# Patient Record
Sex: Female | Born: 1990 | Hispanic: Yes | Marital: Married | Smoking: Never smoker
Health system: Southern US, Community
[De-identification: ages and names within clinical notes are randomized; demographics above are authoritative.]

## PROBLEM LIST (undated history)

## (undated) ENCOUNTER — Inpatient Hospital Stay (HOSPITAL_COMMUNITY): Payer: Self-pay

## (undated) DIAGNOSIS — R519 Headache, unspecified: Secondary | ICD-10-CM

## (undated) DIAGNOSIS — K219 Gastro-esophageal reflux disease without esophagitis: Secondary | ICD-10-CM

## (undated) DIAGNOSIS — O24419 Gestational diabetes mellitus in pregnancy, unspecified control: Secondary | ICD-10-CM

## (undated) DIAGNOSIS — D219 Benign neoplasm of connective and other soft tissue, unspecified: Secondary | ICD-10-CM

## (undated) DIAGNOSIS — R51 Headache: Secondary | ICD-10-CM

## (undated) HISTORY — PX: NO PAST SURGERIES: SHX2092

---
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
## 2008-01-24 ENCOUNTER — Emergency Department (HOSPITAL_COMMUNITY): Admission: EM | Admit: 2008-01-24 | Discharge: 2008-01-24 | Payer: Self-pay | Admitting: Family Medicine

## 2008-11-18 ENCOUNTER — Emergency Department (HOSPITAL_COMMUNITY): Admission: EM | Admit: 2008-11-18 | Discharge: 2008-11-18 | Payer: Self-pay | Admitting: Emergency Medicine

## 2008-11-18 ENCOUNTER — Emergency Department (HOSPITAL_COMMUNITY): Admission: EM | Admit: 2008-11-18 | Discharge: 2008-11-19 | Payer: Self-pay | Admitting: Emergency Medicine

## 2009-02-03 ENCOUNTER — Emergency Department (HOSPITAL_COMMUNITY): Admission: EM | Admit: 2009-02-03 | Discharge: 2009-02-03 | Payer: Self-pay | Admitting: Emergency Medicine

## 2009-03-30 ENCOUNTER — Emergency Department (HOSPITAL_COMMUNITY): Admission: EM | Admit: 2009-03-30 | Discharge: 2009-03-30 | Payer: Self-pay | Admitting: Emergency Medicine

## 2009-10-18 ENCOUNTER — Inpatient Hospital Stay (HOSPITAL_COMMUNITY): Admission: AD | Admit: 2009-10-18 | Discharge: 2009-10-19 | Payer: Self-pay | Admitting: Obstetrics and Gynecology

## 2009-10-18 ENCOUNTER — Ambulatory Visit: Payer: Self-pay | Admitting: Obstetrics and Gynecology

## 2010-01-14 ENCOUNTER — Emergency Department (HOSPITAL_COMMUNITY): Admission: EM | Admit: 2010-01-14 | Discharge: 2010-01-14 | Payer: Self-pay | Admitting: Family Medicine

## 2010-05-25 ENCOUNTER — Inpatient Hospital Stay (INDEPENDENT_AMBULATORY_CARE_PROVIDER_SITE_OTHER)
Admission: RE | Admit: 2010-05-25 | Discharge: 2010-05-25 | Disposition: A | Discharge status: Home / Self Care | Payer: Self-pay | Source: Ambulatory Visit | Attending: Family Medicine | Admitting: Family Medicine

## 2010-05-25 DIAGNOSIS — J029 Acute pharyngitis, unspecified: Secondary | ICD-10-CM

## 2010-06-25 LAB — POCT PREGNANCY, URINE: Preg Test, Ur: NEGATIVE

## 2010-06-28 LAB — POCT PREGNANCY, URINE: Preg Test, Ur: NEGATIVE

## 2010-06-28 LAB — URINALYSIS, ROUTINE W REFLEX MICROSCOPIC
Glucose, UA: NEGATIVE mg/dL
Ketones, ur: NEGATIVE mg/dL
Leukocytes, UA: NEGATIVE
Protein, ur: NEGATIVE mg/dL

## 2010-06-28 LAB — GC/CHLAMYDIA PROBE AMP, GENITAL
Chlamydia, DNA Probe: NEGATIVE
GC Probe Amp, Genital: NEGATIVE

## 2010-06-28 LAB — CBC
MCH: 32.7 pg (ref 26.0–34.0)
MCHC: 34.4 g/dL (ref 30.0–36.0)
MCV: 95.2 fL (ref 78.0–100.0)
Platelets: 192 10*3/uL (ref 150–400)

## 2010-06-28 LAB — URINE MICROSCOPIC-ADD ON

## 2010-06-28 LAB — WET PREP, GENITAL
Trich, Wet Prep: NONE SEEN
Yeast Wet Prep HPF POC: NONE SEEN

## 2010-07-16 LAB — POCT I-STAT, CHEM 8
Calcium, Ion: 1.14 mmol/L (ref 1.12–1.32)
Creatinine, Ser: 0.4 mg/dL (ref 0.4–1.2)
Glucose, Bld: 104 mg/dL — ABNORMAL HIGH (ref 70–99)
HCT: 45 % (ref 36.0–46.0)
Hemoglobin: 15.3 g/dL — ABNORMAL HIGH (ref 12.0–15.0)

## 2010-07-18 LAB — URINALYSIS, ROUTINE W REFLEX MICROSCOPIC
Glucose, UA: NEGATIVE mg/dL
Hgb urine dipstick: NEGATIVE
Protein, ur: NEGATIVE mg/dL
Urobilinogen, UA: 2 mg/dL — ABNORMAL HIGH (ref 0.0–1.0)

## 2010-07-18 LAB — COMPREHENSIVE METABOLIC PANEL
ALT: 20 U/L (ref 0–35)
AST: 12 U/L (ref 0–37)
Albumin: 3.9 g/dL (ref 3.5–5.2)
Alkaline Phosphatase: 80 U/L (ref 39–117)
BUN: 16 mg/dL (ref 6–23)
Chloride: 108 mEq/L (ref 96–112)
Potassium: 3.6 mEq/L (ref 3.5–5.1)
Sodium: 139 mEq/L (ref 135–145)
Total Bilirubin: 1.2 mg/dL (ref 0.3–1.2)
Total Protein: 6.7 g/dL (ref 6.0–8.3)

## 2010-07-18 LAB — DIFFERENTIAL
Basophils Absolute: 0 10*3/uL (ref 0.0–0.1)
Basophils Relative: 0 % (ref 0–1)
Eosinophils Absolute: 0 10*3/uL (ref 0.0–0.7)
Eosinophils Relative: 1 % (ref 0–5)
Monocytes Absolute: 0.4 10*3/uL (ref 0.1–1.0)
Monocytes Relative: 4 % (ref 3–12)
Neutro Abs: 8.4 10*3/uL — ABNORMAL HIGH (ref 1.7–7.7)

## 2010-07-18 LAB — CBC
HCT: 40.9 % (ref 36.0–46.0)
Platelets: 143 10*3/uL — ABNORMAL LOW (ref 150–400)
RDW: 12.8 % (ref 11.5–15.5)
WBC: 9.5 10*3/uL (ref 4.0–10.5)

## 2010-07-18 LAB — GC/CHLAMYDIA PROBE AMP, GENITAL: Chlamydia, DNA Probe: NEGATIVE

## 2010-07-18 LAB — POCT URINALYSIS DIP (DEVICE)
Hgb urine dipstick: NEGATIVE
Ketones, ur: NEGATIVE mg/dL
Nitrite: NEGATIVE
Protein, ur: NEGATIVE mg/dL
pH: 5.5 (ref 5.0–8.0)

## 2010-07-18 LAB — WET PREP, GENITAL
Clue Cells Wet Prep HPF POC: NONE SEEN
WBC, Wet Prep HPF POC: NONE SEEN
Yeast Wet Prep HPF POC: NONE SEEN

## 2010-08-31 ENCOUNTER — Inpatient Hospital Stay (HOSPITAL_COMMUNITY)
Admission: AD | Admit: 2010-08-31 | Discharge: 2010-08-31 | Disposition: A | Discharge status: Home / Self Care | Payer: Self-pay | Source: Ambulatory Visit | Attending: Obstetrics and Gynecology | Admitting: Obstetrics and Gynecology

## 2010-08-31 DIAGNOSIS — R109 Unspecified abdominal pain: Secondary | ICD-10-CM | POA: Insufficient documentation

## 2010-08-31 LAB — URINALYSIS, ROUTINE W REFLEX MICROSCOPIC
Bilirubin Urine: NEGATIVE
Glucose, UA: NEGATIVE mg/dL
Nitrite: NEGATIVE
Specific Gravity, Urine: 1.02 (ref 1.005–1.030)
pH: 8.5 — ABNORMAL HIGH (ref 5.0–8.0)

## 2010-08-31 LAB — POCT PREGNANCY, URINE: Preg Test, Ur: NEGATIVE

## 2010-09-01 ENCOUNTER — Inpatient Hospital Stay (HOSPITAL_COMMUNITY): Payer: Self-pay

## 2010-09-01 ENCOUNTER — Inpatient Hospital Stay (HOSPITAL_COMMUNITY)
Admission: AD | Admit: 2010-09-01 | Discharge: 2010-09-01 | Disposition: A | Discharge status: Home / Self Care | Payer: Self-pay | Source: Ambulatory Visit | Attending: Obstetrics & Gynecology | Admitting: Obstetrics & Gynecology

## 2010-09-01 DIAGNOSIS — B3731 Acute candidiasis of vulva and vagina: Secondary | ICD-10-CM | POA: Insufficient documentation

## 2010-09-01 DIAGNOSIS — R109 Unspecified abdominal pain: Secondary | ICD-10-CM | POA: Insufficient documentation

## 2010-09-01 DIAGNOSIS — B373 Candidiasis of vulva and vagina: Secondary | ICD-10-CM | POA: Insufficient documentation

## 2010-09-01 LAB — WET PREP, GENITAL
Clue Cells Wet Prep HPF POC: NONE SEEN
Trich, Wet Prep: NONE SEEN

## 2010-09-01 LAB — DIFFERENTIAL
Basophils Absolute: 0 10*3/uL (ref 0.0–0.1)
Basophils Relative: 0 % (ref 0–1)
Eosinophils Relative: 4 % (ref 0–5)
Monocytes Absolute: 0.5 10*3/uL (ref 0.1–1.0)
Neutro Abs: 4.1 10*3/uL (ref 1.7–7.7)

## 2010-09-01 LAB — CBC
MCHC: 33.6 g/dL (ref 30.0–36.0)
RDW: 13.4 % (ref 11.5–15.5)
WBC: 7.1 10*3/uL (ref 4.0–10.5)

## 2010-09-02 LAB — GC/CHLAMYDIA PROBE AMP, GENITAL
Chlamydia, DNA Probe: NEGATIVE
GC Probe Amp, Genital: NEGATIVE

## 2010-10-24 ENCOUNTER — Inpatient Hospital Stay (INDEPENDENT_AMBULATORY_CARE_PROVIDER_SITE_OTHER)
Admission: RE | Admit: 2010-10-24 | Discharge: 2010-10-24 | Disposition: A | Discharge status: Home / Self Care | Payer: Self-pay | Source: Ambulatory Visit | Attending: Emergency Medicine | Admitting: Emergency Medicine

## 2010-10-24 DIAGNOSIS — N390 Urinary tract infection, site not specified: Secondary | ICD-10-CM

## 2010-10-24 LAB — WET PREP, GENITAL

## 2010-10-24 LAB — POCT URINALYSIS DIP (DEVICE)
Nitrite: NEGATIVE
Protein, ur: 30 mg/dL — AB
Urobilinogen, UA: 1 mg/dL (ref 0.0–1.0)

## 2010-10-26 LAB — GC/CHLAMYDIA PROBE AMP, GENITAL
Chlamydia, DNA Probe: NEGATIVE
GC Probe Amp, Genital: NEGATIVE

## 2010-10-30 ENCOUNTER — Emergency Department (HOSPITAL_COMMUNITY)
Admission: EM | Admit: 2010-10-30 | Discharge: 2010-10-30 | Disposition: A | Discharge status: Home / Self Care | Payer: Self-pay | Attending: Emergency Medicine | Admitting: Emergency Medicine

## 2010-10-30 DIAGNOSIS — R109 Unspecified abdominal pain: Secondary | ICD-10-CM | POA: Insufficient documentation

## 2010-10-30 DIAGNOSIS — N390 Urinary tract infection, site not specified: Secondary | ICD-10-CM | POA: Insufficient documentation

## 2010-10-30 DIAGNOSIS — N898 Other specified noninflammatory disorders of vagina: Secondary | ICD-10-CM | POA: Insufficient documentation

## 2010-10-30 DIAGNOSIS — R300 Dysuria: Secondary | ICD-10-CM | POA: Insufficient documentation

## 2010-10-30 LAB — URINALYSIS, ROUTINE W REFLEX MICROSCOPIC
Glucose, UA: NEGATIVE mg/dL
Ketones, ur: NEGATIVE mg/dL
Nitrite: POSITIVE — AB
Protein, ur: 30 mg/dL — AB
Urobilinogen, UA: 1 mg/dL (ref 0.0–1.0)

## 2010-10-30 LAB — URINE MICROSCOPIC-ADD ON

## 2010-10-30 LAB — POCT PREGNANCY, URINE: Preg Test, Ur: NEGATIVE

## 2011-01-01 LAB — COMPREHENSIVE METABOLIC PANEL
ALT: 37 — ABNORMAL HIGH
AST: 20
CO2: 28
Chloride: 107
GFR calc Af Amer: >60
GFR calc non Af Amer: >60
Glucose, Bld: 101 — ABNORMAL HIGH
Sodium: 139
Total Bilirubin: 0.6

## 2011-01-01 LAB — DIFFERENTIAL
Basophils Absolute: 0
Basophils Relative: 1
Eosinophils Absolute: 0.3
Eosinophils Relative: 4
Neutrophils Relative %: 62

## 2011-01-01 LAB — POCT URINALYSIS DIP (DEVICE)
Bilirubin Urine: NEGATIVE
Glucose, UA: NEGATIVE
Hgb urine dipstick: NEGATIVE
Operator id: 235561
Specific Gravity, Urine: 1.015
Urobilinogen, UA: 0.2

## 2011-01-01 LAB — CBC
Hemoglobin: 14.7
MCHC: 34.3
MCV: 88.5
RBC: 4.84
WBC: 9.1

## 2011-01-07 LAB — POCT RAPID STREP A: Streptococcus, Group A Screen (Direct): NEGATIVE

## 2011-01-25 LAB — POCT URINALYSIS DIP (DEVICE)
Hgb urine dipstick: NEGATIVE
Protein, ur: NEGATIVE
Specific Gravity, Urine: 1.015
Urobilinogen, UA: 0.2
pH: 6

## 2011-01-25 LAB — POCT PREGNANCY, URINE: Preg Test, Ur: NEGATIVE

## 2011-01-25 LAB — WET PREP, GENITAL: Clue Cells Wet Prep HPF POC: NONE SEEN

## 2011-01-25 LAB — GC/CHLAMYDIA PROBE AMP, GENITAL: Chlamydia, DNA Probe: NEGATIVE

## 2011-06-14 ENCOUNTER — Emergency Department (INDEPENDENT_AMBULATORY_CARE_PROVIDER_SITE_OTHER)
Admission: EM | Admit: 2011-06-14 | Discharge: 2011-06-14 | Disposition: A | Discharge status: Home Health | Payer: Self-pay | Source: Home / Self Care

## 2011-06-14 ENCOUNTER — Encounter (HOSPITAL_COMMUNITY): Payer: Self-pay | Admitting: *Deleted

## 2011-06-14 DIAGNOSIS — N644 Mastodynia: Secondary | ICD-10-CM

## 2011-06-14 LAB — POCT PREGNANCY, URINE: Preg Test, Ur: NEGATIVE

## 2011-06-14 MED ORDER — IBUPROFEN 800 MG PO TABS: 800.0000 mg | ORAL_TABLET | Freq: Three times a day (TID) | ORAL | Status: AC

## 2011-06-14 NOTE — ED Notes (Signed)
Called to [facility] to evaluate chest pain.  Pt describes severe tenderness and pain in her breasts - c/o painful movements and tenderness to palpation.  States feels like "when you have milk after you have a baby".

## 2011-06-14 NOTE — ED Notes (Signed)
Denies any injuries, discharge, unusual activity, lesions, or redness.  Denies any chest wall pain, only c/o bilat breast pain.  Has taken Advil.

## 2011-06-14 NOTE — ED Provider Notes (Signed)
History     CSN: [id number]  Arrival date & time 06/14/11  1800   First MD Initiated Contact with Patient 06/14/11 1811      Chief Complaint  Patient presents with  . Breast Pain    (Consider location/radiation/quality/duration/timing/severity/associated sxs/prior treatment) HPI Comments: Patient is a 21 yo hispanic female with complaints of bilateral breast tenderness for the last 8 days. She states that it started with her right breast and 2 days later he began in her left breast also. She states it feels like when her breasts were full with breast milk after discontinuing breastfeeding. No nipple discharge, redness or mass. Her breasts feel better when supported, and she took one dose of over the counter Ibuprofen with mild relief. Her last menstrual period was 15 months ago. She had her second Implanon inserted 15 months ago. She denies any vaginal bleeding or spotting. She discontinued caffeinated sodas a couple months ago. She has been taking herbal life teas for approximately one year but states that this is also caffeine free. She is currently uninsured and does not have a PCP. She is asking where she can go for her yearly physical exam.    History reviewed. No pertinent past medical history.  History reviewed. No pertinent past surgical history.  No family history on file.  History  Substance Use Topics  . Smoking status: Never Smoker   . Smokeless tobacco: Not on file  . Alcohol Use: No    OB History    Grav Para Term Preterm Abortions TAB SAB Ect Mult Living                  Review of Systems  Constitutional: Negative for fever and chills.  Skin: Negative for color change and rash.    Allergies  Review of patient's allergies indicates no known allergies.  Home Medications   Current Outpatient Rx  Name Route Sig Dispense Refill  . IBUPROFEN 800 MG PO TABS Oral Take 1 tablet (800 mg total) by mouth 3 (three) times daily. 15 tablet 0    BP 116/73  Pulse 64   Temp(Src) 99 F (37.2 C) (Oral)  Resp 16  SpO2 99%  Physical Exam  Nursing note and vitals reviewed. Constitutional: She appears well-developed and well-nourished. No distress.  HENT:  Head: Normocephalic and atraumatic.  Pulmonary/Chest: Right breast exhibits tenderness (diffuse, mild). Right breast exhibits no inverted nipple, no mass, no nipple discharge and no skin change. Left breast exhibits tenderness (diffuse, mild). Left breast exhibits no inverted nipple, no mass, no nipple discharge and no skin change. Breasts are symmetrical.  Lymphadenopathy:    She has no axillary adenopathy.  Neurological: She is alert.  Skin: Skin is warm and dry.  Psychiatric: She has a normal mood and affect.    ED Course  Procedures (including critical care time)   Labs Reviewed  POCT PREGNANCY, URINE   No results found.   1. Mastalgia       MDM  Urine preg neg.         Melody Comas, [place] 06/14/11 1944

## 2011-06-14 NOTE — Discharge Instructions (Signed)
Take prescription Ibuprofen as needed for your breast pain. Continue wearing a supportive bra as needed for discomfort. You may also wear this while sleeping if needed. Avoid caffeine from any sources, including beverages. Your discomfort should begin to improve in a few days. Return or see a primary care dr if you are not beginning to see improvement in one week.

## 2011-06-15 NOTE — ED Provider Notes (Signed)
Medical screening examination/treatment/procedure(s) were performed by resident physician or non-physician practitioner and as supervising physician I was immediately available for consultation/collaboration.   Barkley Bruns MD.    Barkley Bruns, MD 06/15/11 [phone]

## 2011-08-17 ENCOUNTER — Encounter (HOSPITAL_COMMUNITY): Payer: Self-pay

## 2011-08-17 ENCOUNTER — Emergency Department (INDEPENDENT_AMBULATORY_CARE_PROVIDER_SITE_OTHER)
Admission: EM | Admit: 2011-08-17 | Discharge: 2011-08-17 | Disposition: A | Discharge status: Home / Self Care | Payer: Self-pay | Source: Home / Self Care | Attending: Family Medicine | Admitting: Family Medicine

## 2011-08-17 ENCOUNTER — Emergency Department (INDEPENDENT_AMBULATORY_CARE_PROVIDER_SITE_OTHER): Payer: Self-pay

## 2011-08-17 DIAGNOSIS — J309 Allergic rhinitis, unspecified: Secondary | ICD-10-CM

## 2011-08-17 DIAGNOSIS — J302 Other seasonal allergic rhinitis: Secondary | ICD-10-CM

## 2011-08-17 MED ORDER — GUAIFENESIN-CODEINE 100-10 MG/5ML PO SYRP: 5.0000 mL | ORAL_SOLUTION | Freq: Three times a day (TID) | ORAL | Status: AC | PRN

## 2011-08-17 MED ORDER — CETIRIZINE HCL 10 MG PO TABS: 10.0000 mg | ORAL_TABLET | Freq: Every day | ORAL | Status: DC

## 2011-08-17 MED ORDER — FLUTICASONE PROPIONATE 50 MCG/ACT NA SUSP: 1.0000 | Freq: Two times a day (BID) | NASAL | Status: DC

## 2011-08-17 NOTE — ED Provider Notes (Signed)
History     CSN: [id number]  Arrival date & time 08/17/11  4098   First MD Initiated Contact with Patient 08/17/11 [phone]      Chief Complaint  Patient presents with  . Cough    (Consider location/radiation/quality/duration/timing/severity/associated sxs/prior treatment) Patient is a 21 y.o. female presenting with cough. The history is provided by the patient.  Cough This is a new problem. The current episode started more than 2 days ago. The problem has been gradually worsening. The cough is productive of sputum. The maximum temperature recorded prior to her arrival was 100 to 100.9 F. The fever has been present for less than 1 day. Associated symptoms include rhinorrhea and sore throat. Pertinent negatives include no wheezing. She is not a smoker.    History reviewed. No pertinent past medical history.  History reviewed. No pertinent past surgical history.  No family history on file.  History  Substance Use Topics  . Smoking status: Never Smoker   . Smokeless tobacco: Not on file  . Alcohol Use: No    OB History    Grav Para Term Preterm Abortions TAB SAB Ect Mult Living                  Review of Systems  Constitutional: Negative.   HENT: Positive for congestion, sore throat, rhinorrhea and postnasal drip.   Respiratory: Positive for cough. Negative for wheezing.   Cardiovascular: Negative.   Gastrointestinal: Negative.     Allergies  Review of patient's allergies indicates no known allergies.  Home Medications   Current Outpatient Rx  Name Route Sig Dispense Refill  . CETIRIZINE HCL 10 MG PO TABS Oral Take 1 tablet (10 mg total) by mouth daily. One tab daily for allergies 30 tablet 1  . FLUTICASONE PROPIONATE 50 MCG/ACT NA SUSP Nasal Place 1 spray into the nose 2 (two) times daily. 1 g 2  . GUAIFENESIN-CODEINE 100-10 MG/5ML PO SYRP Oral Take 5 mLs by mouth 3 (three) times daily as needed for cough. 120 mL 0    BP 109/65  Pulse 82  Temp(Src) 98.4 F (36.9  C) (Oral)  Resp 20  SpO2 99%  Physical Exam  Nursing note and vitals reviewed. Constitutional: She is oriented to person, place, and time. She appears well-developed and well-nourished.  HENT:  Head: Normocephalic.  Right Ear: External ear normal.  Left Ear: External ear normal.  Nose: Nose normal.  Mouth/Throat: Oropharynx is clear and moist.  Eyes: Conjunctivae and EOM are normal. Pupils are equal, round, and reactive to light.  Neck: Normal range of motion. Neck supple.  Cardiovascular: Normal rate and normal heart sounds.   Pulmonary/Chest: Breath sounds normal. She has no wheezes. She has no rales.  Lymphadenopathy:    She has no cervical adenopathy.  Neurological: She is alert and oriented to person, place, and time.  Skin: Skin is warm and dry.    ED Course  Procedures (including critical care time)  Labs Reviewed - No data to display Dg Chest 2 View  08/17/2011  *RADIOLOGY REPORT*  Clinical Data: Cough, fever  CHEST - 2 VIEW  Comparison: 02/03/2009  Findings: Cardiomediastinal silhouette is stable.  No acute infiltrate or pleural effusion.  No pulmonary edema.  Bony thorax is stable.  IMPRESSION: No active disease.  No significant change.  Original Report Authenticated By: Natasha Mead, M.D.     1. Seasonal allergies       MDM  X-rays reviewed and report per radiologist.  Linna Hoff, MD 08/17/11 [phone]

## 2011-08-17 NOTE — ED Notes (Signed)
C/o head congestion, sore throat, persistent cough and chest hurts with coughing for 2 days.  Subjective fever and states she coughs until she vomits.

## 2013-06-30 ENCOUNTER — Inpatient Hospital Stay (HOSPITAL_COMMUNITY): Payer: Medicaid Other

## 2013-06-30 ENCOUNTER — Inpatient Hospital Stay (HOSPITAL_COMMUNITY)
Admission: AD | Admit: 2013-06-30 | Discharge: 2013-07-01 | Disposition: A | Discharge status: Home / Self Care | Payer: Self-pay | Source: Ambulatory Visit | Attending: Obstetrics & Gynecology | Admitting: Obstetrics & Gynecology

## 2013-06-30 ENCOUNTER — Encounter (HOSPITAL_COMMUNITY): Payer: Self-pay | Admitting: *Deleted

## 2013-06-30 DIAGNOSIS — N8300 Follicular cyst of ovary, unspecified side: Secondary | ICD-10-CM | POA: Insufficient documentation

## 2013-06-30 DIAGNOSIS — K589 Irritable bowel syndrome without diarrhea: Secondary | ICD-10-CM

## 2013-06-30 DIAGNOSIS — M79609 Pain in unspecified limb: Secondary | ICD-10-CM | POA: Insufficient documentation

## 2013-06-30 DIAGNOSIS — R209 Unspecified disturbances of skin sensation: Secondary | ICD-10-CM | POA: Insufficient documentation

## 2013-06-30 DIAGNOSIS — R109 Unspecified abdominal pain: Secondary | ICD-10-CM | POA: Insufficient documentation

## 2013-06-30 LAB — URINALYSIS, ROUTINE W REFLEX MICROSCOPIC
BILIRUBIN URINE: NEGATIVE
GLUCOSE, UA: NEGATIVE mg/dL
Hgb urine dipstick: NEGATIVE
KETONES UR: NEGATIVE mg/dL
LEUKOCYTES UA: NEGATIVE
Nitrite: NEGATIVE
PH: 6 (ref 5.0–8.0)
Protein, ur: NEGATIVE mg/dL
SPECIFIC GRAVITY, URINE: 1.025 (ref 1.005–1.030)
Urobilinogen, UA: 1 mg/dL (ref 0.0–1.0)

## 2013-06-30 LAB — CBC
HCT: 39.7 % (ref 36.0–46.0)
Hemoglobin: 14.1 g/dL (ref 12.0–15.0)
MCH: 32.4 pg (ref 26.0–34.0)
MCHC: 35.5 g/dL (ref 30.0–36.0)
MCV: 91.3 fL (ref 78.0–100.0)
Platelets: 201 10*3/uL (ref 150–400)
RBC: 4.35 MIL/uL (ref 3.87–5.11)
RDW: 12.6 % (ref 11.5–15.5)
WBC: 8.7 10*3/uL (ref 4.0–10.5)

## 2013-06-30 LAB — WET PREP, GENITAL
Clue Cells Wet Prep HPF POC: NONE SEEN
TRICH WET PREP: NONE SEEN
Yeast Wet Prep HPF POC: NONE SEEN

## 2013-06-30 LAB — POCT PREGNANCY, URINE: PREG TEST UR: NEGATIVE

## 2013-06-30 MED ORDER — KETOROLAC TROMETHAMINE 60 MG/2ML IM SOLN
60.0000 mg | Freq: Once | INTRAMUSCULAR | Status: AC
  Administered 2013-07-01: 60 mg via INTRAMUSCULAR
  Filled 2013-06-30: qty 2

## 2013-06-30 NOTE — MAU Provider Note (Signed)
History     CSN: [id number]  Arrival date and time: 06/30/13 2216   First Provider Initiated Contact with Patient 06/30/13 2321      Chief Complaint  Patient presents with  . Abdominal Pain  . Numbness   HPI [name] is a 23 y.o. [phone] who presents to MAU today with complaint of lower abdominal pain x 2 days. The patient states that she is having a white discharge with foul odor. She denies vaginal bleeding or UTI symptoms. She states fever of 101.7 earlier this week that resolved with Tylenol. The patient also endorses intermittent loose stools and constipation. She also complains of left arm pain and numbness near the site of her Implanon. This was scheduled to be removed in 02/2013 but patient does not have insurance and has been having difficulty finding a provider to remove it. She states that pain started ~ 1 week ago.   OB History   Grav Para Term Preterm Abortions TAB SAB Ect Mult Living   2 2 2       2       Past Medical History  Diagnosis Date  . Medical history non-contributory     Past Surgical History  Procedure Laterality Date  . Tonsillectomy    . Eye surgery    . Laparoscopic gastric banding      Family History  Problem Relation Age of Onset  . Diabetes Mother   . Heart disease Mother     History  Substance Use Topics  . Smoking status: Never Smoker   . Smokeless tobacco: Not on file  . Alcohol Use: No    Allergies: No Known Allergies  Prescriptions prior to admission  Medication Sig Dispense Refill  . cetirizine (ZYRTEC) 10 MG tablet Take 1 tablet (10 mg total) by mouth daily. One tab daily for allergies  30 tablet  1  . fluticasone (FLONASE) 50 MCG/ACT nasal spray Place 1 spray into the nose 2 (two) times daily.  1 g  2    Review of Systems  Constitutional: Positive for fever. Negative for malaise/fatigue.  Gastrointestinal: Positive for abdominal pain and diarrhea. Negative for nausea, vomiting and constipation.   Genitourinary: Negative for dysuria, urgency and frequency.       + vaginal discharge Neg - vaginal bleeding   Physical Exam   Blood pressure 134/73, pulse 63, temperature 98.4 F (36.9 C), resp. rate 20, height 5' (1.524 m), weight 177 lb (80.287 kg), SpO2 100.00%.  Physical Exam  Constitutional: She is oriented to person, place, and time. She appears well-developed and well-nourished. No distress.  HENT:  Head: Normocephalic and atraumatic.  Cardiovascular: Normal rate.   Respiratory: Effort normal.  GI: Soft. She exhibits no distension. There is tenderness (mild to moderate lower abdominal tenderness to palpation).  Genitourinary: Uterus is not enlarged and not tender. Cervix exhibits no motion tenderness, no discharge and no friability. Right adnexum displays tenderness. Right adnexum displays no mass. Left adnexum displays no mass and no tenderness. No bleeding around the vagina. Vaginal discharge (small amount of thin, white discharge noted) found.  Neurological: She is alert and oriented to person, place, and time.  Skin: Skin is warm and dry. No erythema.  Psychiatric: She has a normal mood and affect.    Results for orders placed during the hospital encounter of 06/30/13 (from the past 24 hour(s))  URINALYSIS, ROUTINE W REFLEX MICROSCOPIC     Status: None   Collection Time    06/30/13 10:46 PM  Result Value Ref Range   Color, Urine YELLOW  YELLOW   APPearance CLEAR  CLEAR   Specific Gravity, Urine 1.025  1.005 - 1.030   pH 6.0  5.0 - 8.0   Glucose, UA NEGATIVE  NEGATIVE mg/dL   Hgb urine dipstick NEGATIVE  NEGATIVE   Bilirubin Urine NEGATIVE  NEGATIVE   Ketones, ur NEGATIVE  NEGATIVE mg/dL   Protein, ur NEGATIVE  NEGATIVE mg/dL   Urobilinogen, UA 1.0  0.0 - 1.0 mg/dL   Nitrite NEGATIVE  NEGATIVE   Leukocytes, UA NEGATIVE  NEGATIVE  POCT PREGNANCY, URINE     Status: None   Collection Time    06/30/13 11:15 PM      Result Value Ref Range   Preg Test, Ur  NEGATIVE  NEGATIVE  WET PREP, GENITAL     Status: Abnormal   Collection Time    06/30/13 11:25 PM      Result Value Ref Range   Yeast Wet Prep HPF POC NONE SEEN  NONE SEEN   Trich, Wet Prep NONE SEEN  NONE SEEN   Clue Cells Wet Prep HPF POC NONE SEEN  NONE SEEN   WBC, Wet Prep HPF POC FEW (*) NONE SEEN  CBC     Status: None   Collection Time    06/30/13 11:35 PM      Result Value Ref Range   WBC 8.7  4.0 - 10.5 K/uL   RBC 4.35  3.87 - 5.11 MIL/uL   Hemoglobin 14.1  12.0 - 15.0 g/dL   HCT 39.7  36.0 - 46.0 %   MCV 91.3  78.0 - 100.0 fL   MCH 32.4  26.0 - 34.0 pg   MCHC 35.5  30.0 - 36.0 g/dL   RDW 12.6  11.5 - 15.5 %   Platelets 201  150 - 400 K/uL   US Transvaginal Non-ob  07/01/2013   CLINICAL DATA:  Two day history of right lower quadrant abdominal pain and right-sided pelvic pain. G2 P2. Patient with implantable contraceptive, therefore unknown LMP.  EXAM: TRANSABDOMINAL AND TRANSVAGINAL ULTRASOUND OF PELVIS  TECHNIQUE: Both transabdominal and transvaginal ultrasound examinations of the pelvis were performed. Transabdominal technique was performed for global imaging of the pelvis including uterus, ovaries, adnexal regions, and pelvic cul-de-sac. It was necessary to proceed with endovaginal exam following the transabdominal exam to visualize the endometrium and ovaries as the bladder was incompletely distended.  COMPARISON:  US TRANSVAGINAL NON-OB dated 09/01/2010; US PELVIS COMPLETE dated 09/01/2010; US TRANSVAGINAL NON-OB dated 10/19/2009; US PELVIS COMPLETE dated 10/19/2009  FINDINGS: Uterus  Measurements: Approximately 8.7 x 3.5 x 3.9 cm. Homogeneous echotexture without focal fibroid or other myometrial abnormality. Normal-appearing uterine cervix.  Endometrium  Thickness: Approximately 3 mm. Normal appearance without evidence of endometrial fluid or mass.  Right ovary  Measurements: Approximately 3.0 x 1.9 x 1.8 cm. Small follicular cysts. No dominant cyst or solid mass. Normal color  Doppler flow within the ovary.  Left ovary  Measurements: Approximately 3.2 x 2.2 x 2.7 cm. Small follicular cysts. No dominant cyst or solid mass. Normal color Doppler flow within the ovary.  Other findings  No free fluid.  IMPRESSION: Normal examination.   Electronically Signed   By: Evangeline Dakin M.D.   On: 07/01/2013 00:42   US Pelvis Complete  07/01/2013   CLINICAL DATA:  Two day history of right lower quadrant abdominal pain and right-sided pelvic pain. G2 P2. Patient with implantable contraceptive, therefore unknown LMP.  EXAM: TRANSABDOMINAL AND  TRANSVAGINAL ULTRASOUND OF PELVIS  TECHNIQUE: Both transabdominal and transvaginal ultrasound examinations of the pelvis were performed. Transabdominal technique was performed for global imaging of the pelvis including uterus, ovaries, adnexal regions, and pelvic cul-de-sac. It was necessary to proceed with endovaginal exam following the transabdominal exam to visualize the endometrium and ovaries as the bladder was incompletely distended.  COMPARISON:  US TRANSVAGINAL NON-OB dated 09/01/2010; US PELVIS COMPLETE dated 09/01/2010; US TRANSVAGINAL NON-OB dated 10/19/2009; US PELVIS COMPLETE dated 10/19/2009  FINDINGS: Uterus  Measurements: Approximately 8.7 x 3.5 x 3.9 cm. Homogeneous echotexture without focal fibroid or other myometrial abnormality. Normal-appearing uterine cervix.  Endometrium  Thickness: Approximately 3 mm. Normal appearance without evidence of endometrial fluid or mass.  Right ovary  Measurements: Approximately 3.0 x 1.9 x 1.8 cm. Small follicular cysts. No dominant cyst or solid mass. Normal color Doppler flow within the ovary.  Left ovary  Measurements: Approximately 3.2 x 2.2 x 2.7 cm. Small follicular cysts. No dominant cyst or solid mass. Normal color Doppler flow within the ovary.  Other findings  No free fluid.  IMPRESSION: Normal examination.   Electronically Signed   By: Evangeline Dakin M.D.   On: 07/01/2013 00:42     MAU Course   Procedures None  MDM UPT - negative UA, Wet prep and GC/Chlamydia today CBC and [place] today 1 mg Toradol given in MAU Mild TTP, afebrile today, denies N/V, WBC - WNL - very low suspicion for appendicitis. Precautions discussed.  Patient reports history of abnormal pap smear and ? Colposcopy ~ 2 year ago. Did not follow-up after Colposcopy. No gross abnormalities on exam. Will refer to [facility] for pap smear.   Assessment and Plan  A: History of abnormal pap smear GI issues most likely secondary to IBS  P: Discharge home Referred to [facility] for pap smear Patient given information for [facility] for removal of Implanon Patient advised to establish care with PCP for GI symptoms Patient may return to MAU as needed or if her condition were to change or worsen   Farris Has, PA-C  07/01/2013, 12:55 AM

## 2013-06-30 NOTE — MAU Note (Signed)
Rozelle Logan PA saw pt in triage

## 2013-06-30 NOTE — Progress Notes (Signed)
Some days diarrhea and some days normal and sometimes go 2 wks without BM

## 2013-06-30 NOTE — MAU Note (Signed)
Abdominal pain for 2 days. More R lower area. Cramping that comes and goes and getting worse. Strong odor to white discharge. Numbness in L arm for a wk which comes and goes. Have Implanon L upper arm. Sometimes numbness in R hand

## 2013-07-01 ENCOUNTER — Encounter (HOSPITAL_COMMUNITY): Payer: Self-pay | Admitting: *Deleted

## 2013-07-01 NOTE — Discharge Instructions (Signed)
Irritable Bowel Syndrome °Irritable Bowel Syndrome (IBS) is caused by a disturbance of normal bowel function. Other terms used are spastic colon, mucous colitis, and irritable colon. It does not require surgery, nor does it lead to cancer. There is no cure for IBS. But with proper diet, stress reduction, and medication, you will find that your problems (symptoms) will gradually disappear or improve. IBS is a common digestive disorder. It usually appears in late adolescence or early adulthood. Women develop it twice as often as men. °CAUSES  °After food has been digested and absorbed in the small intestine, waste material is moved into the colon (large intestine). In the colon, water and salts are absorbed from the undigested products coming from the small intestine. The remaining residue, or fecal material, is held for elimination. Under normal circumstances, gentle, rhythmic contractions on the bowel walls push the fecal material along the colon towards the rectum. In IBS, however, these contractions are irregular and poorly coordinated. The fecal material is either retained too long, resulting in constipation, or expelled too soon, producing diarrhea. °SYMPTOMS  °The most common symptom of IBS is pain. It is typically in the lower left side of the belly (abdomen). But it may occur anywhere in the abdomen. It can be felt as heartburn, backache, or even as a dull pain in the arms or shoulders. The pain comes from excessive bowel-muscle spasms and from the buildup of gas and fecal material in the colon. This pain: °· Can range from sharp belly (abdominal) cramps to a dull, continuous ache. °· Usually worsens soon after eating. °· Is typically relieved by having a bowel movement or passing gas. °Abdominal pain is usually accompanied by constipation. But it may also produce diarrhea. The diarrhea typically occurs right after a meal or upon arising in the morning. The stools are typically soft and watery. They are often  flecked with secretions (mucus). °Other symptoms of IBS include: °· Bloating. °· Loss of appetite. °· Heartburn. °· Feeling sick to your stomach (nausea). °· Belching °· Vomiting °· Gas. °IBS may also cause a number of symptoms that are unrelated to the digestive system: °· Fatigue. °· Headaches. °· Anxiety °· Shortness of breath °· Difficulty in concentrating. °· Dizziness. °These symptoms tend to come and go. °DIAGNOSIS  °The symptoms of IBS closely mimic the symptoms of other, more serious digestive disorders. So your caregiver may wish to perform a variety of additional tests to exclude these disorders. He/she wants to be certain of learning what is wrong (diagnosis). The nature and purpose of each test will be explained to you. °TREATMENT °A number of medications are available to help correct bowel function and/or relieve bowel spasms and abdominal pain. Among the drugs available are: °· Mild, non-irritating laxatives for severe constipation and to help restore normal bowel habits. °· Specific anti-diarrheal medications to treat severe or prolonged diarrhea. °· Anti-spasmodic agents to relieve intestinal cramps. °· Your caregiver may also decide to treat you with a mild tranquilizer or sedative during unusually stressful periods in your life. °The important thing to remember is that if any drug is prescribed for you, make sure that you take it exactly as directed. Make sure that your caregiver knows how well it worked for you. °HOME CARE INSTRUCTIONS  °· Avoid foods that are high in fat or oils. Some examples are:heavy cream, butter, frankfurters, sausage, and other fatty meats. °· Avoid foods that have a laxative effect, such as fruit, fruit juice, and dairy products. °· Cut out   carbonated drinks, chewing gum, and "gassy" foods, such as beans and cabbage. This may help relieve bloating and belching. °· Bran taken with plenty of liquids may help relieve constipation. °· Keep track of what foods seem to trigger  your symptoms. °· Avoid emotionally charged situations or circumstances that produce anxiety. °· Start or continue exercising. °· Get plenty of rest and sleep. °MAKE SURE YOU:  °· Understand these instructions. °· Will watch your condition. °· Will get help right away if you are not doing well or get worse. °Document Released: 03/29/2005 Document Revised: 06/21/2011 Document Reviewed: 11/17/2007 °[employer]® Patient Information ©2014 [employer]. ° °

## 2013-07-01 NOTE — Progress Notes (Signed)
Julie Ethier PA in earlier to discuss test results and d/c plan. Written and verbal d/c instructions given and understanding voiced. 

## 2013-07-02 LAB — GC/CHLAMYDIA PROBE AMP
CT PROBE, AMP APTIMA: NEGATIVE
GC PROBE AMP APTIMA: NEGATIVE

## 2013-10-05 ENCOUNTER — Encounter (HOSPITAL_COMMUNITY): Payer: Self-pay | Admitting: *Deleted

## 2013-10-05 ENCOUNTER — Inpatient Hospital Stay (HOSPITAL_COMMUNITY): Payer: Medicaid Other

## 2013-10-05 ENCOUNTER — Inpatient Hospital Stay (HOSPITAL_COMMUNITY)
Admission: AD | Admit: 2013-10-05 | Discharge: 2013-10-05 | Disposition: A | Discharge status: Home / Self Care | Payer: Medicaid Other | Source: Ambulatory Visit | Attending: Obstetrics and Gynecology | Admitting: Obstetrics and Gynecology

## 2013-10-05 DIAGNOSIS — O9989 Other specified diseases and conditions complicating pregnancy, childbirth and the puerperium: Principal | ICD-10-CM

## 2013-10-05 DIAGNOSIS — O210 Mild hyperemesis gravidarum: Secondary | ICD-10-CM | POA: Insufficient documentation

## 2013-10-05 DIAGNOSIS — R109 Unspecified abdominal pain: Secondary | ICD-10-CM | POA: Insufficient documentation

## 2013-10-05 DIAGNOSIS — N644 Mastodynia: Secondary | ICD-10-CM | POA: Diagnosis not present

## 2013-10-05 DIAGNOSIS — O26899 Other specified pregnancy related conditions, unspecified trimester: Secondary | ICD-10-CM

## 2013-10-05 DIAGNOSIS — O99891 Other specified diseases and conditions complicating pregnancy: Secondary | ICD-10-CM | POA: Diagnosis not present

## 2013-10-05 LAB — CBC
HCT: 36.7 % (ref 36.0–46.0)
Hemoglobin: 13.1 g/dL (ref 12.0–15.0)
MCH: 32.3 pg (ref 26.0–34.0)
MCHC: 35.7 g/dL (ref 30.0–36.0)
MCV: 90.6 fL (ref 78.0–100.0)
PLATELETS: 185 10*3/uL (ref 150–400)
RBC: 4.05 MIL/uL (ref 3.87–5.11)
RDW: 12.8 % (ref 11.5–15.5)
WBC: 9.4 10*3/uL (ref 4.0–10.5)

## 2013-10-05 LAB — ABO/RH: ABO/RH(D): A POS

## 2013-10-05 LAB — WET PREP, GENITAL
Clue Cells Wet Prep HPF POC: NONE SEEN
Trich, Wet Prep: NONE SEEN
Yeast Wet Prep HPF POC: NONE SEEN

## 2013-10-05 LAB — URINALYSIS, ROUTINE W REFLEX MICROSCOPIC
Bilirubin Urine: NEGATIVE
Glucose, UA: NEGATIVE mg/dL
Hgb urine dipstick: NEGATIVE
KETONES UR: NEGATIVE mg/dL
LEUKOCYTES UA: NEGATIVE
NITRITE: NEGATIVE
Protein, ur: NEGATIVE mg/dL
SPECIFIC GRAVITY, URINE: 1.015 (ref 1.005–1.030)
UROBILINOGEN UA: 1 mg/dL (ref 0.0–1.0)
pH: 6 (ref 5.0–8.0)

## 2013-10-05 LAB — HCG, QUANTITATIVE, PREGNANCY: HCG, BETA CHAIN, QUANT, S: 5070 m[IU]/mL — AB (ref <5)

## 2013-10-05 LAB — POCT PREGNANCY, URINE: PREG TEST UR: POSITIVE — AB

## 2013-10-05 NOTE — Discharge Instructions (Signed)

## 2013-10-05 NOTE — MAU Provider Note (Signed)
History     CSN: [id number]  Arrival date and time: 10/05/13 1657   First Provider Initiated Contact with Patient 10/05/13 1739      Chief Complaint  Patient presents with  . Breast Pain    x 1 week  . Abdominal Pain    x 1 week   HPI [name] is 23 y.o. [id number] Unknown weeks presenting with breast pain and lower abdominal pain X 1week.  The pain is intermittent--at its worse she rates 10/10 and at this time 8/10.  She has not tried anything for pain.   She had Implanon removed ?early May at [facility]  She was not using contraception.  This pregnancy was planned.  She did not have cycle between removal and now but had small amount of bleeding 5/7-used only 1 tampon.   Has dizziness in the mornings, nausea, vomited X 2 this week.  Has vaginal odor.    Past Medical History  Diagnosis Date  . Medical history non-contributory     History reviewed. No pertinent past surgical history.  Family History  Problem Relation Age of Onset  . Diabetes Mother   . Heart disease Mother     History  Substance Use Topics  . Smoking status: Never Smoker   . Smokeless tobacco: Not on file  . Alcohol Use: No    Allergies: No Known Allergies  Prescriptions prior to admission  Medication Sig Dispense Refill  . cetirizine (ZYRTEC) 10 MG tablet Take 1 tablet (10 mg total) by mouth daily. One tab daily for allergies  30 tablet  1  . fluticasone (FLONASE) 50 MCG/ACT nasal spray Place 1 spray into the nose 2 (two) times daily.  1 g  2    Review of Systems  Constitutional: Negative for fever and chills.  Gastrointestinal: Positive for nausea, vomiting and abdominal pain.  Genitourinary: Negative for dysuria, urgency, frequency and hematuria.       Negative for vaginal bleeding.  + for vaginal odor  Neurological: Negative for headaches.   Physical Exam   Height 5' (1.524 m), weight 183 lb (83.008 kg).  Physical Exam  Constitutional: She is oriented to person,  place, and time. She appears well-developed and well-nourished. No distress.  HENT:  Head: Normocephalic.  Neck: Normal range of motion.  Cardiovascular: Normal rate.   Respiratory: Effort normal.  GI: Soft. She exhibits no distension and no mass. There is no tenderness. There is no rebound and no guarding.  Genitourinary: There is no tenderness or lesion on the right labia. There is no tenderness or lesion on the left labia. Uterus is not enlarged and not tender. Cervix exhibits no motion tenderness, no discharge and no friability. Right adnexum displays no mass, no tenderness and no fullness. Left adnexum displays no mass, no tenderness and no fullness. No bleeding around the vagina. Vaginal discharge (scant white without odor) found.  Neurological: She is alert and oriented to person, place, and time.  Skin: Skin is warm and dry.  Psychiatric: She has a normal mood and affect. Her behavior is normal.   Results for orders placed during the hospital encounter of 10/05/13 (from the past 24 hour(s))  URINALYSIS, ROUTINE W REFLEX MICROSCOPIC     Status: None   Collection Time    10/05/13  5:00 PM      Result Value Ref Range   Color, Urine YELLOW  YELLOW   APPearance CLEAR  CLEAR   Specific Gravity, Urine 1.015  1.005 - 1.030  pH 6.0  5.0 - 8.0   Glucose, UA NEGATIVE  NEGATIVE mg/dL   Hgb urine dipstick NEGATIVE  NEGATIVE   Bilirubin Urine NEGATIVE  NEGATIVE   Ketones, ur NEGATIVE  NEGATIVE mg/dL   Protein, ur NEGATIVE  NEGATIVE mg/dL   Urobilinogen, UA 1.0  0.0 - 1.0 mg/dL   Nitrite NEGATIVE  NEGATIVE   Leukocytes, UA NEGATIVE  NEGATIVE  POCT PREGNANCY, URINE     Status: Abnormal   Collection Time    10/05/13  5:15 PM      Result Value Ref Range   Preg Test, Ur POSITIVE (*) NEGATIVE  CBC     Status: None   Collection Time    10/05/13  5:45 PM      Result Value Ref Range   WBC 9.4  4.0 - 10.5 K/uL   RBC 4.05  3.87 - 5.11 MIL/uL   Hemoglobin 13.1  12.0 - 15.0 g/dL   HCT 36.7   36.0 - 46.0 %   MCV 90.6  78.0 - 100.0 fL   MCH 32.3  26.0 - 34.0 pg   MCHC 35.7  30.0 - 36.0 g/dL   RDW 12.8  11.5 - 15.5 %   Platelets 185  150 - 400 K/uL  ABO/RH     Status: None   Collection Time    10/05/13  5:45 PM      Result Value Ref Range   ABO/RH(D) A POS    HCG, QUANTITATIVE, PREGNANCY     Status: Abnormal   Collection Time    10/05/13  5:45 PM      Result Value Ref Range   hCG, Beta Chain, Quant, S 5070 (*) <5 mIU/mL  WET PREP, GENITAL     Status: Abnormal   Collection Time    10/05/13  5:55 PM      Result Value Ref Range   Yeast Wet Prep HPF POC NONE SEEN  NONE SEEN   Trich, Wet Prep NONE SEEN  NONE SEEN   Clue Cells Wet Prep HPF POC NONE SEEN  NONE SEEN   WBC, Wet Prep HPF POC MODERATE (*) NONE SEEN       CLINICAL DATA: Left-sided pelvic pain. Positive pregnancy test.  EXAM:  OBSTETRIC <14 WK [place] AND TRANSVAGINAL OB US  TECHNIQUE:  Both transabdominal and transvaginal ultrasound examinations were  performed for complete evaluation of the gestation as well as the  maternal uterus, adnexal regions, and pelvic cul-de-sac.  Transvaginal technique was performed to assess early pregnancy.  COMPARISON: None.  FINDINGS:  Intrauterine gestational sac: Visualized/normal in shape.  Yolk sac: Absent.  Embryo: Absent.  Cardiac Activity: Absent.  MSD: 7.4 mm 5 w 2 d [place] EDC: 06/05/2014.  Maternal uterus/adnexae: No subchorionic hemorrhage. Corpus luteum  cyst is seen in the right ovary. Left ovary is visualized. No free  fluid.  IMPRESSION:  Single intrauterine pregnancy with gestational age of [redacted] weeks 2 days  and estimated date of confinement of 06/05/2014. No acute findings.  Electronically Signed  By: Lorin Picket M.D.  On: 10/05/2013 20:19     MAU Course  Procedures  GC/CHL culture to lab  MDM +IUGS without YS--will repeat BHCG in 48hrs.  Patient was instructed to return on 6/28 late afternoon for lab.  She was also instructed to return for  increase/severe abdominal pain or vaginal bleeding.  She may take Tylenol for lower back ache.   Assessment and Plan  A:  Abdominal pain in early pregnancy  Positive Pregnancy test     US-IUGS [redacted]w[redacted]d without YS, FP or CA      P:  Return on 6/28 for repeat BHCG      Ectopic precautions given       KEY,EVE M 10/05/2013, 5:40 PM

## 2013-10-05 NOTE — MAU Note (Signed)
Pt states she took her implant out on May 7th and has yet to have a period. C/O breast tenderness and lower mid abdominal pain.

## 2013-10-05 NOTE — Progress Notes (Signed)
Eve Key NP in earlier to discuss test results and d/c plan. Written and verbal d/c instructions given and understanding voiced.

## 2013-10-06 LAB — GC/CHLAMYDIA PROBE AMP
CT Probe RNA: NEGATIVE
GC PROBE AMP APTIMA: NEGATIVE

## 2013-10-07 ENCOUNTER — Inpatient Hospital Stay (HOSPITAL_COMMUNITY)
Admission: AD | Admit: 2013-10-07 | Discharge: 2013-10-07 | Disposition: A | Discharge status: Home / Self Care | Payer: Medicaid Other | Source: Ambulatory Visit | Attending: Obstetrics & Gynecology | Admitting: Obstetrics & Gynecology

## 2013-10-07 DIAGNOSIS — O99891 Other specified diseases and conditions complicating pregnancy: Secondary | ICD-10-CM | POA: Insufficient documentation

## 2013-10-07 DIAGNOSIS — O9989 Other specified diseases and conditions complicating pregnancy, childbirth and the puerperium: Principal | ICD-10-CM

## 2013-10-07 DIAGNOSIS — R109 Unspecified abdominal pain: Secondary | ICD-10-CM | POA: Diagnosis not present

## 2013-10-07 DIAGNOSIS — O26899 Other specified pregnancy related conditions, unspecified trimester: Secondary | ICD-10-CM

## 2013-10-07 LAB — HCG, QUANTITATIVE, PREGNANCY: HCG, BETA CHAIN, QUANT, S: 8309 m[IU]/mL — AB (ref <5)

## 2013-10-07 NOTE — MAU Provider Note (Signed)
Attestation of Attending Supervision of Advanced Practitioner (CNM/NP): Evaluation and management procedures were performed by the Advanced Practitioner under my supervision and collaboration.  I have reviewed the Advanced Practitioner's note and chart, and I agree with the management and plan.  HARRAWAY-SMITH, CAROLYN 8:49 PM

## 2013-10-07 NOTE — Discharge Instructions (Signed)

## 2013-10-07 NOTE — MAU Provider Note (Signed)
  History     CSN: [id number]  Arrival date and time: 10/07/13 1738   None     Chief Complaint  Patient presents with  . Labs Only   HPI  Pt is a G3P2002 at [redacted]w[redacted]d wks IUP here for follow-up BHCG.  Seen on 6/26 for abdominal pain.  BHCG was 5070.  Ultrasound showed an IUGS, no yolk sac or embryo.  Denies abdominal pain or vaginal bleeding.    Past Medical History  Diagnosis Date  . Medical history non-contributory     No past surgical history on file.  Family History  Problem Relation Age of Onset  . Diabetes Mother   . Heart disease Mother     History  Substance Use Topics  . Smoking status: Never Smoker   . Smokeless tobacco: Not on file  . Alcohol Use: No    Allergies: No Known Allergies  No prescriptions prior to admission    ROS Pertinent info in HPI Physical Exam   Blood pressure 125/78, pulse 72, resp. rate 18.  Physical Exam  Constitutional: She is oriented to person, place, and time. She appears well-developed and well-nourished. No distress.  HENT:  Head: Normocephalic.  Neck: Normal range of motion. Neck supple.  Neurological: She is alert and oriented to person, place, and time. She has normal reflexes.  Skin: Skin is warm and dry.    MAU Course  Procedures Results for orders placed during the hospital encounter of 10/07/13 (from the past 24 hour(s))  HCG, QUANTITATIVE, PREGNANCY     Status: Abnormal   Collection Time    10/07/13  5:51 PM      Result Value Ref Range   hCG, Beta Chain, Quant, S 8309 (*) <5 mIU/mL    Assessment and Plan  Abdominal Pain in Pregnancy  Plan:  Discharge to home Repeat ultrasound 7 days from last ultrasound (10/05/13)   Ectopic precautions Discharge to home  [facility] 10/07/2013, 7:49 PM

## 2013-10-07 NOTE — MAU Provider Note (Signed)
Attestation of Attending Supervision of Advanced Practitioner (CNM/NP): Evaluation and management procedures were performed by the Advanced Practitioner under my supervision and collaboration.  I have reviewed the Advanced Practitioner's note and chart, and I agree with the management and plan.  HARRAWAY-SMITH, CAROLYN 12:24 PM

## 2013-10-07 NOTE — MAU Note (Signed)
Pt presents to MAU for repeat BHCG. Denies any pain or vaginal bleeding

## 2013-10-11 ENCOUNTER — Ambulatory Visit (HOSPITAL_COMMUNITY)
Admission: RE | Admit: 2013-10-11 | Discharge: 2013-10-11 | Disposition: A | Discharge status: Home / Self Care | Payer: Medicaid Other | Source: Ambulatory Visit | Attending: Family | Admitting: Family

## 2013-10-11 ENCOUNTER — Other Ambulatory Visit: Payer: Self-pay | Admitting: Nurse Practitioner

## 2013-10-11 ENCOUNTER — Inpatient Hospital Stay (HOSPITAL_COMMUNITY)
Admission: AD | Admit: 2013-10-11 | Discharge: 2013-10-11 | Disposition: A | Discharge status: Home / Self Care | Payer: Medicaid Other | Source: Ambulatory Visit | Attending: Obstetrics & Gynecology | Admitting: Obstetrics & Gynecology

## 2013-10-11 ENCOUNTER — Other Ambulatory Visit (HOSPITAL_COMMUNITY): Payer: Self-pay | Admitting: Family

## 2013-10-11 DIAGNOSIS — O9989 Other specified diseases and conditions complicating pregnancy, childbirth and the puerperium: Principal | ICD-10-CM

## 2013-10-11 DIAGNOSIS — Z833 Family history of diabetes mellitus: Secondary | ICD-10-CM | POA: Insufficient documentation

## 2013-10-11 DIAGNOSIS — O26899 Other specified pregnancy related conditions, unspecified trimester: Secondary | ICD-10-CM

## 2013-10-11 DIAGNOSIS — O99891 Other specified diseases and conditions complicating pregnancy: Secondary | ICD-10-CM | POA: Insufficient documentation

## 2013-10-11 DIAGNOSIS — O208 Other hemorrhage in early pregnancy: Secondary | ICD-10-CM | POA: Diagnosis not present

## 2013-10-11 DIAGNOSIS — R109 Unspecified abdominal pain: Principal | ICD-10-CM

## 2013-10-11 DIAGNOSIS — Z3689 Encounter for other specified antenatal screening: Secondary | ICD-10-CM | POA: Diagnosis not present

## 2013-10-11 LAB — HCG, QUANTITATIVE, PREGNANCY: HCG, BETA CHAIN, QUANT, S: 13637 m[IU]/mL — AB (ref <5)

## 2013-10-11 NOTE — Discharge Instructions (Signed)

## 2013-10-11 NOTE — MAU Provider Note (Signed)
History     CSN: [id number]  Arrival date and time: 10/11/13 1142   None     Chief Complaint  Patient presents with  . Follow-up   HPI Comments: [name] 23 y.o. [id number] [redacted]w[redacted]d comes to MAU from ultrasound. She was ordinally seen on 6/26 for abdominal pain in early pregnancy. Her LMP is unsure. She had an ultrasound done that day and no GS, YS, Fetal pole were seen. She was asked to return 2 days later for repeat quant and the number was an inappropriate rise. She was then advised to repeat U/S today. The results showed a Gestational sac  but no fetal pole, YS or cardiac activity. She denies any pain or bleeding. Has some nausea only     Past Medical History  Diagnosis Date  . Medical history non-contributory     No past surgical history on file.  Family History  Problem Relation Age of Onset  . Diabetes Mother   . Heart disease Mother     History  Substance Use Topics  . Smoking status: Never Smoker   . Smokeless tobacco: Not on file  . Alcohol Use: No    Allergies: No Known Allergies  No prescriptions prior to admission    Review of Systems  Constitutional: Negative.   HENT: Negative.   Respiratory: Negative.   Cardiovascular: Negative.   Gastrointestinal: Positive for nausea, vomiting and abdominal pain.  Genitourinary: Negative.   Musculoskeletal: Negative.   Skin: Negative.   Neurological: Negative.   Psychiatric/Behavioral: Negative.    Physical Exam   Blood pressure 117/67, pulse 65, temperature 99.5 F (37.5 C), temperature source Oral, resp. rate 16, SpO2 99.00%.  Physical Exam  Constitutional: She is oriented to person, place, and time. She appears well-developed and well-nourished. No distress.  HENT:  Head: Normocephalic and atraumatic.  Eyes: Pupils are equal, round, and reactive to light.  Genitourinary:  Not examined/ in Triage  Musculoskeletal: Normal range of motion.  Neurological: She is alert and oriented to person,  place, and time.  Skin: Skin is warm and dry.  Psychiatric: She has a normal mood and affect. Her behavior is normal. Judgment and thought content normal.   US Ob Transvaginal  10/11/2013   CLINICAL DATA:  Pregnant, pain, assess viability  EXAM: TRANSVAGINAL OB ULTRASOUND  TECHNIQUE: Transvaginal ultrasound was performed for complete evaluation of the gestation as well as the maternal uterus, adnexal regions, and pelvic cul-de-sac.  COMPARISON:  10/05/2013  FINDINGS: Intrauterine gestational sac: Visualized/normal in shape.  Yolk sac:  Absent  Embryo:  Absent  Cardiac Activity: Absent  MSD: 13  mm   6 w   1  d  [place] EDC: 06/05/2014  Maternal uterus/adnexae: Small subchorionic hemorrhage. Right ovary not visualized. Left ovary appears grossly normal but is not seen well. No free fluid.  IMPRESSION: Probable early intrauterine gestational sac, but no yolk sac, fetal pole, or cardiac activity yet visualized. Recommend follow-up quantitative B-HCG levels and follow-up US in 14 days to confirm and assess viability. This recommendation follows SRU consensus guidelines: Diagnostic Criteria for Nonviable Pregnancy Early in the First Trimester. [employer] Med 2013; 993:7169-67.   Electronically Signed   By: Skipper Cliche M.D.   On: 10/11/2013 11:21   Lab Results  Component Value Date   HCGBETAQNT 13637* 10/11/2013   HCGBETAQNT 8309* 10/07/2013   HCGBETAQNT 5070* 10/05/2013      MAU Course  Procedures  MDM Spoke with Dr Roselie Awkward who advise repeat Quant today  and repeat U/S in one week  Assessment and Plan   A: Abdominal pain in early pregnancy Inappropriate rise in Quants Inappropriate growth  P: Will repeat U/S in 7 days   Follow up in MAU after U/S  [name] 10/11/2013, 12:45 PM

## 2013-10-11 NOTE — MAU Note (Signed)
Patient to MAU after ultrasound. Patient denies pain or bleeding but has a little nausea with some vomiting.

## 2013-10-18 ENCOUNTER — Inpatient Hospital Stay (HOSPITAL_COMMUNITY)
Admission: AD | Admit: 2013-10-18 | Discharge: 2013-10-18 | Disposition: A | Discharge status: Home / Self Care | Payer: Medicaid Other | Source: Ambulatory Visit | Attending: Obstetrics & Gynecology | Admitting: Obstetrics & Gynecology

## 2013-10-18 ENCOUNTER — Other Ambulatory Visit (HOSPITAL_COMMUNITY): Payer: Self-pay | Admitting: Gynecology

## 2013-10-18 ENCOUNTER — Ambulatory Visit (HOSPITAL_COMMUNITY)
Admission: RE | Admit: 2013-10-18 | Discharge: 2013-10-18 | Disposition: A | Discharge status: Home / Self Care | Payer: Medicaid Other | Source: Ambulatory Visit | Attending: Nurse Practitioner | Admitting: Nurse Practitioner

## 2013-10-18 DIAGNOSIS — O99891 Other specified diseases and conditions complicating pregnancy: Secondary | ICD-10-CM | POA: Insufficient documentation

## 2013-10-18 DIAGNOSIS — O210 Mild hyperemesis gravidarum: Secondary | ICD-10-CM | POA: Insufficient documentation

## 2013-10-18 DIAGNOSIS — O3680X0 Pregnancy with inconclusive fetal viability, not applicable or unspecified: Secondary | ICD-10-CM | POA: Insufficient documentation

## 2013-10-18 DIAGNOSIS — R109 Unspecified abdominal pain: Secondary | ICD-10-CM | POA: Insufficient documentation

## 2013-10-18 DIAGNOSIS — O26899 Other specified pregnancy related conditions, unspecified trimester: Secondary | ICD-10-CM

## 2013-10-18 DIAGNOSIS — O9989 Other specified diseases and conditions complicating pregnancy, childbirth and the puerperium: Principal | ICD-10-CM

## 2013-10-18 LAB — COMPREHENSIVE METABOLIC PANEL
ALT: 43 U/L — ABNORMAL HIGH (ref 0–35)
ANION GAP: 11 (ref 5–15)
AST: 18 U/L (ref 0–37)
Albumin: 3.5 g/dL (ref 3.5–5.2)
Alkaline Phosphatase: 59 U/L (ref 39–117)
BUN: 11 mg/dL (ref 6–23)
CO2: 26 mEq/L (ref 19–32)
CREATININE: 0.47 mg/dL — AB (ref 0.50–1.10)
Calcium: 9.2 mg/dL (ref 8.4–10.5)
Chloride: 101 mEq/L (ref 96–112)
GFR calc non Af Amer: >90 mL/min (ref >90)
GLUCOSE: 83 mg/dL (ref 70–99)
POTASSIUM: 4.3 meq/L (ref 3.7–5.3)
Sodium: 138 mEq/L (ref 137–147)
TOTAL PROTEIN: 6.7 g/dL (ref 6.0–8.3)
Total Bilirubin: 0.3 mg/dL (ref 0.3–1.2)

## 2013-10-18 LAB — CBC
HEMATOCRIT: 37.2 % (ref 36.0–46.0)
Hemoglobin: 13 g/dL (ref 12.0–15.0)
MCH: 31.9 pg (ref 26.0–34.0)
MCHC: 34.9 g/dL (ref 30.0–36.0)
MCV: 91.4 fL (ref 78.0–100.0)
PLATELETS: 190 10*3/uL (ref 150–400)
RBC: 4.07 MIL/uL (ref 3.87–5.11)
RDW: 12.5 % (ref 11.5–15.5)
WBC: 8.9 10*3/uL (ref 4.0–10.5)

## 2013-10-18 LAB — HCG, QUANTITATIVE, PREGNANCY: hCG, Beta Chain, Quant, S: 29696 m[IU]/mL — ABNORMAL HIGH (ref <5)

## 2013-10-18 NOTE — MAU Provider Note (Signed)
  History     CSN: [id number]  Arrival date and time: 10/18/13 1445   First Provider Initiated Contact with Patient 10/18/13 1540      Chief Complaint  Patient presents with  . Follow-up   HPI  Pt is here for f/u after ultrasound. IUGS seen but no YS Pt was seen initially on on 10/05/2013 with abd cramping and HCG 5070. US  Showed IUGS [redacted]w[redacted]d no YS; 10/07/2013 HCG 8309 and IUGS 6w1, no YS; 7/2/105 hcg 13, 627 and [place] IUGS [redacted]w[redacted]d no YS Pt has had some nausea and breast tenderness. This is a desired pregnancy Results for [name], [name] (MRN [id number]) as of 10/18/2013 21:34  Ref. Range 10/11/2013 11:11 10/11/2013 12:30 10/18/2013 14:18 10/18/2013 15:11 10/18/2013 15:16  hCG, Beta Chain, Quant, S Latest Range: <5 mIU/mL  13637 (H)  10932 (H)    Past Medical History  Diagnosis Date  . Medical history non-contributory     No past surgical history on file.  Family History  Problem Relation Age of Onset  . Diabetes Mother   . Heart disease Mother     History  Substance Use Topics  . Smoking status: Never Smoker   . Smokeless tobacco: Not on file  . Alcohol Use: No    Allergies: No Known Allergies  No prescriptions prior to admission    ROS Physical Exam   Blood pressure 136/69, pulse 66, temperature 99.3 F (37.4 C), temperature source Oral, resp. rate 16.  Physical Exam  Vitals reviewed. Constitutional: She is oriented to person, place, and time. She appears well-developed and well-nourished. No distress.  HENT:  Head: Normocephalic.  Eyes: Pupils are equal, round, and reactive to light.  Neck: Normal range of motion.  Cardiovascular: Normal rate.   Respiratory: Effort normal.  GI: Soft.  Musculoskeletal: Normal range of motion.  Neurological: She is alert and oriented to person, place, and time.  Skin: Skin is warm and dry.  Psychiatric: She has a normal mood and affect.    MAU Course  Procedures  Discussed with Dr. Harolyn Rutherford- not appropriate rise in HCG   Discussed with pt that we are concerned that this is not a normal pregnancy This is a desired pregnancy and option given to repeat ultrasound in 10 days- order sent to [place] to scheduled   Assessment and Plan  abd pain in pregnancy with slow rising in HCG; IUGS but no YS F/u for repeat US in 10 days- MOnd July 20- [place] to call pt for an appointment  [facility] 10/18/2013, 5:16 PM

## 2013-10-18 NOTE — MAU Note (Signed)
States feeling well. Denies pain or bleeding.  Still having nausea and vomiting and a lot of breast tenderness.

## 2013-10-19 NOTE — MAU Provider Note (Signed)
Attestation of Attending Supervision of Advanced Practitioner (PA/CNM/NP): Evaluation and management procedures were performed by the Advanced Practitioner under my supervision and collaboration.  I have reviewed the Advanced Practitioner's note and chart, and I agree with the management and plan.  Gianlucca Szymborski, MD, FACOG Attending Obstetrician & Gynecologist Faculty Practice, [facility]   

## 2013-10-23 ENCOUNTER — Inpatient Hospital Stay (HOSPITAL_COMMUNITY)
Admission: AD | Admit: 2013-10-23 | Discharge: 2013-10-23 | Disposition: A | Discharge status: Home / Self Care | Payer: Medicaid Other | Source: Ambulatory Visit | Attending: Obstetrics and Gynecology | Admitting: Obstetrics and Gynecology

## 2013-10-23 ENCOUNTER — Inpatient Hospital Stay (HOSPITAL_COMMUNITY): Payer: Medicaid Other

## 2013-10-23 ENCOUNTER — Encounter (HOSPITAL_COMMUNITY): Payer: Self-pay | Admitting: *Deleted

## 2013-10-23 DIAGNOSIS — R109 Unspecified abdominal pain: Secondary | ICD-10-CM | POA: Diagnosis not present

## 2013-10-23 DIAGNOSIS — O021 Missed abortion: Secondary | ICD-10-CM | POA: Insufficient documentation

## 2013-10-23 DIAGNOSIS — O209 Hemorrhage in early pregnancy, unspecified: Secondary | ICD-10-CM | POA: Diagnosis present

## 2013-10-23 DIAGNOSIS — O200 Threatened abortion: Secondary | ICD-10-CM | POA: Diagnosis not present

## 2013-10-23 DIAGNOSIS — O0289 Other abnormal products of conception: Secondary | ICD-10-CM | POA: Diagnosis not present

## 2013-10-23 LAB — URINE MICROSCOPIC-ADD ON

## 2013-10-23 LAB — URINALYSIS, ROUTINE W REFLEX MICROSCOPIC
BILIRUBIN URINE: NEGATIVE
Glucose, UA: NEGATIVE mg/dL
Ketones, ur: NEGATIVE mg/dL
LEUKOCYTES UA: NEGATIVE
NITRITE: NEGATIVE
Protein, ur: NEGATIVE mg/dL
SPECIFIC GRAVITY, URINE: 1.015 (ref 1.005–1.030)
UROBILINOGEN UA: 0.2 mg/dL (ref 0.0–1.0)
pH: 6.5 (ref 5.0–8.0)

## 2013-10-23 LAB — HCG, QUANTITATIVE, PREGNANCY: hCG, Beta Chain, Quant, S: 34738 m[IU]/mL — ABNORMAL HIGH (ref <5)

## 2013-10-23 MED ORDER — ACETAMINOPHEN 325 MG PO TABS
650.0000 mg | ORAL_TABLET | Freq: Once | ORAL | Status: AC
  Administered 2013-10-23: 650 mg via ORAL
  Filled 2013-10-23: qty 2

## 2013-10-23 NOTE — MAU Provider Note (Signed)
First Provider Initiated Contact with Patient 10/23/13 [phone]      Chief Complaint:  Vaginal Bleeding   [name] is  23 y.o. G3P2002 at [redacted]w[redacted]d presents complaining of vaginal bleeding and cramping. Pt has been monitored for inappropriate rise in bHCG over the last 2 weeks. Pt has had confirmed IUGS without yolk sac on 7/9. This morning pt went to restroom and had a gush of blood and painful cramping. Pt states initially red, then decreased to brown and minimal bleeding. Pt has been feeling well prior to bleeding wihtout fevers or chills, No abdominal pain or other complaints prior to onset.  Obstetrical/Gynecological History: OB History   Grav Para Term Preterm Abortions TAB SAB Ect Mult Living   3 2 2       2      Past Medical History: Past Medical History  Diagnosis Date  . Medical history non-contributory     Past Surgical History: History reviewed. No pertinent past surgical history.  Family History: Family History  Problem Relation Age of Onset  . Diabetes Mother   . Heart disease Mother     Social History: History  Substance Use Topics  . Smoking status: Never Smoker   . Smokeless tobacco: Not on file  . Alcohol Use: No    Allergies: No Known Allergies  Meds:  Prescriptions prior to admission  Medication Sig Dispense Refill  . Prenatal Vit-Fe Fumarate-FA (PRENATAL MULTIVITAMIN) TABS tablet Take 1 tablet by mouth daily at 12 noon.        Review of Systems -   Review of Systems  No CP, SOB, n/v, d/c, urianry symptoms. +Cramping, +Vaginal Bleeding, no f/c   Physical Exam  Blood pressure 124/69, pulse 73, temperature 98.9 F (37.2 C), temperature source Oral, resp. rate 20, height 5' (1.524 m), weight 81.704 kg (180 lb 2 oz). GENERAL: Well-developed, well-nourished female in no acute distress.  ABDOMEN: moderately tender suprapubic, remainder Soft, nontender, nondistended, gravid.  EXTREMITIES: Nontender, no edema, 2+ distal pulses. SSE: brown  discharge from os SVE: Closed Cervix, long thick    Results for MADAY, GUARINO (MRN [id number]) as of 10/23/2013 09:30  Ref. Range 10/07/2013 17:51 10/11/2013 12:30 10/18/2013 15:11 10/23/2013 08:41  hCG, Beta Chain, Quant, S Latest Range: <5 mIU/mL 8309 (H) 13637 (H) 10272 (H) 34738 (H)    Labs: No results found for this or any previous visit (from the past 24 hour(s)). Imaging Studies:  CLINICAL DATA: Pregnant patient. Assess fetal viability.  EXAM:  TRANSVAGINAL OB ULTRASOUND  TECHNIQUE:  Transvaginal ultrasound was performed for complete evaluation of the  gestation as well as the maternal uterus, adnexal regions, and  pelvic cul-de-sac.  COMPARISON: Ob ultrasound 10/18/2013 and 10/11/2013.  FINDINGS:  Intrauterine gestational sac: Visualized.  Yolk sac: Not visualized.  Embryo: Not visualized.  Cardiac Activity: Not applicable.  MSD: 2.4 mm 7 w 4 d [place] EDC: 06/08/2014.  Maternal uterus/adnexae: Unremarkable.  IMPRESSION:  Gestational sac without yolk sac or embryo. Findings are suspicious  but not yet definitive for failed pregnancy. Recommend follow-up US  in 10-14 days for definitive diagnosis. This recommendation follows  SRU consensus guidelines: Diagnostic Criteria for Nonviable  Pregnancy Early in the First Trimester. [employer] 2013;  536:6440-34.  Electronically Signed  By: Inge Rise M.D.  On: 10/23/2013 10:27   US Ob Comp Less 14 Wks  10/05/2013   CLINICAL DATA:  Left-sided pelvic pain.  Positive pregnancy test.  EXAM: OBSTETRIC <14 WK [place] AND TRANSVAGINAL OB US  TECHNIQUE: Both transabdominal and transvaginal ultrasound examinations were performed for complete evaluation of the gestation as well as the maternal uterus, adnexal regions, and pelvic cul-de-sac. Transvaginal technique was performed to assess early pregnancy.  COMPARISON:  None.  FINDINGS: Intrauterine gestational sac: Visualized/normal in shape.  Yolk sac:  Absent.  Embryo:  Absent.  Cardiac  Activity: Absent.  MSD:  7.4  mm   5 w   2  d              [place] EDC: 06/05/2014.  Maternal uterus/adnexae: No subchorionic hemorrhage. Corpus luteum cyst is seen in the right ovary. Left ovary is visualized. No free fluid.  IMPRESSION: Single intrauterine pregnancy with gestational age of [redacted] weeks 2 days and estimated date of confinement of 06/05/2014. No acute findings.   Electronically Signed   By: Lorin Picket M.D.   On: 10/05/2013 20:19   US Ob Transvaginal  10/18/2013   CLINICAL DATA:  Abdominal pain.  Inconclusive fetal viability.  EXAM: TRANSVAGINAL OB ULTRASOUND  TECHNIQUE: Transvaginal ultrasound was performed for complete evaluation of the gestation as well as the maternal uterus, adnexal regions, and pelvic cul-de-sac.  COMPARISON:  10/11/2013  FINDINGS: Intrauterine gestational sac: Single  Yolk sac:  No  Embryo:  No  Cardiac Activity: No  MSD: 17.6  mm   6 w   5  d             [place] EDC: 06/08/2014.  Maternal uterus/adnexae:  Subchorionic hemorrhage: None  Right ovary: Normal  Left ovary: Norma  Other :None  Free fluid:  None  IMPRESSION: 1. Single intrauterine gestational sac without yolk sac or embryo. Findings are suspicious but not yet definitive for failed pregnancy. Recommend follow-up US in 10-14 days for definitive diagnosis. This recommendation follows SRU consensus guidelines: Diagnostic Criteria for Nonviable Pregnancy Early in the First Trimester. [employer] 2013; 696:7893-81.   Electronically Signed   By: Kerby Moors M.D.   On: 10/18/2013 14:33   US Ob Transvaginal  10/11/2013   CLINICAL DATA:  Pregnant, pain, assess viability  EXAM: TRANSVAGINAL OB ULTRASOUND  TECHNIQUE: Transvaginal ultrasound was performed for complete evaluation of the gestation as well as the maternal uterus, adnexal regions, and pelvic cul-de-sac.  COMPARISON:  10/05/2013  FINDINGS: Intrauterine gestational sac: Visualized/normal in shape.  Yolk sac:  Absent  Embryo:  Absent  Cardiac Activity: Absent  MSD: 13   mm   6 w   1  d  [place] EDC: 06/05/2014  Maternal uterus/adnexae: Small subchorionic hemorrhage. Right ovary not visualized. Left ovary appears grossly normal but is not seen well. No free fluid.  IMPRESSION: Probable early intrauterine gestational sac, but no yolk sac, fetal pole, or cardiac activity yet visualized. Recommend follow-up quantitative B-HCG levels and follow-up US in 14 days to confirm and assess viability. This recommendation follows SRU consensus guidelines: Diagnostic Criteria for Nonviable Pregnancy Early in the First Trimester. [employer] 2013; 017:5102-58.   Electronically Signed   By: Skipper Cliche M.D.   On: 10/11/2013 11:21   US Ob Transvaginal  10/05/2013   CLINICAL DATA:  Left-sided pelvic pain.  Positive pregnancy test.  EXAM: OBSTETRIC <14 WK [place] AND TRANSVAGINAL OB US  TECHNIQUE: Both transabdominal and transvaginal ultrasound examinations were performed for complete evaluation of the gestation as well as the maternal uterus, adnexal regions, and pelvic cul-de-sac. Transvaginal technique was performed to assess early pregnancy.  COMPARISON:  None.  FINDINGS: Intrauterine gestational sac: Visualized/normal in  shape.  Yolk sac:  Absent.  Embryo:  Absent.  Cardiac Activity: Absent.  MSD:  7.4  mm   5 w   2  d              [place] EDC: 06/05/2014.  Maternal uterus/adnexae: No subchorionic hemorrhage. Corpus luteum cyst is seen in the right ovary. Left ovary is visualized. No free fluid.  IMPRESSION: Single intrauterine pregnancy with gestational age of [redacted] weeks 2 days and estimated date of confinement of 06/05/2014. No acute findings.   Electronically Signed   By: Lorin Picket M.D.   On: 10/05/2013 20:19   Assessment: [name] is  23 y.o. G3P2002 at [redacted]w[redacted]d presents with threatened AB vs missed AB vs blighted ovum. Unable to definitively declare either way based on slow rise of HCG and unchanged [place]. However high concern for abnormal pregnancy given inappropriate HCG  changes.  Plan: Repeat US in 10 days to 7/24 Repeat HCG at that time Return for vaginal bleeding, severe pain, or fevers or chills.  Fredrik Rigger 7/14/20158:58 AM  Discussed with Dr. Elly Modena

## 2013-10-23 NOTE — MAU Provider Note (Signed)
Attestation of Attending Supervision of Advanced Practitioner (CNM/NP): Evaluation and management procedures were performed by the Advanced Practitioner under my supervision and collaboration.  I have reviewed the Advanced Practitioner's note and chart, and I agree with the management and plan.  Vander Kueker 10/23/2013 12:44 PM

## 2013-10-23 NOTE — Discharge Instructions (Signed)

## 2013-10-23 NOTE — MAU Note (Signed)
Pt states she started experiencing a lot of abd pain this morning at work.  When she went to the bathroom she was experiencing bright red vaginal bleeding the first time to the bathroom.  When she went back, she states her bleeding turned to a brownish bleeding.  Pt has been watched closely during this pregnancy.

## 2013-10-29 ENCOUNTER — Ambulatory Visit (HOSPITAL_COMMUNITY): Payer: Self-pay

## 2013-11-02 ENCOUNTER — Ambulatory Visit (HOSPITAL_COMMUNITY): Payer: Medicaid Other

## 2013-11-02 ENCOUNTER — Ambulatory Visit (HOSPITAL_COMMUNITY): Payer: Self-pay

## 2013-12-17 ENCOUNTER — Encounter (HOSPITAL_COMMUNITY): Payer: Self-pay

## 2013-12-17 ENCOUNTER — Inpatient Hospital Stay (HOSPITAL_COMMUNITY)
Admission: AD | Admit: 2013-12-17 | Discharge: 2013-12-17 | Disposition: A | Discharge status: Home / Self Care | Payer: Medicaid Other | Source: Ambulatory Visit | Attending: Family Medicine | Admitting: Family Medicine

## 2013-12-17 DIAGNOSIS — R1031 Right lower quadrant pain: Secondary | ICD-10-CM | POA: Insufficient documentation

## 2013-12-17 DIAGNOSIS — R1032 Left lower quadrant pain: Secondary | ICD-10-CM

## 2013-12-17 LAB — URINALYSIS, ROUTINE W REFLEX MICROSCOPIC
Bilirubin Urine: NEGATIVE
GLUCOSE, UA: NEGATIVE mg/dL
Hgb urine dipstick: NEGATIVE
Ketones, ur: NEGATIVE mg/dL
Leukocytes, UA: NEGATIVE
Nitrite: NEGATIVE
Protein, ur: NEGATIVE mg/dL
Specific Gravity, Urine: 1.015 (ref 1.005–1.030)
Urobilinogen, UA: 1 mg/dL (ref 0.0–1.0)
pH: 6.5 (ref 5.0–8.0)

## 2013-12-17 LAB — WET PREP, GENITAL
Clue Cells Wet Prep HPF POC: NONE SEEN
TRICH WET PREP: NONE SEEN
YEAST WET PREP: NONE SEEN

## 2013-12-17 LAB — HCG, QUANTITATIVE, PREGNANCY: hCG, Beta Chain, Quant, S: 2 m[IU]/mL (ref <5)

## 2013-12-17 NOTE — MAU Provider Note (Signed)
None     Chief Complaint:  Follow up SAB.  Pt states she had a SAB in [place] 7/31. (had been seen multiple times in early July in the MAU with us/hcg's that pointed towards a probable nonviable pregnancy--had to go to [place] d/t family issues). Bled for a few days.  Today she has had some left lower quadrant abdominal pain. And increased discharge. Here to "make sure everything is all right": Wants to be pregnant again  Past Medical History  Diagnosis Date  . Medical history non-contributory     History reviewed. No pertinent past surgical history.  Family History  Problem Relation Age of Onset  . Diabetes Mother   . Heart disease Mother     History  Substance Use Topics  . Smoking status: Never Smoker   . Smokeless tobacco: Not on file  . Alcohol Use: No    Allergies: No Known Allergies  Prescriptions prior to admission  Medication Sig Dispense Refill  . acetaminophen (TYLENOL) 325 MG tablet Take 325 mg by mouth every 6 (six) hours as needed for mild pain.      . Prenatal Vit-Fe Fumarate-FA (PRENATAL MULTIVITAMIN) TABS tablet Take 1 tablet by mouth daily.          Review of Systems   Constitutional: Negative for fever and chills Eyes: Negative for visual disturbances Respiratory: Negative for shortness of breath, dyspnea Cardiovascular: Negative for chest pain or palpitations  Gastrointestinal: Negative for vomiting, diarrhea and constipation Genitourinary: Negative for dysuria and urgency Musculoskeletal: Negative for back pain, joint pain, myalgias  Neurological: Negative for dizziness and headaches     Physical Exam   Blood pressure 127/69, pulse 71, temperature 99 F (37.2 C), temperature source Oral, resp. rate 16, height 5' 0.25" (1.53 m), weight 81.647 kg (180 lb), SpO2 100.00%, unknown if currently breastfeeding.  General: General appearance - alert, well appearing, and in no distress and oriented to person, place, and time Chest - LCTAB Heart - normal  rate and regular rhythm Abdomen - soft, Sl tender in LLQr, nondistended, no masses or organomegaly Pelvic - normal external genitalia, vulva, vagina, cervix, uterus and adnexa.  Discharge appears normal, clear without odor Extremities - no pedal edema noted   Labs: Results for orders placed during the hospital encounter of 12/17/13 (from the past 24 hour(s))  URINALYSIS, ROUTINE W REFLEX MICROSCOPIC   Collection Time    12/17/13  5:05 PM      Result Value Ref Range   Color, Urine YELLOW  YELLOW   APPearance CLEAR  CLEAR   Specific Gravity, Urine 1.015  1.005 - 1.030   pH 6.5  5.0 - 8.0   Glucose, UA NEGATIVE  NEGATIVE mg/dL   Hgb urine dipstick NEGATIVE  NEGATIVE   Bilirubin Urine NEGATIVE  NEGATIVE   Ketones, ur NEGATIVE  NEGATIVE mg/dL   Protein, ur NEGATIVE  NEGATIVE mg/dL   Urobilinogen, UA 1.0  0.0 - 1.0 mg/dL   Nitrite NEGATIVE  NEGATIVE   Leukocytes, UA NEGATIVE  NEGATIVE  HCG, QUANTITATIVE, PREGNANCY   Collection Time    12/17/13  5:20 PM      Result Value Ref Range   hCG, Beta Chain, Quant, S 2  <5 mIU/mL  WET PREP, GENITAL   Collection Time    12/17/13  6:00 PM      Result Value Ref Range   Yeast Wet Prep HPF POC NONE SEEN  NONE SEEN   Trich, Wet Prep NONE SEEN  NONE SEEN  Clue Cells Wet Prep HPF POC NONE SEEN  NONE SEEN   WBC, Wet Prep HPF POC FEW (*) NONE SEEN     Assessment: 5 weeks s/t SAB, normal exam Possible mittleschmerz  Plan: Outpt pelvic US, f.u with GYN clinic Continue PNV  CRESENZO-DISHMAN,Myracle Febres

## 2013-12-17 NOTE — Discharge Instructions (Signed)
Mittelschmerz   Mittelschmerz is lower abdominal pain that happens between menstrual periods. Mittelschmerz is a German word that means "middle pain." It may occur right before, during, or after ovulation. It is usually felt on either the right or left side, depending on which ovary is passing the egg.   CAUSES   Pain may be felt when:  · There is irritation (inflammation) inside the abdomen. This is caused by the small amount of blood or fluid that may come from releasing the egg.  · The covering of the ovary stretches.  · Ovarian cysts develop.  · You have endometriosis. This is when the uterine lining tissue grows outside of the uterus.  · You have endometriomas. These are cysts that are formed by endometrial tissue.  SYMPTOMS   Pain may be:  · One-sided pain unless both ovaries are ovulating at the same time. If both ovaries are ovulating, there may be pain on both sides. This pain is often repeated every month. At times, there may be a month or two with no pain.  · Dull, cramping, or sharp.  · Short-lived or last up to 24 to 48 hours.  · Felt with bowel movements, diarrhea, or intercourse.  · Accompanied by a slight amount of vaginal bleeding.  DIAGNOSIS   · Your caregiver will take a history and do a physical exam.  · Blood tests and abdominal ultrasounds may be performed if the problem continues, becomes worse, or does not respond to the usual treatment.  · A thin, lighted tube may be put into your abdomen (laparoscopy) to check for problems if the pain gets worse or does not go away.  TREATMENT   Usually, no treatment is needed. If treatment is needed, it may include:  · Taking over-the-counter pain relievers.  · Taking birth control pills (oral contraceptives). This may be used to stop ovulation.  · Medical or surgical treatment if you have endometriomas.  Together, you and your caregiver can decide which course of treatment is best for you.  HOME CARE INSTRUCTIONS   · Only take over-the-counter or  prescription medicines for pain, discomfort, or fever as directed by your caregiver. Do not use aspirin. Aspirin may increase bleeding.  · Write down when the pain comes in relation to your menstrual period. Write down how bad it is, if you have a fever with the pain, and how long it lasts.  SEEK MEDICAL CARE IF:   · Your pain increases and is not controlled with medicine.  · Your pain is on both sides of your abdomen.  · You develop vaginal bleeding (more than just spotting) with the pain.  · You have a fever.  · You develop nausea or vomiting.  · You feel lightheaded or faint.  MAKE SURE YOU:   · Understand these instructions.  · Will watch your condition.  · Will get help right away if you are not doing well or get worse.  Document Released: 03/19/2002 Document Revised: 06/21/2011 Document Reviewed: 06/26/2010  ExitCare® Patient Information ©2015 [employer]. This information is not intended to replace advice given to you by your health care provider. Make sure you discuss any questions you have with your health care provider.

## 2013-12-17 NOTE — MAU Note (Signed)
Patient states she had a miscarriage in [place] on 7-31. States she has had right lower abdominal pain for a couple of weeks. Denies bleeding or discharge. Has nausea, no vomiting. Has had a negative home test.

## 2013-12-18 NOTE — MAU Provider Note (Signed)
Attestation of Attending Supervision of Advanced Practitioner (PA/CNM/NP): Evaluation and management procedures were performed by the Advanced Practitioner under my supervision and collaboration.  I have reviewed the Advanced Practitioner's note and chart, and I agree with the management and plan.  Donnamae Jude, [place] [facility] [place] Attending 12/18/2013 6:54 AM

## 2013-12-27 ENCOUNTER — Ambulatory Visit (HOSPITAL_COMMUNITY)
Admission: RE | Admit: 2013-12-27 | Discharge: 2013-12-27 | Disposition: A | Discharge status: Home / Self Care | Payer: Medicaid Other | Source: Ambulatory Visit | Attending: Advanced Practice Midwife | Admitting: Advanced Practice Midwife

## 2013-12-27 ENCOUNTER — Inpatient Hospital Stay (HOSPITAL_COMMUNITY)
Admission: AD | Admit: 2013-12-27 | Discharge: 2013-12-27 | Disposition: A | Discharge status: Home / Self Care | Payer: Medicaid Other | Source: Ambulatory Visit | Attending: Obstetrics & Gynecology | Admitting: Obstetrics & Gynecology

## 2013-12-27 DIAGNOSIS — R1032 Left lower quadrant pain: Secondary | ICD-10-CM | POA: Insufficient documentation

## 2013-12-27 DIAGNOSIS — N838 Other noninflammatory disorders of ovary, fallopian tube and broad ligament: Secondary | ICD-10-CM | POA: Diagnosis not present

## 2013-12-27 DIAGNOSIS — O039 Complete or unspecified spontaneous abortion without complication: Secondary | ICD-10-CM

## 2013-12-27 NOTE — Discharge Instructions (Signed)
Aborto espontáneo  °(Miscarriage) ° El aborto espontáneo es la pérdida de un bebé que no ha nacido.(feto) antes de la semana 20 del embarazo. La causa generalmente es desconocida.  °CUIDADOS EN EL HOGAR  °· Debe permanecer en cama (reposo en cama) o podrá hacer actividades livianas. Regrese a sus actividades según las indicaciones del médico. °· Pida ayuda con las tareas domésticas. °· Anote cuántos apósitos usa por día. Describa el grado en que están empapados. °· No use tampones. No se higienice la vagina (duchas vaginales) ni tenga relaciones sexuales (coito) hasta que el médico la autorice. °· Sólo debe tomar la medicación según las indicaciones del médico. °· No tome aspirina. °· Cumpla con los controles médicos según las indicaciones. °· Si usted o su pareja tienen problemas con el duelo, hable con su médico. También puede intentar con psicoterapia. Permítase el tiempo suficiente de duelo antes de quedar embarazada nuevamente. °SOLICITE AYUDA DE INMEDIATO SI:  °· Siente cólicos intensos o dolor en el estómago, en la espalda o en el vientre (abdomen). °· Tiene fiebre. °· Elimina grumos de sangre (coágulos) por la vagina, que tienen el tamaño de una nuez o más. Guarde los coágulos para que el médico los vea. °· Elimina gran cantidad de tejidos por la vagina. Guarde lo que ha eliminado para que su médico lo examine. °· Aumenta el sangrado. °· Observa una secreción espesa, con mal olor (pérdida) que proviene de la vagina. °· Se siente mareada, débil o se desvanece (se desmaya). °· Siente escalofríos. °ASEGÚRESE DE QUE:  °· Comprende estas instrucciones. °· Controlará su enfermedad. °· Solicitará ayuda de inmediato si no mejora o si empeora. °Document Released: 09/28/2011 °ExitCare® Patient Information ©2015 [employer]. This information is not intended to replace advice given to you by your health care provider. Make sure you discuss any questions you have with your health care provider. ° °

## 2013-12-27 NOTE — MAU Note (Signed)
Pt had SAB several weeks ago. Missed f/u appointment (out of town) Scheduled for f/u u/s today. Needs to be told results.

## 2013-12-27 NOTE — MAU Note (Signed)
Results given to pt by J.Wenzle,PA.

## 2013-12-27 NOTE — MAU Provider Note (Signed)
[name] is a 23 y.o. [id number] who presents to MAU today after outpatient [place] requesting [place] results. Patient was seen in MAU on 12/20/13 and scheduled for an outpatient [place] to confirm completed AB. Patient had [place] today. Patient denies pain today. She states she had a normal period starting on 12/21/13.   GENERAL: Well-developed, well-nourished female in no acute distress.  HEENT: Normocephalic, atraumatic.   LUNGS: Effort normal HEART: Regular rate  SKIN: Warm, dry and without erythema PSYCH: Normal mood and affect  US Transvaginal Non-ob  12/27/2013   CLINICAL DATA:  Spontaneous abortion in July, left lower quadrant pain currently  EXAM: TRANSVAGINAL ULTRASOUND OF PELVIS  TECHNIQUE: Transvaginal ultrasound examination of the pelvis was performed including evaluation of the uterus, ovaries, adnexal regions, and pelvic cul-de-sac.  COMPARISON:  Ultrasound pelvis of 10/23/2013  FINDINGS: Uterus  Measurements: 8.5 x 4.2 x 3.6 cm. No myometrial abnormality is seen.  Endometrium  Thickness: The endometrium is thin measuring 4.2 mm. No abnormality is seen.  Right ovary  Measurements: The right ovary is not visualized.  Left ovary  Measurements: The left ovary measures 3.1 x 2.1 x 2.1 cm with small follicles present.  Other findings:  No free fluid is seen.  IMPRESSION: 1. Small left ovarian follicles.  The right ovary is not seen. 2. The endometrium appears normal and no myometrial abnormality is seen.   Electronically Signed   By: Ivar Drape M.D.   On: 12/27/2013 16:37    A: Complete AB  P: Discharge home Patient advised to follow-up with [facility] or [facility] for GYN as needed Patient advised to use condoms for contraception if she does not desire pregnancy at this time Patient may return to MAU as needed or if her condition were to change or worsen  Luvenia Redden, PA-C 12/27/2013 4:52 PM

## 2013-12-27 NOTE — MAU Provider Note (Signed)
Attestation of Attending Supervision of Advanced Practitioner (CNM/NP): Evaluation and management procedures were performed by the Advanced Practitioner under my supervision and collaboration.  I have reviewed the Advanced Practitioner's note and chart, and I agree with the management and plan.  HARRAWAY-SMITH, Hagen Bohorquez 5:13 PM

## 2014-02-11 ENCOUNTER — Encounter (HOSPITAL_COMMUNITY): Payer: Self-pay

## 2014-03-08 ENCOUNTER — Inpatient Hospital Stay (HOSPITAL_COMMUNITY)
Admission: AD | Admit: 2014-03-08 | Discharge: 2014-03-08 | Disposition: A | Discharge status: Home / Self Care | Payer: Medicaid Other | Source: Ambulatory Visit | Attending: Obstetrics & Gynecology | Admitting: Obstetrics & Gynecology

## 2014-03-08 ENCOUNTER — Inpatient Hospital Stay (HOSPITAL_COMMUNITY): Payer: Medicaid Other

## 2014-03-08 ENCOUNTER — Encounter (HOSPITAL_COMMUNITY): Payer: Self-pay | Admitting: *Deleted

## 2014-03-08 DIAGNOSIS — O200 Threatened abortion: Secondary | ICD-10-CM | POA: Diagnosis not present

## 2014-03-08 DIAGNOSIS — O209 Hemorrhage in early pregnancy, unspecified: Secondary | ICD-10-CM

## 2014-03-08 DIAGNOSIS — O26899 Other specified pregnancy related conditions, unspecified trimester: Secondary | ICD-10-CM

## 2014-03-08 DIAGNOSIS — O26891 Other specified pregnancy related conditions, first trimester: Secondary | ICD-10-CM | POA: Insufficient documentation

## 2014-03-08 DIAGNOSIS — Z3A01 Less than 8 weeks gestation of pregnancy: Secondary | ICD-10-CM | POA: Diagnosis not present

## 2014-03-08 DIAGNOSIS — R109 Unspecified abdominal pain: Secondary | ICD-10-CM | POA: Diagnosis not present

## 2014-03-08 LAB — URINALYSIS, ROUTINE W REFLEX MICROSCOPIC
Bilirubin Urine: NEGATIVE
Glucose, UA: NEGATIVE mg/dL
KETONES UR: NEGATIVE mg/dL
LEUKOCYTES UA: NEGATIVE
NITRITE: NEGATIVE
PROTEIN: NEGATIVE mg/dL
Specific Gravity, Urine: 1.02 (ref 1.005–1.030)
UROBILINOGEN UA: 0.2 mg/dL (ref 0.0–1.0)
pH: 7 (ref 5.0–8.0)

## 2014-03-08 LAB — CBC
HCT: 38.4 % (ref 36.0–46.0)
Hemoglobin: 13.4 g/dL (ref 12.0–15.0)
MCH: 31.4 pg (ref 26.0–34.0)
MCHC: 34.9 g/dL (ref 30.0–36.0)
MCV: 89.9 fL (ref 78.0–100.0)
Platelets: 204 10*3/uL (ref 150–400)
RBC: 4.27 MIL/uL (ref 3.87–5.11)
RDW: 12.6 % (ref 11.5–15.5)
WBC: 7.5 10*3/uL (ref 4.0–10.5)

## 2014-03-08 LAB — WET PREP, GENITAL
TRICH WET PREP: NONE SEEN
WBC WET PREP: NONE SEEN
Yeast Wet Prep HPF POC: NONE SEEN

## 2014-03-08 LAB — URINE MICROSCOPIC-ADD ON

## 2014-03-08 LAB — HCG, QUANTITATIVE, PREGNANCY: hCG, Beta Chain, Quant, S: 9234 m[IU]/mL — ABNORMAL HIGH (ref <5)

## 2014-03-08 LAB — POCT PREGNANCY, URINE: Preg Test, Ur: POSITIVE — AB

## 2014-03-08 MED ORDER — ACETAMINOPHEN 325 MG PO TABS
650.0000 mg | ORAL_TABLET | ORAL | Status: AC
  Administered 2014-03-08: 650 mg via ORAL
  Filled 2014-03-08: qty 2

## 2014-03-08 NOTE — Discharge Instructions (Signed)
Abdominal Pain During Pregnancy °Abdominal pain is common in pregnancy. Most of the time, it does not cause harm. There are many causes of abdominal pain. Some causes are more serious than others. Some of the causes of abdominal pain in pregnancy are easily diagnosed. Occasionally, the diagnosis takes time to understand. Other times, the cause is not determined. Abdominal pain can be a sign that something is very wrong with the pregnancy, or the pain may have nothing to do with the pregnancy at all. For this reason, always tell your health care provider if you have any abdominal discomfort. °HOME CARE INSTRUCTIONS  °Monitor your abdominal pain for any changes. The following actions may help to alleviate any discomfort you are experiencing: °· Do not have sexual intercourse or put anything in your vagina until your symptoms go away completely. °· Get plenty of rest until your pain improves. °· Drink clear fluids if you feel nauseous. Avoid solid food as long as you are uncomfortable or nauseous. °· Only take over-the-counter or prescription medicine as directed by your health care provider. °· Keep all follow-up appointments with your health care provider. °SEEK IMMEDIATE MEDICAL CARE IF: °· You are bleeding, leaking fluid, or passing tissue from the vagina. °· You have increasing pain or cramping. °· You have persistent vomiting. °· You have painful or bloody urination. °· You have a fever. °· You notice a decrease in your baby's movements. °· You have extreme weakness or feel faint. °· You have shortness of breath, with or without abdominal pain. °· You develop a severe headache with abdominal pain. °· You have abnormal vaginal discharge with abdominal pain. °· You have persistent diarrhea. °· You have abdominal pain that continues even after rest, or gets worse. °MAKE SURE YOU:  °· Understand these instructions. °· Will watch your condition. °· Will get help right away if you are not doing well or get  worse. °Document Released: 03/29/2005 Document Revised: 01/17/2013 Document Reviewed: 10/26/2012 °ExitCare® Patient Information ©2015 [employer]. This information is not intended to replace advice given to you by your health care provider. Make sure you discuss any questions you have with your health care provider. °Pelvic Rest °Pelvic rest is sometimes recommended for women when:  °· The placenta is partially or completely covering the opening of the cervix (placenta previa). °· There is bleeding between the uterine wall and the amniotic sac in the first trimester (subchorionic hemorrhage). °· The cervix begins to open without labor starting (incompetent cervix, cervical insufficiency). °· The labor is too early (preterm labor). °HOME CARE INSTRUCTIONS °· Do not have sexual intercourse, stimulation, or an orgasm. °· Do not use tampons, douche, or put anything in the vagina. °· Do not lift anything over 10 pounds (4.5 kg). °· Avoid strenuous activity or straining your pelvic muscles. °SEEK MEDICAL CARE IF:  °· You have any vaginal bleeding during pregnancy. Treat this as a potential emergency. °· You have cramping pain felt low in the stomach (stronger than menstrual cramps). °· You notice vaginal discharge (watery, mucus, or bloody). °· You have a low, dull backache. °· There are regular contractions or uterine tightening. °SEEK IMMEDIATE MEDICAL CARE IF: °You have vaginal bleeding and have placenta previa.  °Document Released: 07/24/2010 Document Revised: 06/21/2011 Document Reviewed: 07/24/2010 °ExitCare® Patient Information ©2015 [employer]. This information is not intended to replace advice given to you by your health care provider. Make sure you discuss any questions you have with your health care provider. ° °

## 2014-03-08 NOTE — MAU Note (Signed)
Had a miscarriage in July.  Had f/u was told everything was normal.  Has 2 normal cycles., period was on 11/11-14, had some spotting on 11/06.  Had +HPT on 18 and 21.  Today started having pain in lower abd and is seeing blood when she wipes.

## 2014-03-08 NOTE — MAU Provider Note (Signed)
History     CSN: [id number]  Arrival date and time: 03/08/14 1208   First Provider Initiated Contact with Patient 03/08/14 1307      No chief complaint on file.  HPI [name] 23 y.o. [id number] @[redacted]w[redacted]d  presents to MAU complaining of abdominal pain and vaginal bleeding that started this morning.  She first had a positive home pregnancy test on 02/27/14.  She awoke with pain 10/10 described as pressure that does not improve along.  It is located in RLQ and associated with scant pink discharge.  Moving around makes it worse.  Lying down helps as well as Tylenol 325mg .  No fever, chest pain, shortness of breath or dysuria.     OB History    Gravida Para Term Preterm AB TAB SAB Ectopic Multiple Living   5 2 2  1  1   2       Past Medical History  Diagnosis Date  . Medical history non-contributory     History reviewed. No pertinent past surgical history.  Family History  Problem Relation Age of Onset  . Diabetes Mother   . Heart disease Mother     History  Substance Use Topics  . Smoking status: Never Smoker   . Smokeless tobacco: Not on file  . Alcohol Use: No    Allergies: No Known Allergies  Prescriptions prior to admission  Medication Sig Dispense Refill Last Dose  . acetaminophen (TYLENOL) 325 MG tablet Take 325 mg by mouth every 6 (six) hours as needed for mild pain.   Past Week at Unknown time  . Prenatal Vit-Fe Fumarate-FA (PRENATAL MULTIVITAMIN) TABS tablet Take 1 tablet by mouth daily.    12/17/2013 at Unknown time    ROS Pertinent ROS in HPI.  Physical Exam   Blood pressure 140/84, pulse 86, height 4\' 11"  (1.499 m), weight 179 lb (81.194 kg), last menstrual period 01/23/2014, unknown if currently breastfeeding.  Physical Exam  Constitutional: She is oriented to person, place, and time. She appears well-developed and well-nourished.  HENT:  Head: Normocephalic and atraumatic.  Eyes: EOM are normal.  Neck: Normal range of motion.  Cardiovascular:  Normal rate and regular rhythm.   Respiratory: Effort normal and breath sounds normal. No respiratory distress.  GI: Soft. Bowel sounds are normal. She exhibits no distension. There is tenderness. There is no rebound and no guarding.  RLQ tenderness  Genitourinary:  Vagina with dark pink fluid present.  No CMT.  NO adnexal pain or mass identified.  Musculoskeletal: Normal range of motion.  Neurological: She is alert and oriented to person, place, and time.  Skin: Skin is warm and dry.  Psychiatric: She has a normal mood and affect.   Results for orders placed or performed during the hospital encounter of 03/08/14 (from the past 24 hour(s))  Urinalysis, Routine w reflex microscopic     Status: Abnormal   Collection Time: 03/08/14 12:35 PM  Result Value Ref Range   Color, Urine YELLOW YELLOW   APPearance CLEAR CLEAR   Specific Gravity, Urine 1.020 1.005 - 1.030   pH 7.0 5.0 - 8.0   Glucose, UA NEGATIVE NEGATIVE mg/dL   Hgb urine dipstick LARGE (A) NEGATIVE   Bilirubin Urine NEGATIVE NEGATIVE   Ketones, ur NEGATIVE NEGATIVE mg/dL   Protein, ur NEGATIVE NEGATIVE mg/dL   Urobilinogen, UA 0.2 0.0 - 1.0 mg/dL   Nitrite NEGATIVE NEGATIVE   Leukocytes, UA NEGATIVE NEGATIVE  Urine microscopic-add on     Status: Abnormal   Collection Time:  03/08/14 12:35 PM  Result Value Ref Range   Squamous Epithelial / LPF FEW (A) RARE   WBC, UA 0-2 <3 WBC/hpf  Pregnancy, urine POC     Status: Abnormal   Collection Time: 03/08/14 12:45 PM  Result Value Ref Range   Preg Test, Ur POSITIVE (A) NEGATIVE  CBC     Status: None   Collection Time: 03/08/14  1:06 PM  Result Value Ref Range   WBC 7.5 4.0 - 10.5 K/uL   RBC 4.27 3.87 - 5.11 MIL/uL   Hemoglobin 13.4 12.0 - 15.0 g/dL   HCT 38.4 36.0 - 46.0 %   MCV 89.9 78.0 - 100.0 fL   MCH 31.4 26.0 - 34.0 pg   MCHC 34.9 30.0 - 36.0 g/dL   RDW 12.6 11.5 - 15.5 %   Platelets 204 150 - 400 K/uL  hCG, quantitative, pregnancy     Status: Abnormal   Collection  Time: 03/08/14  1:06 PM  Result Value Ref Range   hCG, Beta Chain, Quant, S 9234 (H) <5 mIU/mL  Wet prep, genital     Status: Abnormal   Collection Time: 03/08/14  2:41 PM  Result Value Ref Range   Yeast Wet Prep HPF POC NONE SEEN NONE SEEN   Trich, Wet Prep NONE SEEN NONE SEEN   Clue Cells Wet Prep HPF POC FEW (A) NONE SEEN   WBC, Wet Prep HPF POC NONE SEEN NONE SEEN   US Ob Comp Less 14 Wks  03/08/2014   CLINICAL DATA:  Pregnancy, vaginal bleeding which began today, RIGHT pelvic pain, 6 weeks 2 days EGA by LMP of 01/23/2014  EXAM: OBSTETRIC <14 WK [place] AND TRANSVAGINAL OB US  TECHNIQUE: Both transabdominal and transvaginal ultrasound examinations were performed for complete evaluation of the gestation as well as the maternal uterus, adnexal regions, and pelvic cul-de-sac. Transvaginal technique was performed to assess early pregnancy.  COMPARISON:  12/27/2013 pelvic ultrasound, OB ultrasound 10/23/2013  FINDINGS: Intrauterine gestational sac: Visualized/normal in shape.  Yolk sac:  Questionably visualized  Embryo:  Not identified  Cardiac Activity: N/A  Heart Rate:  N/A bpm  MSD:  11.8  mm   6 w   2  d           [place] EDC: 11/01/2014  Maternal uterus/adnexae:  No subchorionic hemorrhage.  RIGHT ovary normal size and morphology, 3.3 x 3.8 x 2.4 cm.  LEFT ovary measures 3.9 x 1.9 x 3.3 cm and contains a probable small corpus luteum.  Trace free pelvic fluid.  No adnexal masses.  IMPRESSION: Probable early intrauterine gestational sac, but no definitive yolk sac, fetal pole, or cardiac activity yet visualized. Recommend follow-up quantitative B-HCG levels and follow-up US in 14 days to confirm and assess viability. This recommendation follows SRU consensus guidelines: Diagnostic Criteria for Nonviable Pregnancy Early in the First Trimester. [employer] Med 2013; 623:7628-31.   Electronically Signed   By: Lavonia Dana M.D.   On: 03/08/2014 14:37   US Ob Transvaginal  03/08/2014   CLINICAL DATA:  Pregnancy,  vaginal bleeding which began today, RIGHT pelvic pain, 6 weeks 2 days EGA by LMP of 01/23/2014  EXAM: OBSTETRIC <14 WK [place] AND TRANSVAGINAL OB US  TECHNIQUE: Both transabdominal and transvaginal ultrasound examinations were performed for complete evaluation of the gestation as well as the maternal uterus, adnexal regions, and pelvic cul-de-sac. Transvaginal technique was performed to assess early pregnancy.  COMPARISON:  12/27/2013 pelvic ultrasound, OB ultrasound 10/23/2013  FINDINGS: Intrauterine gestational  sac: Visualized/normal in shape.  Yolk sac:  Questionably visualized  Embryo:  Not identified  Cardiac Activity: N/A  Heart Rate:  N/A bpm  MSD:  11.8  mm   6 w   2  d           [place] EDC: 11/01/2014  Maternal uterus/adnexae:  No subchorionic hemorrhage.  RIGHT ovary normal size and morphology, 3.3 x 3.8 x 2.4 cm.  LEFT ovary measures 3.9 x 1.9 x 3.3 cm and contains a probable small corpus luteum.  Trace free pelvic fluid.  No adnexal masses.  IMPRESSION: Probable early intrauterine gestational sac, but no definitive yolk sac, fetal pole, or cardiac activity yet visualized. Recommend follow-up quantitative B-HCG levels and follow-up US in 14 days to confirm and assess viability. This recommendation follows SRU consensus guidelines: Diagnostic Criteria for Nonviable Pregnancy Early in the First Trimester. [employer] Med 2013; 423:5361-44.   Electronically Signed   By: Lavonia Dana M.D.   On: 03/08/2014 14:37    MAU Course  Procedures  MDM Early bleeding in pregnancy.  [place] ordered. Tylenol ordered for discomfort.  Pt indicates pain relief.  No further complain  Assessment and Plan  A: Vaginal bleeding in pregnancy, Abdominal pain in pregnancy, Threatened ab, elevated blood pressure  P: Discharge to home Return to MAU in 2 days for repeat HCG levels Will need U/S in 14 days if pregnancy continues PNV Ectopic precautions given Patient may return to MAU as needed or if her condition were to change or  worsen    Paticia Stack 03/08/2014, 1:08 PM

## 2014-03-10 ENCOUNTER — Inpatient Hospital Stay (HOSPITAL_COMMUNITY)
Admission: AD | Admit: 2014-03-10 | Discharge: 2014-03-10 | Disposition: A | Discharge status: Home / Self Care | Payer: Medicaid Other | Source: Ambulatory Visit | Attending: Obstetrics & Gynecology | Admitting: Obstetrics & Gynecology

## 2014-03-10 DIAGNOSIS — Z3A01 Less than 8 weeks gestation of pregnancy: Secondary | ICD-10-CM | POA: Diagnosis not present

## 2014-03-10 DIAGNOSIS — O209 Hemorrhage in early pregnancy, unspecified: Secondary | ICD-10-CM | POA: Insufficient documentation

## 2014-03-10 LAB — HCG, QUANTITATIVE, PREGNANCY: HCG, BETA CHAIN, QUANT, S: 13994 m[IU]/mL — AB (ref <5)

## 2014-03-10 NOTE — MAU Note (Signed)
Pt presents to MAU for repeat labs. Denies any vaginal bleeding or LOF

## 2014-03-10 NOTE — MAU Provider Note (Signed)
  History     CSN: [id number]  Arrival date and time: 03/10/14 1607   Seen by provider at 4    Chief Complaint  Patient presents with  . Labs Only   HPI [name] 23 y.o. [redacted]w[redacted]d  Was seen in MAU on 03-08-14 for bleeding in pregnancy.  Likely a IUGS but no yolk sac seen.  Comes today for repeat quant.  Denies any bleeding or abdominal pain today.  Has had a miscarriage previously and is wondering if this pregnancy will be a miscarriage.  OB History    Gravida Para Term Preterm AB TAB SAB Ectopic Multiple Living   5 2 2  1  1   2       Past Medical History  Diagnosis Date  . Medical history non-contributory     No past surgical history on file.  Family History  Problem Relation Age of Onset  . Diabetes Mother   . Heart disease Mother     History  Substance Use Topics  . Smoking status: Never Smoker   . Smokeless tobacco: Not on file  . Alcohol Use: No    Allergies: No Known Allergies  Prescriptions prior to admission  Medication Sig Dispense Refill Last Dose  . acetaminophen (TYLENOL) 325 MG tablet Take 325 mg by mouth every 6 (six) hours as needed for mild pain.   Past Week at Unknown time  . Prenatal Vit-Fe Fumarate-FA (PRENATAL MULTIVITAMIN) TABS tablet Take 1 tablet by mouth daily.    03/07/2014 at Unknown time    Review of Systems  Constitutional: Negative for fever.  Gastrointestinal: Negative for nausea, vomiting and abdominal pain.  Genitourinary:       No vaginal discharge. No vaginal bleeding. No dysuria.   Physical Exam   Blood pressure 128/71, pulse 73, temperature 98.4 F (36.9 C), resp. rate 16, last menstrual period 01/23/2014, unknown if currently breastfeeding.  Physical Exam  Nursing note and vitals reviewed. Constitutional: She is oriented to person, place, and time. She appears well-developed and well-nourished.  HENT:  Head: Normocephalic.  Eyes: EOM are normal.  Neck: Neck supple.  Respiratory: Effort normal.   GI: There is no guarding.  Musculoskeletal: Normal range of motion.  Neurological: She is alert and oriented to person, place, and time.  Skin: Skin is warm and dry.  Psychiatric: She has a normal mood and affect.    MAU Course  Procedures  MDM Results for Amanda, Holloway (MRN [id number]) as of 03/10/2014 10:29  Ref. Range 03/08/2014 12:45 03/08/2014 13:06 03/08/2014 14:29 03/08/2014 14:41 03/10/2014 09:23  hCG, Beta Chain, Quant, S Latest Range: <5 mIU/mL  9234 (H)   13994 (H)   1035  Consult with Dr. Elonda Husky and discussed plan of care   Assessment and Plan  Rising Quants in early pregnancy, but quant did not double  Plan Will have [place] call to schedule a repeat US on 03-15-14.  At that time, the [place] will likely determine if this is a healthy pregnancy or if this will be a pregnancy that ends in miscarriage. Return sooner if having severe lower abdominal pain or heavy bleeding. Reviewed information with client.  Answered her questions.  Discharged in no distress.   [name] 03/10/2014, 10:30 AM

## 2014-03-11 LAB — GC/CHLAMYDIA PROBE AMP

## 2014-03-15 ENCOUNTER — Encounter (HOSPITAL_COMMUNITY): Payer: Self-pay | Admitting: Advanced Practice Midwife

## 2014-03-15 ENCOUNTER — Ambulatory Visit (HOSPITAL_COMMUNITY)
Admission: RE | Admit: 2014-03-15 | Discharge: 2014-03-15 | Disposition: A | Discharge status: Home / Self Care | Payer: Medicaid Other | Source: Ambulatory Visit | Attending: Nurse Practitioner | Admitting: Nurse Practitioner

## 2014-03-15 ENCOUNTER — Other Ambulatory Visit: Payer: Self-pay | Admitting: Advanced Practice Midwife

## 2014-03-15 DIAGNOSIS — Z3A01 Less than 8 weeks gestation of pregnancy: Secondary | ICD-10-CM | POA: Diagnosis not present

## 2014-03-15 DIAGNOSIS — O209 Hemorrhage in early pregnancy, unspecified: Secondary | ICD-10-CM | POA: Insufficient documentation

## 2014-03-15 DIAGNOSIS — N949 Unspecified condition associated with female genital organs and menstrual cycle: Secondary | ICD-10-CM | POA: Insufficient documentation

## 2014-03-15 NOTE — Progress Notes (Signed)
S: 23 y.o. [id number] @[redacted]w[redacted]d  presents for scheduled outpatient ultrasound.  She denies pain or bleeding today.  O: Physical Examination: General appearance - alert, well appearing, and in no distress, oriented to person, place, and time and acyanotic, in no respiratory distress  US Ob Transvaginal  03/15/2014   CLINICAL DATA:  Bleeding in early pregnancy.  Pelvic pain.  EXAM: TRANSVAGINAL OB ULTRASOUND  TECHNIQUE: Transvaginal ultrasound was performed for complete evaluation of the gestation as well as the maternal uterus, adnexal regions, and pelvic cul-de-sac.  COMPARISON:  03/08/2014  FINDINGS: Intrauterine gestational sac: Visualized/normal in shape.  Yolk sac:  Visualized  Embryo:  Visualized  Cardiac Activity: Visualized  Heart Rate: 124 bpm  CRL:   7  mm   6 w 5 d                  [place] EDC: 11/03/2014  Maternal uterus/adnexae: A tiny subchorionic hemorrhage is noted. The left ovary is normal in appearance. Right ovary is not directly visualized however no adnexal mass or free fluid identified.  IMPRESSION: Single living IUP measuring 6 weeks 5 days with [place] EDC of 11/03/2014. Appropriate progression since prior exam.  Tiny subchorionic hemorrhage noted.   Electronically Signed   By: Earle Gell M.D.   On: 03/15/2014 15:09   A: IUP on ultrasound  P: D/c home Presented normal u/s findings with pt in MAU F/U with early prenatal care Return to MAU as needed for emergencies  Fatima Blank Certified Nurse-Midwife

## 2014-03-30 ENCOUNTER — Encounter (HOSPITAL_COMMUNITY): Payer: Self-pay | Admitting: *Deleted

## 2014-03-30 ENCOUNTER — Inpatient Hospital Stay (HOSPITAL_COMMUNITY)
Admission: AD | Admit: 2014-03-30 | Discharge: 2014-03-30 | Disposition: A | Discharge status: Home / Self Care | Payer: Medicaid Other | Source: Ambulatory Visit | Attending: Obstetrics & Gynecology | Admitting: Obstetrics & Gynecology

## 2014-03-30 DIAGNOSIS — N949 Unspecified condition associated with female genital organs and menstrual cycle: Secondary | ICD-10-CM | POA: Diagnosis not present

## 2014-03-30 DIAGNOSIS — O9989 Other specified diseases and conditions complicating pregnancy, childbirth and the puerperium: Secondary | ICD-10-CM | POA: Diagnosis not present

## 2014-03-30 DIAGNOSIS — Z3A08 8 weeks gestation of pregnancy: Secondary | ICD-10-CM | POA: Diagnosis not present

## 2014-03-30 DIAGNOSIS — R109 Unspecified abdominal pain: Secondary | ICD-10-CM | POA: Diagnosis present

## 2014-03-30 DIAGNOSIS — O26899 Other specified pregnancy related conditions, unspecified trimester: Secondary | ICD-10-CM

## 2014-03-30 LAB — CBC
HCT: 35.7 % — ABNORMAL LOW (ref 36.0–46.0)
Hemoglobin: 12.6 g/dL (ref 12.0–15.0)
MCH: 32 pg (ref 26.0–34.0)
MCHC: 35.3 g/dL (ref 30.0–36.0)
MCV: 90.6 fL (ref 78.0–100.0)
Platelets: 190 10*3/uL (ref 150–400)
RBC: 3.94 MIL/uL (ref 3.87–5.11)
RDW: 12.7 % (ref 11.5–15.5)
WBC: 9.2 10*3/uL (ref 4.0–10.5)

## 2014-03-30 LAB — URINALYSIS, ROUTINE W REFLEX MICROSCOPIC
BILIRUBIN URINE: NEGATIVE
Glucose, UA: NEGATIVE mg/dL
HGB URINE DIPSTICK: NEGATIVE
Ketones, ur: NEGATIVE mg/dL
NITRITE: NEGATIVE
PROTEIN: NEGATIVE mg/dL
SPECIFIC GRAVITY, URINE: 1.02 (ref 1.005–1.030)
Urobilinogen, UA: 2 mg/dL — ABNORMAL HIGH (ref 0.0–1.0)
pH: 6 (ref 5.0–8.0)

## 2014-03-30 LAB — URINE MICROSCOPIC-ADD ON

## 2014-03-30 NOTE — MAU Note (Signed)
Pt states here for pain at umbilicus. When turning on her side at night, feels pulling pain. Yesterday took tylenol and helped somewhat. Happened again today and came here for further eval. No bleeding or abnormal vag d/c.

## 2014-03-30 NOTE — Discharge Instructions (Signed)
Round Ligament Pain During Pregnancy Round ligament pain is a sharp pain or jabbing feeling often felt in the lower belly or groin area on one or both sides. It is one of the most common complaints during pregnancy and is considered a normal part of pregnancy. It is most often felt during the second trimester.  Here is what you need to know about round ligament pain, including some tips to help you feel better.  Causes of Round Ligament Pain  Several thick ligaments surround and support your womb (uterus) as it grows during pregnancy. One of them is called the round ligament.  The round ligament connects the front part of the womb to your groin, the area where your legs attach to your pelvis. The round ligament normally tightens and relaxes slowly.  As your baby and womb grow, the round ligament stretches. That makes it more likely to become strained.  Sudden movements can cause the ligament to tighten quickly, like a rubber band snapping. This causes a sudden and quick jabbing feeling.  Symptoms of Round Ligament Pain  Round ligament pain can be concerning and uncomfortable. But it is considered normal as your body changes during pregnancy.  The symptoms of round ligament pain include a sharp, sudden spasm in the belly. It usually affects the right side, but it may happen on both sides. The pain only lasts a few seconds.  Exercise may cause the pain, as will rapid movements such as:  sneezing coughing laughing rolling over in bed standing up too quickly  Treatment of Round Ligament Pain  Here are some tips that may help reduce your discomfort:  Pain relief. Take over-the-counter acetaminophen for pain, if necessary. Ask your doctor if this is OK.  Exercise. Get plenty of exercise to keep your stomach (core) muscles strong. Doing stretching exercises or prenatal yoga can be helpful. Ask your doctor which exercises are safe for you and your baby.  A helpful exercise involves  putting your hands and knees on the floor, lowering your head, and pushing your backside into the air.  Avoid sudden movements. Change positions slowly (such as standing up or sitting down) to avoid sudden movements that may cause stretching and pain.  Flex your hips. Bend and flex your hips before you cough, sneeze, or laugh to avoid pulling on the ligaments.  Apply warmth. A heating pad or warm bath may be helpful. Ask your doctor if this is OK. Extreme heat can be dangerous to the baby.  You should try to modify your daily activity level and avoid positions that may worsen the condition.  When to Call the Doctor/Midwife  Always tell your doctor or midwife about any type of pain you have during pregnancy. Round ligament pain is quick and doesn't last long.  Call your health care provider immediately if you have:  severe pain fever chills pain on urination difficulty walking  Belly pain during pregnancy can be due to many different causes. It is important for your doctor to rule out more serious conditions, including pregnancy complications such as placenta abruption or non-pregnancy illnesses such as:  inguinal hernia appendicitis stomach, liver, and kidney problems Preterm labor pains may sometimes be mistaken for round ligament pain.   Abdominal Pain During Pregnancy Abdominal pain is common in pregnancy. Most of the time, it does not cause harm. There are many causes of abdominal pain. Some causes are more serious than others. Some of the causes of abdominal pain in pregnancy are easily diagnosed. Occasionally,  the diagnosis takes time to understand. Other times, the cause is not determined. Abdominal pain can be a sign that something is very wrong with the pregnancy, or the pain may have nothing to do with the pregnancy at all. For this reason, always tell your health care provider if you have any abdominal discomfort. HOME CARE INSTRUCTIONS  Monitor your abdominal pain for any  changes. The following actions may help to alleviate any discomfort you are experiencing:  Do not have sexual intercourse or put anything in your vagina until your symptoms go away completely.  Get plenty of rest until your pain improves.  Drink clear fluids if you feel nauseous. Avoid solid food as long as you are uncomfortable or nauseous.  Only take over-the-counter or prescription medicine as directed by your health care provider.  Keep all follow-up appointments with your health care provider. SEEK IMMEDIATE MEDICAL CARE IF:  You are bleeding, leaking fluid, or passing tissue from the vagina.  You have increasing pain or cramping.  You have persistent vomiting.  You have painful or bloody urination.  You have a fever.  You notice a decrease in your baby's movements.  You have extreme weakness or feel faint.  You have shortness of breath, with or without abdominal pain.  You develop a severe headache with abdominal pain.  You have abnormal vaginal discharge with abdominal pain.  You have persistent diarrhea.  You have abdominal pain that continues even after rest, or gets worse. MAKE SURE YOU:   Understand these instructions.  Will watch your condition.  Will get help right away if you are not doing well or get worse. Document Released: 03/29/2005 Document Revised: 01/17/2013 Document Reviewed: 10/26/2012 [facility] Patient Information 2015 [place], [place]. This information is not intended to replace advice given to you by your health care provider. Make sure you discuss any questions you have with your health care provider.

## 2014-03-30 NOTE — MAU Provider Note (Signed)
Chief Complaint: Abdominal Pain   First Provider Initiated Contact with Patient 03/30/14 1635     SUBJECTIVE HPI: [name] is a 23 y.o. [id number] at [redacted]w[redacted]d by LMP who presents to maternity admissions reporting pain at her umbilicus when she changes positions or rolls over in bed.  The pain feels like a pulling sensation from her umbilicus to her right lower abdomen.  She had viable IUP on U/S 03/08/14 and has appointment to begin care at 10 weeks at [facility].  She denies abdominal cramping, vaginal bleeding, vaginal itching/burning, urinary symptoms, h/a, dizziness, n/v, or fever/chills.     Past Medical History  Diagnosis Date  . Medical history non-contributory    History reviewed. No pertinent past surgical history. History   Social History  . Marital Status: Single    Spouse Name: N/A    Number of Children: N/A  . Years of Education: N/A   Occupational History  . Not on file.   Social History Main Topics  . Smoking status: Never Smoker   . Smokeless tobacco: Not on file  . Alcohol Use: No  . Drug Use: No  . Sexual Activity: Yes   Other Topics Concern  . Not on file   Social History Narrative   No current facility-administered medications on file prior to encounter.   Current Outpatient Prescriptions on File Prior to Encounter  Medication Sig Dispense Refill  . Prenatal Vit-Fe Fumarate-FA (PRENATAL MULTIVITAMIN) TABS tablet Take 1 tablet by mouth daily.      No Known Allergies  ROS: Pertinent items in HPI  OBJECTIVE Blood pressure 132/65, pulse 73, temperature 98.1 F (36.7 C), temperature source Oral, resp. rate 16, height 5' (1.524 m), weight 80.4 kg (177 lb 4 oz), last menstrual period 01/23/2014, not currently breastfeeding. GENERAL: Well-developed, well-nourished female in no acute distress.  HEENT: Normocephalic HEART: normal rate RESP: normal effort ABDOMEN: Soft, mild tenderness in right lower quadrant, no rebound tenderness, no  guarding EXTREMITIES: Nontender, no edema NEURO: Alert and oriented   LAB RESULTS Results for orders placed or performed during the hospital encounter of 03/30/14 (from the past 24 hour(s))  Urinalysis, Routine w reflex microscopic     Status: Abnormal   Collection Time: 03/30/14  4:02 PM  Result Value Ref Range   Color, Urine YELLOW YELLOW   APPearance CLEAR CLEAR   Specific Gravity, Urine 1.020 1.005 - 1.030   pH 6.0 5.0 - 8.0   Glucose, UA NEGATIVE NEGATIVE mg/dL   Hgb urine dipstick NEGATIVE NEGATIVE   Bilirubin Urine NEGATIVE NEGATIVE   Ketones, ur NEGATIVE NEGATIVE mg/dL   Protein, ur NEGATIVE NEGATIVE mg/dL   Urobilinogen, UA 2.0 (H) 0.0 - 1.0 mg/dL   Nitrite NEGATIVE NEGATIVE   Leukocytes, UA TRACE (A) NEGATIVE  Urine microscopic-add on     Status: Abnormal   Collection Time: 03/30/14  4:02 PM  Result Value Ref Range   Squamous Epithelial / LPF FEW (A) RARE   WBC, UA 0-2 <3 WBC/hpf   Urine-Other MUCOUS PRESENT   CBC     Status: Abnormal   Collection Time: 03/30/14  5:30 PM  Result Value Ref Range   WBC 9.2 4.0 - 10.5 K/uL   RBC 3.94 3.87 - 5.11 MIL/uL   Hemoglobin 12.6 12.0 - 15.0 g/dL   HCT 35.7 (L) 36.0 - 46.0 %   MCV 90.6 78.0 - 100.0 fL   MCH 32.0 26.0 - 34.0 pg   MCHC 35.3 30.0 - 36.0 g/dL   RDW 12.7  11.5 - 15.5 %   Platelets 190 150 - 400 K/uL    ASSESSMENT 1. Abdominal pain affecting pregnancy   2. Round ligament pain     PLAN Discharge home Rest/ice/heat/warm bath/Tylenol for pain  Follow-up Information    Follow up with [facility].   Why:  As scheduled   Contact information:   [address] [place] [address] [phone]      Follow up with [facility].   Why:  As needed for emergencies   Contact information:   [address] [id number] [place] Certified  Nurse-Midwife 03/30/2014  6:26 PM

## 2014-04-12 NOTE — L&D Delivery Note (Signed)
Delivery Note At 4:31 PM a viable female, "[name]", was delivered via precipitous Vaginal, Spontaneous Delivery (Presentation: ROA).  APGAR: 9, 10; weight  .   Placenta status: Intact, Spontaneous.  Cord: 3 vessels with the following complications: None.  Cord pH: NA  Anesthesia: Epidural  Episiotomy: None Lacerations: Left peri-urethral Suture Repair: 3.0 vicryl Est. Blood Loss (mL):  100  Mom to postpartum.  Baby to Couplet care / Skin to Skin. Family declines circumcision. Patient undecided regarding circumcision.  Donnel Saxon 10/11/2014, 5:29 PM

## 2014-04-18 ENCOUNTER — Telehealth: Payer: Self-pay | Admitting: *Deleted

## 2014-04-18 NOTE — Telephone Encounter (Signed)
Patient contacted the office regarding an appointment. Attempted to contact the patient and left message for patient to contact the office.

## 2014-04-22 LAB — OB RESULTS CONSOLE HEPATITIS B SURFACE ANTIGEN: Hepatitis B Surface Ag: NEGATIVE

## 2014-04-22 LAB — OB RESULTS CONSOLE ANTIBODY SCREEN: Antibody Screen: NEGATIVE

## 2014-04-22 LAB — OB RESULTS CONSOLE RPR: RPR: NONREACTIVE

## 2014-04-22 LAB — OB RESULTS CONSOLE HIV ANTIBODY (ROUTINE TESTING): HIV: NONREACTIVE

## 2014-04-22 LAB — OB RESULTS CONSOLE RUBELLA ANTIBODY, IGM: Rubella: NON-IMMUNE/NOT IMMUNE

## 2014-04-29 NOTE — Telephone Encounter (Signed)
Patient states she has made an appointment with another office.

## 2014-05-22 ENCOUNTER — Inpatient Hospital Stay (HOSPITAL_COMMUNITY)
Admission: AD | Admit: 2014-05-22 | Discharge: 2014-05-22 | Disposition: A | Discharge status: Home / Self Care | Payer: Medicaid Other | Source: Ambulatory Visit | Attending: Obstetrics and Gynecology | Admitting: Obstetrics and Gynecology

## 2014-05-22 ENCOUNTER — Encounter (HOSPITAL_COMMUNITY): Payer: Self-pay | Admitting: *Deleted

## 2014-05-22 DIAGNOSIS — O26859 Spotting complicating pregnancy, unspecified trimester: Secondary | ICD-10-CM | POA: Diagnosis not present

## 2014-05-22 DIAGNOSIS — O2342 Unspecified infection of urinary tract in pregnancy, second trimester: Secondary | ICD-10-CM | POA: Insufficient documentation

## 2014-05-22 DIAGNOSIS — R1032 Left lower quadrant pain: Secondary | ICD-10-CM | POA: Insufficient documentation

## 2014-05-22 DIAGNOSIS — O9989 Other specified diseases and conditions complicating pregnancy, childbirth and the puerperium: Secondary | ICD-10-CM | POA: Diagnosis not present

## 2014-05-22 DIAGNOSIS — O26852 Spotting complicating pregnancy, second trimester: Secondary | ICD-10-CM | POA: Insufficient documentation

## 2014-05-22 DIAGNOSIS — R21 Rash and other nonspecific skin eruption: Secondary | ICD-10-CM | POA: Insufficient documentation

## 2014-05-22 DIAGNOSIS — Z3A16 16 weeks gestation of pregnancy: Secondary | ICD-10-CM | POA: Diagnosis not present

## 2014-05-22 DIAGNOSIS — B952 Enterococcus as the cause of diseases classified elsewhere: Secondary | ICD-10-CM | POA: Insufficient documentation

## 2014-05-22 DIAGNOSIS — R109 Unspecified abdominal pain: Secondary | ICD-10-CM

## 2014-05-22 DIAGNOSIS — R102 Pelvic and perineal pain: Secondary | ICD-10-CM

## 2014-05-22 DIAGNOSIS — Z8759 Personal history of other complications of pregnancy, childbirth and the puerperium: Secondary | ICD-10-CM

## 2014-05-22 DIAGNOSIS — O26899 Other specified pregnancy related conditions, unspecified trimester: Secondary | ICD-10-CM

## 2014-05-22 DIAGNOSIS — N390 Urinary tract infection, site not specified: Secondary | ICD-10-CM

## 2014-05-22 LAB — URINALYSIS, ROUTINE W REFLEX MICROSCOPIC
Bilirubin Urine: NEGATIVE
Glucose, UA: NEGATIVE mg/dL
Hgb urine dipstick: NEGATIVE
Ketones, ur: NEGATIVE mg/dL
NITRITE: NEGATIVE
PH: 6.5 (ref 5.0–8.0)
Protein, ur: NEGATIVE mg/dL
UROBILINOGEN UA: 0.2 mg/dL (ref 0.0–1.0)

## 2014-05-22 LAB — URINE MICROSCOPIC-ADD ON

## 2014-05-22 MED ORDER — IBUPROFEN 600 MG PO TABS: ORAL_TABLET | ORAL | Status: DC

## 2014-05-22 MED ORDER — CYCLOBENZAPRINE HCL 10 MG PO TABS: 10.0000 mg | ORAL_TABLET | Freq: Three times a day (TID) | ORAL | Status: DC | PRN

## 2014-05-22 MED ORDER — CYCLOBENZAPRINE HCL 10 MG PO TABS
10.0000 mg | ORAL_TABLET | Freq: Three times a day (TID) | ORAL | Status: DC
  Administered 2014-05-22: 10 mg via ORAL
  Filled 2014-05-22: qty 1

## 2014-05-22 MED ORDER — IBUPROFEN 600 MG PO TABS
600.0000 mg | ORAL_TABLET | Freq: Four times a day (QID) | ORAL | Status: DC
  Administered 2014-05-22: 600 mg via ORAL
  Filled 2014-05-22: qty 1

## 2014-05-22 NOTE — Discharge Instructions (Signed)
Abdominal Pain During Pregnancy °Belly (abdominal) pain is common during pregnancy. Most of the time, it is not a serious problem. Other times, it can be a sign that something is wrong with the pregnancy. Always tell your doctor if you have belly pain. °HOME CARE °Monitor your belly pain for any changes. The following actions may help you feel better: °· Do not have sex (intercourse) or put anything in your vagina until you feel better. °· Rest until your pain stops. °· Drink clear fluids if you feel sick to your stomach (nauseous). Do not eat solid food until you feel better. °· Only take medicine as told by your doctor. °· Keep all doctor visits as told. °GET HELP RIGHT AWAY IF:  °· You are bleeding, leaking fluid, or pieces of tissue come out of your vagina. °· You have more pain or cramping. °· You keep throwing up (vomiting). °· You have pain when you pee (urinate) or have blood in your pee. °· You have a fever. °· You do not feel your baby moving as much. °· You feel very weak or feel like passing out. °· You have trouble breathing, with or without belly pain. °· You have a very bad headache and belly pain. °· You have fluid leaking from your vagina and belly pain. °· You keep having watery poop (diarrhea). °· Your belly pain does not go away after resting, or the pain gets worse. °MAKE SURE YOU:  °· Understand these instructions. °· Will watch your condition. °· Will get help right away if you are not doing well or get worse. °Document Released: 03/17/2009 Document Revised: 11/29/2012 Document Reviewed: 10/26/2012 °[employer]® Patient Information ©2015 [employer]. This information is not intended to replace advice given to you by your health care provider. Make sure you discuss any questions you have with your health care provider. ° °

## 2014-05-22 NOTE — MAU Note (Signed)
C/o stomach pain that started this AM @ 0530; pain stopped for a little while but re-occurred after 0630; denies any vaginal bleeding;

## 2014-05-22 NOTE — MAU Provider Note (Signed)
History   24 yo G4P2012 at 16 3/7 weeks presented after calling CNM with sudden onset pain in LLQ upon awakening and standing up.  Diminished with activity, then occurred again on way to work.  Had to stop car and call partner to come pick her up.  Denies leaking, discharge, bleeding, dysuria, fever, trauma, or any other issue.  Also notes pain extending into low back.  Pain worse with walking, turning, stretching left leg out completely, and standing, relieved with bending left knee upward.  Denies N/V, diarrhea, or any other sx.  Had NOB w/u 04/25/14, with 1st trimester screen [place] on 05/02/14.   Was treated for + enterococcus urine culture on 04/25/14 with Macrobid--patient feels she has had rash rxn to that med.  Completed med, but rash still present, using hydrocortisone for itching.  Patient Active Problem List   Diagnosis Date Noted  . Abdominal pain in pregnancy 05/22/2014  . H/O rapid labor 05/22/2014  . Enterococcus UTI at [facility] 05/22/2014  . Spotting affecting pregnancy 05/22/2014    Chief Complaint  Patient presents with  . Abdominal Pain   HPI:  See above  OB History    Gravida Para Term Preterm AB TAB SAB Ectopic Multiple Living   4 2 2  1  1   2       Past Medical History  Diagnosis Date  . Medical history non-contributory     Past Surgical History  Procedure Laterality Date  . No past surgeries      Family History  Problem Relation Age of Onset  . Diabetes Mother   . Heart disease Mother     History  Substance Use Topics  . Smoking status: Never Smoker   . Smokeless tobacco: Not on file  . Alcohol Use: No    Allergies: No Known Allergies  Prescriptions prior to admission  Medication Sig Dispense Refill Last Dose  . Prenatal Vit-Fe Fumarate-FA (PRENATAL MULTIVITAMIN) TABS tablet Take 1 tablet by mouth daily.    05/22/2014 at Unknown time  . acetaminophen (TYLENOL) 500 MG tablet Take 250 mg by mouth every 6 (six) hours as needed for mild pain.   03/29/2014  at 1300    ROS:  LLQ pain Physical Exam   Blood pressure 112/59, pulse 78, temperature 97.7 F (36.5 C), temperature source Oral, resp. rate 20, last menstrual period 01/23/2014.    Physical Exam  In mild distress from LLQ pain. Chest clear Heart RRR without murmur Abd gravid, mild tenderness in LLQ, RLQ WNL.  No rebound or guarding on right, mild on left.  Pain increased when left leg stretched out, relieved when left knee bent.   No suprapubic pain. No CVAT Pelvic--no d/c or bleeding, cervix closed, long. Ext WNL  FHR 160 per doppler. Bedside US shows +FM.  Results for orders placed or performed during the hospital encounter of 05/22/14 (from the past 24 hour(s))  Urinalysis, Routine w reflex microscopic     Status: Abnormal   Collection Time: 05/22/14  7:25 AM  Result Value Ref Range   Color, Urine YELLOW YELLOW   APPearance HAZY (A) CLEAR   Specific Gravity, Urine <1.005 (L) 1.005 - 1.030   pH 6.5 5.0 - 8.0   Glucose, UA NEGATIVE NEGATIVE mg/dL   Hgb urine dipstick NEGATIVE NEGATIVE   Bilirubin Urine NEGATIVE NEGATIVE   Ketones, ur NEGATIVE NEGATIVE mg/dL   Protein, ur NEGATIVE NEGATIVE mg/dL   Urobilinogen, UA 0.2 0.0 - 1.0 mg/dL   Nitrite NEGATIVE NEGATIVE  Leukocytes, UA SMALL (A) NEGATIVE  Urine microscopic-add on     Status: Abnormal   Collection Time: 05/22/14  7:25 AM  Result Value Ref Range   Squamous Epithelial / LPF FEW (A) RARE   WBC, UA 0-2 <3 WBC/hpf   Bacteria, UA RARE RARE   Urine to culture  ED Course  Assessment: IUP at 16 3/7 weeks Likely round ligament pain on left  Plan: Ibuprophen and Flexeril now, with Rxs for both sent to pharmacy. Comfort measures for round ligament pain reviewed. Discussed work issues impacting pregnancy--steel toed shoes causing back pain, lack of regular breaks, pressure for overtime hours.  Will do note for work to reflect recommendations for wearing regular, supportive shoes, need for routine breaks, and no  overtime. OOW today and tomorrow to allow for rest and meds. Keep scheduled visit on 2/12, call prn. Rx Ibuprophen 600 mg po q 6 hours x 48 hours, then prn. Rx Flexeril 10 mg po TID prn.   [name] CNM, MSN 05/22/2014 9:00 AM

## 2014-05-23 LAB — URINE CULTURE
CULTURE: NO GROWTH
Colony Count: NO GROWTH

## 2014-07-01 ENCOUNTER — Encounter (HOSPITAL_COMMUNITY): Payer: Self-pay | Admitting: *Deleted

## 2014-07-01 ENCOUNTER — Inpatient Hospital Stay (HOSPITAL_COMMUNITY)
Admission: AD | Admit: 2014-07-01 | Discharge: 2014-07-01 | Disposition: A | Discharge status: Home / Self Care | Payer: Medicaid Other | Source: Ambulatory Visit | Attending: Obstetrics and Gynecology | Admitting: Obstetrics and Gynecology

## 2014-07-01 DIAGNOSIS — O23592 Infection of other part of genital tract in pregnancy, second trimester: Secondary | ICD-10-CM | POA: Diagnosis not present

## 2014-07-01 DIAGNOSIS — O9989 Other specified diseases and conditions complicating pregnancy, childbirth and the puerperium: Secondary | ICD-10-CM | POA: Diagnosis not present

## 2014-07-01 DIAGNOSIS — N760 Acute vaginitis: Secondary | ICD-10-CM | POA: Insufficient documentation

## 2014-07-01 DIAGNOSIS — B9689 Other specified bacterial agents as the cause of diseases classified elsewhere: Secondary | ICD-10-CM | POA: Diagnosis not present

## 2014-07-01 DIAGNOSIS — R109 Unspecified abdominal pain: Secondary | ICD-10-CM | POA: Diagnosis not present

## 2014-07-01 DIAGNOSIS — Z3A22 22 weeks gestation of pregnancy: Secondary | ICD-10-CM | POA: Insufficient documentation

## 2014-07-01 DIAGNOSIS — M549 Dorsalgia, unspecified: Secondary | ICD-10-CM | POA: Insufficient documentation

## 2014-07-01 DIAGNOSIS — O26899 Other specified pregnancy related conditions, unspecified trimester: Secondary | ICD-10-CM

## 2014-07-01 LAB — URINALYSIS, ROUTINE W REFLEX MICROSCOPIC
BILIRUBIN URINE: NEGATIVE
Glucose, UA: >1000 mg/dL — AB
Hgb urine dipstick: NEGATIVE
KETONES UR: NEGATIVE mg/dL
Leukocytes, UA: NEGATIVE
NITRITE: NEGATIVE
PH: 6 (ref 5.0–8.0)
Protein, ur: NEGATIVE mg/dL
Specific Gravity, Urine: 1.025 (ref 1.005–1.030)
Urobilinogen, UA: 1 mg/dL (ref 0.0–1.0)

## 2014-07-01 LAB — WET PREP, GENITAL
TRICH WET PREP: NONE SEEN
Yeast Wet Prep HPF POC: NONE SEEN

## 2014-07-01 LAB — URINE MICROSCOPIC-ADD ON

## 2014-07-01 LAB — FETAL FIBRONECTIN: Fetal Fibronectin: POSITIVE — AB

## 2014-07-01 MED ORDER — OXYCODONE-ACETAMINOPHEN 5-325 MG PO TABS
1.0000 | ORAL_TABLET | Freq: Once | ORAL | Status: AC
  Administered 2014-07-01: 2 via ORAL
  Filled 2014-07-01: qty 2

## 2014-07-01 MED ORDER — METRONIDAZOLE 500 MG PO TABS: 500.0000 mg | ORAL_TABLET | Freq: Two times a day (BID) | ORAL | Status: DC

## 2014-07-01 NOTE — MAU Provider Note (Signed)
History   Continuation and completion of visit started by V. Standard, CNM Aletha Allebach CompeanMaravill is a 24 y.o. [id number] at [facility] who presents for abdominal and back pain.  Patient states pain is intermittent and occurs concurrently.  Patient states back pain is in lower back and abdominal pain starts at top of stomach and radiates to pelvic area.  Patient reports good fetal movment and denies contractions, LOF, and VB.  Patient denies issues with urination and changes in vaginal discharge.    Patient Active Problem List   Diagnosis Date Noted  . Abdominal pain in pregnancy 05/22/2014  . H/O rapid labor 05/22/2014  . Enterococcus UTI at [facility] 05/22/2014  . Spotting affecting pregnancy 05/22/2014    Chief Complaint  Patient presents with  . Abdominal Pain   HPI  OB History    Gravida Para Term Preterm AB TAB SAB Ectopic Multiple Living   4 2 2  1  1   2       Past Medical History  Diagnosis Date  . Medical history non-contributory     Past Surgical History  Procedure Laterality Date  . No past surgeries      Family History  Problem Relation Age of Onset  . Diabetes Mother   . Heart disease Mother     History  Substance Use Topics  . Smoking status: Never Smoker   . Smokeless tobacco: Not on file  . Alcohol Use: No    Allergies: No Known Allergies  Prescriptions prior to admission  Medication Sig Dispense Refill Last Dose  . acetaminophen (TYLENOL) 325 MG tablet Take 650 mg by mouth every 6 (six) hours as needed for headache.   Past Week at Unknown time  . cetirizine (ZYRTEC) 10 MG tablet Take 10 mg by mouth daily as needed for allergies.   Past Week at Unknown time  . Prenatal Vit-Fe Fumarate-FA (PRENATAL MULTIVITAMIN) TABS tablet Take 1 tablet by mouth daily.    07/01/2014 at Unknown time  . cyclobenzaprine (FLEXERIL) 10 MG tablet Take 1 tablet (10 mg total) by mouth 3 (three) times daily as needed for muscle spasms. (Patient not taking: Reported on 07/01/2014) 30  tablet 0 more than one month  . ibuprofen (ADVIL,MOTRIN) 600 MG tablet Take 1 tablet every 6 hours for 2 days, then as needed for pain. (Patient not taking: Reported on 07/01/2014) 30 tablet 0 more than one month    ROS  See HPI Above Physical Exam   Blood pressure 126/70, pulse 87, temperature 98 F (36.7 C), temperature source Oral, resp. rate 22, last menstrual period 01/23/2014.  Results for orders placed or performed during the hospital encounter of 07/01/14 (from the past 24 hour(s))  Urinalysis, Routine w reflex microscopic     Status: Abnormal   Collection Time: 07/01/14  6:18 PM  Result Value Ref Range   Color, Urine YELLOW YELLOW   APPearance CLEAR CLEAR   Specific Gravity, Urine 1.025 1.005 - 1.030   pH 6.0 5.0 - 8.0   Glucose, UA >1000 (A) NEGATIVE mg/dL   Hgb urine dipstick NEGATIVE NEGATIVE   Bilirubin Urine NEGATIVE NEGATIVE   Ketones, ur NEGATIVE NEGATIVE mg/dL   Protein, ur NEGATIVE NEGATIVE mg/dL   Urobilinogen, UA 1.0 0.0 - 1.0 mg/dL   Nitrite NEGATIVE NEGATIVE   Leukocytes, UA NEGATIVE NEGATIVE  Urine microscopic-add on     Status: Abnormal   Collection Time: 07/01/14  6:18 PM  Result Value Ref Range   Squamous Epithelial / LPF RARE  RARE   WBC, UA 0-2 <3 WBC/hpf   RBC / HPF 0-2 <3 RBC/hpf   Bacteria, UA FEW (A) RARE  Wet prep, genital     Status: Abnormal   Collection Time: 07/01/14  6:55 PM  Result Value Ref Range   Yeast Wet Prep HPF POC NONE SEEN NONE SEEN   Trich, Wet Prep NONE SEEN NONE SEEN   Clue Cells Wet Prep HPF POC FEW (A) NONE SEEN   WBC, Wet Prep HPF POC FEW (A) NONE SEEN  Fetal fibronectin     Status: Abnormal   Collection Time: 07/01/14  6:55 PM  Result Value Ref Range   Fetal Fibronectin POSITIVE (A) NEGATIVE    Physical Exam Deferred--Completed by V. Standard  FHR: 155 by doppler UC: None graphed or palpated ED Course  Assessment: IUP at [facility] Back Pain Abdominal Pain  Plan: -Patient given percocet per V.Standard plan of  care -Consult with Dr. Kathlen Mody regarding +fFN -Instructed to recheck cervix and will advise on most up to date research regarding steroid dosage at [redacted] wk gestation -Advised to recheck cervix, assess and treat pain, and give BMZ dosage if cervical change is noted.  Otherwise discharge to home with f/u in office this week  Follow Up (2144) -Interpreter at bedside, results explained to husband and family -Questions and concerns addressed -SVE remains the same -Patient out of work until next Monday or f/u evaluation, whichever occurs first -Labor precautions -Educated regarding bacterial vaginosis, vaginal hygiene, and treatment -Rx for flagyl 500mg  BID x 7days 0RF -Will have office staff call patient and make f/u appt -Encouraged to call if any questions or concerns arise prior to next scheduled office visit.  -Discharged to home in good condition  [name], [place], MSN 07/01/2014 8:14 PM

## 2014-07-01 NOTE — MAU Provider Note (Signed)
[name] is a 24 y.o. G4P2 at 22.1 weeks   History     Patient Active Problem List   Diagnosis Date Noted  . Abdominal pain in pregnancy 05/22/2014  . H/O rapid labor 05/22/2014  . Enterococcus UTI at [facility] 05/22/2014  . Spotting affecting pregnancy 05/22/2014    Chief Complaint  Patient presents with  . Abdominal Pain   HPI  OB History    Gravida Para Term Preterm AB TAB SAB Ectopic Multiple Living   4 2 2  1  1   2       Past Medical History  Diagnosis Date  . Medical history non-contributory     Past Surgical History  Procedure Laterality Date  . No past surgeries      Family History  Problem Relation Age of Onset  . Diabetes Mother   . Heart disease Mother     History  Substance Use Topics  . Smoking status: Never Smoker   . Smokeless tobacco: Not on file  . Alcohol Use: No    Allergies: No Known Allergies  Prescriptions prior to admission  Medication Sig Dispense Refill Last Dose  . acetaminophen (TYLENOL) 500 MG tablet Take 250 mg by mouth every 6 (six) hours as needed for mild pain.   03/29/2014 at 1300  . cyclobenzaprine (FLEXERIL) 10 MG tablet Take 1 tablet (10 mg total) by mouth 3 (three) times daily as needed for muscle spasms. 30 tablet 0   . ibuprofen (ADVIL,MOTRIN) 600 MG tablet Take 1 tablet every 6 hours for 2 days, then as needed for pain. 30 tablet 0   . Prenatal Vit-Fe Fumarate-FA (PRENATAL MULTIVITAMIN) TABS tablet Take 1 tablet by mouth daily.    05/22/2014 at Unknown time    ROS See HPI above, all other systems are negative  Physical Exam   Blood pressure 126/70, pulse 87, temperature 98 F (36.7 C), temperature source Oral, resp. rate 22, last menstrual period 01/23/2014.  Physical Exam Ext:  WNL ABD: Soft, non tender to palpation, no rebound or guarding SVE: dimple/thich/high   ED Course  Assessment: IUP at  22.1weeks Membranes: intact FHR: 155 CTX:  unsure   Plan:  Labs: GC/CL, Wet prep, fFN,   Toco Percocet x1   Laurren Lepkowski, CNM, MSN 07/01/2014. 6:59 PM

## 2014-07-01 NOTE — Discharge Instructions (Signed)
Vaginosis bacteriana (Bacterial Vaginosis) La vaginosis bacteriana es una infeccin vaginal que perturba el equilibrio normal de las bacterias que se encuentran en la vagina. Es el resultado de un crecimiento excesivo de ciertas bacterias. Esta es la infeccin vaginal ms frecuente en mujeres en edad reproductiva. El tratamiento es importante para prevenir complicaciones, especialmente en mujeres embarazadas, dado que puede causar un parto prematuro. CAUSAS  La vaginosis bacteriana se origina por un aumento de bacterias nocivas que, generalmente, estn presentes en cantidades ms pequeas en la vagina. Varios tipos diferentes de bacterias pueden causar esta afeccin. Sin embargo, la causa de su desarrollo no se comprende totalmente. [place] o comportamientos pueden exponerlo a un mayor riesgo de desarrollar vaginosis bacteriana, entre los que se incluyen:  Tener una nueva pareja sexual o mltiples parejas sexuales.  Las duchas vaginales  El uso del DIU (dispositivo intrauterino) como mtodo anticonceptivo. El contagio no se produce en baos, por ropas de cama, en piscinas o por contacto con objetos. SIGNOS Y SNTOMAS  Algunas mujeres que padecen vaginosis bacteriana no presentan signos ni sntomas. Los sntomas ms comunes son:  Secrecin vaginal de color grisceo.  Secrecin vaginal con olor similar al [employer], especialmente despus de Retail banker.  Picazn o sensacin de ardor en la vagina o la vulva.  Ardor o dolor al [employer]. DIAGNSTICO  Su mdico analizar su historia clnica y le examinar la vagina para detectar signos de vaginosis bacteriana. Puede tomarle Truddie Coco de flujo vaginal. Su mdico examinar esta muestra con un microscopio para controlar las bacterias y clulas anormales. Tambin puede realizarse un anlisis del pH vaginal.  TRATAMIENTO  La vaginosis bacteriana puede tratarse con antibiticos, en forma de comprimidos o  de crema vaginal. Puede indicarse una segunda tanda de antibiticos si la afeccin se repite despus del tratamiento.  Penasco solo medicamentos de venta libre o recetados, segn las indicaciones del mdico.  Si le han recetado antibiticos, tmelos como se le indic. Asegrese de que finaliza la prescripcin completa aunque se sienta mejor.  No mantenga relaciones sexuales Animator.  Comunique a sus compaeros sexuales que sufre una infeccin vaginal. Deben consultar a su mdico y recibir tratamiento si tienen problemas, como picazn o una erupcin cutnea leve.  Practique el sexo seguro usando preservativos y tenga un nico compaero sexual. SOLICITE ATENCIN MDICA SI:   Sus sntomas no mejoran despus de 3 das de [place].  Aumenta la secrecin o Conservation officer, historic buildings.  Tiene fiebre. ASEGRESE DE QUE:   Comprende estas instrucciones.  Controlar su afeccin.  Recibir ayuda de inmediato si no mejora o si empeora. PARA OBTENER MS INFORMACIN  Centros para el control y la prevencin de Probation officer for Disease Control and Prevention, CDC): AppraiserFraud.fi Asociacin Estadounidense de la Salud Sexual (American Sexual Health Association, SHA): www.ashastd.org  Document Released: 07/06/2007 Document Revised: 01/17/2013 [facility] Patient Information 2015 [place], [place]. This information is not intended to replace advice given to you by your health care provider. Make sure you discuss any questions you have with your health care provider. Dolor abdominal durante el embarazo (Abdominal Pain During Pregnancy) El dolor de vientre (abdominal) es habitual durante el embarazo. Generalmente no se trata de un problema grave. Otras veces puede ser un signo de que algo no anda bien. Siempre comunquese con su mdico si tiene dolor abdominal. CUIDADOS EN EL HOGAR Controle el dolor para ver si hay cambios. Las indicaciones que siguen  pueden ayudarla a sentirse mejor:  Optician, dispensing Electronics engineer) ni se coloque nada dentro de la vagina hasta que se sienta mejor.  Haga reposo hasta que el dolor se calme.  Si siente ganas de vomitar (nuseas ) beba lquidos claros. No consuma alimentos slidos hasta que se sienta mejor.  Slo tome los medicamentos que le haya indicado su mdico.  Cumpla con las visitas al mdico segn las indicaciones. SOLICITE AYUDA DE INMEDIATO SI:   Tiene un sangrado, pierde lquido o elimina trozos de tejido por la vagina.  Siente ms dolor o clicos.  Comienza a vomitar.  Siente dolor al orinar u observa sangre en la orina.  Tiene fiebre.  No siente que el beb se mueva mucho.  Se siente muy dbil o cree que va a desmayarse.  Tiene dificultad para respirar con o sin dolor en el vientre.  Siente un dolor de cabeza muy intenso y Social research officer, government en el vientre.  Observa que sale un lquido por la vagina y tiene dolor abdominal.  La materia fecal es lquida (diarrea).  El dolor en el viente no desaparece, o empeora, luego de hacer reposo. ASEGRESE DE QUE:   Comprende estas instrucciones.  Controlar su afeccin.  Recibir ayuda de inmediato si no mejora o si empeora. Document Released: 12/09/2010 Document Revised: 11/29/2012 [facility] Patient Information 2015 [place]. This information is not intended to replace advice given to you by your health care provider. Make sure you discuss any questions you have with your health care provider. Dolor de Doctor, general practice  (Back Pain in Pregnancy)  El dolor de espalda es habitual durante el embarazo. Ocurre en aproximadamente la mitad de todos los [place]. Es importante para usted y su beb que permanezca activa durante el [place].Si siente que Conservation officer, historic buildings de espalda es lo que no le permite mantenerse activa o dormir bien, Public affairs consultant a su mdico. La causa del dolor de espalda puede deberse a varios factores relacionados con  los cambios durante el [place].Afortunadamente, excepto que haya tenido problemas de espalda antes del [place], es probable que el dolor mejore despus del [place]. El dolor lumbar por lo general ocurre entre el quinto y sptimo mes del Media planner. Sin embargo, puede ocurrir [place] primeros meses. Otros factores que aumentan el riesgo son:   Problemas previos en la espalda.  Lesiones en la espalda.  Tener gemelos o embarazos mltiples.  Tos persistente.  El estrs.  Movimientos repetitivos relacionados con Leander Rams.  Enfermedad muscular o de la columna vertebral en la espalda.  Antecedentes familiares de problemas de espalda, rotura (hernia) de discos u osteoporosis.  Depresin, ansiedad y crisis de [place]. CAUSAS   En las embarazadas, el cuerpo produce una hormona llamada relaxina. Esta hormona hace que los ligamentos que conectan la zona lumbar y los huesos del pubis sean ms flexibles. Esta flexibilidad permite que el beb nazca con ms facilidad. Cuando los ligamentos estn relajados, los msculos tienen que trabajar ms para apoyar la espalda. El dolor en la espalda puede deberse al cansancio muscular. El dolor tambin puede tener su causa en la irritacin de los tejidos de a espalda que se irritan ya que estn recibiendo menos apoyo.  A medida que el beb crece, ejerce presin [employer] nervios y los vasos sanguneos de la pelvis. Esto causa dolor de espalda.  A medida que el beb crece y aumenta el peso durante el [place], el tero presiona los msculos del estmago hacia adelante y cambia su centro de  gravedad. Esto hace que los msculos de la espalda deban trabajar ms para mantener una buena [place]. SNTOMAS  Dolor lumbar durante el embarazo Generalmente se produce en la zona o por arriba de la cintura en el centro de la espalda. Puede haber dolor y entumecimiento que se irradia hacia la pierna o el pie. Es similar al dolor de espalda baja experimentada por las mujeres  no embarazadas. Por lo general, aumenta al [employer] de pie o sentada por largos perodos de [place], o con levantamientos repetitivos Tambin puede haber sensibilidad en los msculos en la zona superior de la espalda .  Dolor plvico posterior Agricultural consultant en la parte posterior de la pelvis es ms frecuente que el dolor lumbar en el embarazo. Se trata de un dolor profundo que se siente a un lado en la cintura, o a travs del cxis (sacro), o en ambos lugares. Puede sentir dolor en uno o ambos lados Este dolor tambin puede sentirse en las nalgas y el dorso de los muslos Tambin puede haber dolor pbico y en la ingle. El dolor no se mejora rpidamente con el reposo, y [place] puede haber rigidez matutina. Muchas actividades pueden causarlo. Un buen estado fsico antes y durante el embarazo puede o no prevenir este problema. Las contracciones del parto suelen aparecer cada 1 a 2 minutos, tienen una duracin de aproximadamente 1 minuto, e implica una sensacin de empujar o presin en la pelvis. Sin embargo, si usted est a trmino con Water quality scientist, Conservation officer, historic buildings constante en la zona lumbar puede indicar el comienzo de un parto prematuro, y usted debe ser consciente de ello.  DIAGNSTICO  No se deben tomar radiografas de la [employer] las primeras 12 a 14 semanas del embarazo y durante el resto del Media planner, slo cuando sea absolutamente necesario. La resonancia magntica no emite radiacin y es un estudio seguro durante el Media planner. Pero tambin se deben hacer solamente cuando sea absolutamente necesario.  INSTRUCCIONES PARA EL CUIDADO EN EL HOGAR   Realice actividad fsica segn las indicaciones del mdico. El ejercicio es la manera ms eficaz para prevenir o tratar Conservation officer, historic buildings de espalda. Si tiene un problema en la espalda, es especialmente importante evitar los deportes que requieran de movimientos corporales rpidos. La natacin y las caminatas son las mejores actividades.  No permanezca  sentada o de pie en el mismo lugar durante largos perodos.  No use tacos altos.  Sintese en la silla con una buena postura. Use una almohada en su espalda baja si es necesario. Asegrese de que su cabeza descansa sobre sus hombros y no est colgando hacia delante.  Trate de dormir de lado, de preferencia el lado izquierdo, con una o [employer] piernas. Si est dolorida despus de una noche de descanso, la cama puede ser [employer].Trate de colocar una tabla entre el colchn y el somier.  Prstele atencin a su cuerpo cuando se levante.Si siente dolor,pida ayuda o trate de doblar las rodillas ms para [employer] de las piernas en lugar de los msculos de la espalda. Pngase en cuclillas al levantar algo del suelo. No se doble.  Consuma una dieta saludable. Trate de aumentar de peso dentro de las recomendaciones de su mdico.  Utilice compresas de calor o fro de 3 a 4 veces al da durante 15 minutos para Glass blower/designer.  Solo tome medicamentos que se pueden comprar sin receta o recetados para Conservation officer, historic buildings, Tree surgeon o fiebre, como le indica el mdico.  Dolor de espaldas repentino (agudo).  Haga reposo en cama slo en caso de los episodios ms extremos y agudos de Social research officer, government. El reposo prolongado en cama de ms de 48 horas agravar su trastorno.  El hielo es muy efectivo en los problemas agudos.  Ponga el hielo en una bolsa plstica.  Colquese una toalla entre la piel y la bolsa de hielo.  Deje el hielo durante 10 a 20 minutos cada 2 horas o segn lo nesecite, mientras se encuentre despierta.  Las compresas de calor durante 30 minutos antes de las actividades tambin puede ayudar. Dolor crnico en la espalda. Consulte a su mdico si el dolor es continuo. El mdico podr ayudarla o derivarla para que realice los ejercicios y trabajos de fortalecimiento apropiados. Con un buen entrenamiento fsico, podr evitar la mayor parte de los [place]. En algunos casos, la  causa es un problema ms grave. Debe ser controlada inmediatamente si aparecen nuevos problemas. El mdico tambin podr recomendar:   Una faja de maternidad.  Un arns elstico.  Un cors para la espalda.  Un masajista o acupuntura. SOLICITE ATENCIN MDICA SI:   No puede Personal assistant de sus actividades diarias, an tomando los medicamentos para Personnel officer.  Gari Crown ser derivada a un fisioterapeuta o quiroprxico.  Gari Crown intentar con acupuntura. SOLICITE ATENCIN [place] DE INMEDIATO SI:   Siente entumecimiento, hormigueo, debilidad o problemas con el uso de los brazos o las piernas.  Siente un dolor de espalda muy intenso que no se alivia con medicamentos.  Tiene modificaciones repentinas en el control de la vejiga o el intestino.  Aumenta el dolor en otras partes del cuerpo.  Siente que le falta el aire, se marea o sufre un [place].  Tiene nuseas, vmitos o sudoracin.  Siente un dolor en la espalda similar al del [place] de [place].  Cuando aparece [employer], rompe la bolsa de aguas o tiene un sangrado vaginal.  El dolor o el adormecimiento se extienden hacia la pierna.  El dolor aparece despus de una cada.  Siente dolor de un solo lado. Podra tener clculos renales.  Observa sangre en la orina. Podra tener una infeccin en la vejiga o clculos renales.  Siente dolor y aparecen ronchas. Podra tener culebrilla. El dolor de espalda es bastante frecuente durante el embarazo pero no debe aceptarse slo como parte del [place]. Siempre debe tratarse lo ms rpidamente posible. Har que su embarazo sea lo ms placentero posible.  Document Released: 12/09/2010 Document Revised: 06/21/2011 [facility] Patient Information 2015 [place]. This information is not intended to replace advice given to you by your health care provider. Make sure you discuss any questions you have with your health care provider.

## 2014-07-01 NOTE — MAU Note (Signed)
Lower abd pain started "a few minutes ago."  Pt states she was eating when the pain started, vomited, barely able to walk - pain is intermittent.  Pt denies bleeding or LOF.

## 2014-07-02 LAB — GC/CHLAMYDIA PROBE AMP (~~LOC~~) NOT AT ARMC
Chlamydia: NEGATIVE
Neisseria Gonorrhea: NEGATIVE

## 2014-08-06 ENCOUNTER — Inpatient Hospital Stay (HOSPITAL_COMMUNITY)
Admission: AD | Admit: 2014-08-06 | Discharge: 2014-08-07 | Disposition: A | Discharge status: Home / Self Care | Payer: Medicaid Other | Source: Ambulatory Visit | Attending: Obstetrics and Gynecology | Admitting: Obstetrics and Gynecology

## 2014-08-06 DIAGNOSIS — R109 Unspecified abdominal pain: Secondary | ICD-10-CM

## 2014-08-06 DIAGNOSIS — R102 Pelvic and perineal pain: Secondary | ICD-10-CM | POA: Diagnosis present

## 2014-08-06 DIAGNOSIS — O9989 Other specified diseases and conditions complicating pregnancy, childbirth and the puerperium: Secondary | ICD-10-CM | POA: Insufficient documentation

## 2014-08-06 DIAGNOSIS — Z3A27 27 weeks gestation of pregnancy: Secondary | ICD-10-CM | POA: Diagnosis not present

## 2014-08-06 DIAGNOSIS — O26899 Other specified pregnancy related conditions, unspecified trimester: Secondary | ICD-10-CM

## 2014-08-06 LAB — URINALYSIS, ROUTINE W REFLEX MICROSCOPIC
BILIRUBIN URINE: NEGATIVE
Glucose, UA: NEGATIVE mg/dL
Hgb urine dipstick: NEGATIVE
Ketones, ur: NEGATIVE mg/dL
LEUKOCYTES UA: NEGATIVE
NITRITE: NEGATIVE
Protein, ur: NEGATIVE mg/dL
Urobilinogen, UA: 0.2 mg/dL (ref 0.0–1.0)
pH: 6 (ref 5.0–8.0)

## 2014-08-06 NOTE — MAU Note (Signed)
Pt reports lower abd pain and pressure today. Denies bleeding or ROM. Reports urinary frequency.

## 2014-08-07 ENCOUNTER — Encounter (HOSPITAL_COMMUNITY): Payer: Self-pay | Admitting: *Deleted

## 2014-08-07 MED ORDER — OXYCODONE-ACETAMINOPHEN 5-325 MG PO TABS: 1.0000 | ORAL_TABLET | Freq: Four times a day (QID) | ORAL | Status: DC | PRN

## 2014-08-07 MED ORDER — OXYCODONE-ACETAMINOPHEN 5-325 MG PO TABS
2.0000 | ORAL_TABLET | Freq: Once | ORAL | Status: AC
Start: 2014-08-07 — End: 2014-08-07
  Administered 2014-08-07: 2 via ORAL
  Filled 2014-08-07: qty 2

## 2014-08-07 NOTE — MAU Provider Note (Signed)
History    [name] is a 24 y.o. [id number] at 27.3wks who presents, after phone call, for pelvic pain.  Patient states she has been out of work and pain was getting better. However, she returned to work, two days ago, and has had pain resumed. Patient unsure if she is contracting and admits that she has no pain with laying down. Patient reports taking 650mg  of tylenol at 8pm, with no results.  Patient denies issues with urination, constipation, or recent sickness.  Patient reports active fetus and denies LOF and VB.   Patient Active Problem List   Diagnosis Date Noted  . Abdominal pain in pregnancy 05/22/2014  . H/O rapid labor 05/22/2014  . Enterococcus UTI at [facility] 05/22/2014  . Spotting affecting pregnancy 05/22/2014    No chief complaint on file.  HPI  OB History    Gravida Para Term Preterm AB TAB SAB Ectopic Multiple Living   4 2 2  1  1   2       History reviewed. No pertinent past medical history.  Past Surgical History  Procedure Laterality Date  . No past surgeries      Family History  Problem Relation Age of Onset  . Diabetes Mother   . Heart disease Mother     History  Substance Use Topics  . Smoking status: Never Smoker   . Smokeless tobacco: Not on file  . Alcohol Use: No    Allergies: No Known Allergies  Prescriptions prior to admission  Medication Sig Dispense Refill Last Dose  . acetaminophen (TYLENOL) 325 MG tablet Take 650 mg by mouth every 6 (six) hours as needed for headache.   08/05/2014 at Unknown time  . Prenatal Vit-Fe Fumarate-FA (PRENATAL MULTIVITAMIN) TABS tablet Take 1 tablet by mouth daily.    08/05/2014 at Unknown time  . cetirizine (ZYRTEC) 10 MG tablet Take 10 mg by mouth daily as needed for allergies.   More than a month at Unknown time  . cyclobenzaprine (FLEXERIL) 10 MG tablet Take 1 tablet (10 mg total) by mouth 3 (three) times daily as needed for muscle spasms. (Patient not taking: Reported on 07/01/2014) 30 tablet 0 More  than a month at Unknown time  . ibuprofen (ADVIL,MOTRIN) 600 MG tablet Take 1 tablet every 6 hours for 2 days, then as needed for pain. (Patient not taking: Reported on 07/01/2014) 30 tablet 0 More than a month at Unknown time  . metroNIDAZOLE (FLAGYL) 500 MG tablet Take 1 tablet (500 mg total) by mouth 2 (two) times daily. 14 tablet 0 More than a month at Unknown time    ROS  See HPI Above Physical Exam   Blood pressure 124/66, pulse 68, temperature 97.7 F (36.5 C), temperature source Oral, resp. rate 20, height 5' (1.524 m), weight 80.91 kg (178 lb 6 oz), last menstrual period 01/23/2014, SpO2 100 %.  Results for orders placed or performed during the hospital encounter of 08/06/14 (from the past 24 hour(s))  Urinalysis, Routine w reflex microscopic     Status: Abnormal   Collection Time: 08/06/14 11:17 PM  Result Value Ref Range   Color, Urine YELLOW YELLOW   APPearance CLEAR CLEAR   Specific Gravity, Urine <1.005 (L) 1.005 - 1.030   pH 6.0 5.0 - 8.0   Glucose, UA NEGATIVE NEGATIVE mg/dL   Hgb urine dipstick NEGATIVE NEGATIVE   Bilirubin Urine NEGATIVE NEGATIVE   Ketones, ur NEGATIVE NEGATIVE mg/dL   Protein, ur NEGATIVE NEGATIVE mg/dL   Urobilinogen,  UA 0.2 0.0 - 1.0 mg/dL   Nitrite NEGATIVE NEGATIVE   Leukocytes, UA NEGATIVE NEGATIVE    Physical Exam  Constitutional: She is oriented to person, place, and time. She appears well-developed and well-nourished.  HENT:  Head: Normocephalic and atraumatic.  Eyes: EOM are normal.  Neck: Normal range of motion.  Cardiovascular: Normal rate, regular rhythm and normal heart sounds.   Respiratory: Effort normal and breath sounds normal.  GI: Soft. Bowel sounds are normal.  Genitourinary: Vagina normal.  SVE: 0/Th/-3  Musculoskeletal: Normal range of motion.  Neurological: She is alert and oriented to person, place, and time.  Skin: Skin is warm and dry.   FHR: 145 bpm, Mod Var, -Decels, +Accels UC: None graphed or palpated ED  Course  Assessment: IUP at 27.3wks Cat I FT Pelvic Pain  Plan: -PE as above -Patient requests pain medication -Percocet 2 tablets now  Follow Up (0220) -Patient reports relief with percocet -Requests and given work excuse -Rx for percocet 1-2 Q6hrs PRN Disp 30, 0 RF -Questions and concerns addressed -Keep appt as scheduled: 08/14/2014 -Encouraged to call if any questions or concerns arise prior to next scheduled office visit.  -Discharged to home in stable condition  Bijon Mineer, [place], MSN 08/07/2014 12:13 AM

## 2014-08-07 NOTE — Discharge Instructions (Signed)

## 2014-09-21 LAB — OB RESULTS CONSOLE GBS: GBS: NEGATIVE

## 2014-10-02 ENCOUNTER — Encounter (HOSPITAL_COMMUNITY): Payer: Self-pay

## 2014-10-02 ENCOUNTER — Inpatient Hospital Stay (HOSPITAL_COMMUNITY)
Admission: AD | Admit: 2014-10-02 | Discharge: 2014-10-02 | Disposition: A | Discharge status: Home / Self Care | Payer: Medicaid Other | Source: Ambulatory Visit | Attending: Obstetrics and Gynecology | Admitting: Obstetrics and Gynecology

## 2014-10-02 DIAGNOSIS — Z3A35 35 weeks gestation of pregnancy: Secondary | ICD-10-CM | POA: Diagnosis not present

## 2014-10-02 LAB — URINALYSIS, ROUTINE W REFLEX MICROSCOPIC
Bilirubin Urine: NEGATIVE
Glucose, UA: NEGATIVE mg/dL
Hgb urine dipstick: NEGATIVE
Ketones, ur: NEGATIVE mg/dL
Nitrite: NEGATIVE
PH: 6 (ref 5.0–8.0)
Protein, ur: NEGATIVE mg/dL
SPECIFIC GRAVITY, URINE: 1.01 (ref 1.005–1.030)
Urobilinogen, UA: 0.2 mg/dL (ref 0.0–1.0)

## 2014-10-02 LAB — URINE MICROSCOPIC-ADD ON

## 2014-10-02 LAB — OB RESULTS CONSOLE GBS: STREP GROUP B AG: NEGATIVE

## 2014-10-02 MED ORDER — BETAMETHASONE SOD PHOS & ACET 6 (3-3) MG/ML IJ SUSP
12.5000 mg | Freq: Every day | INTRAMUSCULAR | Status: DC
  Administered 2014-10-02: 12.5 mg via INTRAMUSCULAR
  Filled 2014-10-02 (×2): qty 2.1

## 2014-10-02 MED ORDER — BETAMETHASONE SOD PHOS & ACET 6 (3-3) MG/ML IJ SUSP
12.0000 mg | Freq: Every day | INTRAMUSCULAR | Status: DC
  Filled 2014-10-02: qty 2

## 2014-10-02 MED ORDER — BUTORPHANOL TARTRATE 1 MG/ML IJ SOLN
1.0000 mg | INTRAMUSCULAR | Status: DC | PRN
  Administered 2014-10-02: 1 mg via INTRAVENOUS
  Filled 2014-10-02: qty 1

## 2014-10-02 MED ORDER — LACTATED RINGERS IV BOLUS (SEPSIS)
500.0000 mL | Freq: Once | INTRAVENOUS | Status: AC
  Administered 2014-10-02: 500 mL via INTRAVENOUS

## 2014-10-02 MED ORDER — PROMETHAZINE HCL 25 MG/ML IJ SOLN
12.5000 mg | Freq: Four times a day (QID) | INTRAMUSCULAR | Status: DC | PRN
  Administered 2014-10-02: 12.5 mg via INTRAVENOUS
  Filled 2014-10-02: qty 1

## 2014-10-02 NOTE — MAU Note (Signed)
Onset of contractions since yesterday was at office today was told having them every 3 minutes.

## 2014-10-02 NOTE — Discharge Instructions (Signed)
Braxton Hicks Contractions °Contractions of the uterus can occur throughout pregnancy. Contractions are not always a sign that you are in labor.  °WHAT ARE BRAXTON HICKS CONTRACTIONS?  °Contractions that occur before labor are called Braxton Hicks contractions, or false labor. Toward the end of pregnancy (32-34 weeks), these contractions can develop more often and may become more forceful. This is not true labor because these contractions do not result in opening (dilatation) and thinning of the cervix. They are sometimes difficult to tell apart from true labor because these contractions can be forceful and people have different pain tolerances. You should not feel embarrassed if you go to the hospital with false labor. Sometimes, the only way to tell if you are in true labor is for your health care provider to look for changes in the cervix. °If there are no prenatal problems or other health problems associated with the pregnancy, it is completely safe to be sent home with false labor and await the onset of true labor. °HOW CAN YOU TELL THE DIFFERENCE BETWEEN TRUE AND FALSE LABOR? °False Labor °· The contractions of false labor are usually shorter and not as hard as those of true labor.   °· The contractions are usually irregular.   °· The contractions are often felt in the front of the lower abdomen and in the groin.   °· The contractions may go away when you walk around or change positions while lying down.   °· The contractions get weaker and are shorter lasting as time goes on.   °· The contractions do not usually become progressively stronger, regular, and closer together as with true labor.   °True Labor °· Contractions in true labor last 30-70 seconds, become very regular, usually become more intense, and increase in frequency.   °· The contractions do not go away with walking.   °· The discomfort is usually felt in the top of the uterus and spreads to the lower abdomen and low back.   °· True labor can be  determined by your health care provider with an exam. This will show that the cervix is dilating and getting thinner.   °WHAT TO REMEMBER °· Keep up with your usual exercises and follow other instructions given by your health care provider.   °· Take medicines as directed by your health care provider.   °· Keep your regular prenatal appointments.   °· Eat and drink lightly if you think you are going into labor.   °· If Braxton Hicks contractions are making you uncomfortable:   °¨ Change your position from lying down or resting to walking, or from walking to resting.   °¨ Sit and rest in a tub of warm water.   °¨ Drink 2-3 glasses of water. Dehydration may cause these contractions.   °¨ Do slow and deep breathing several times an hour.   °WHEN SHOULD I SEEK IMMEDIATE MEDICAL CARE? °Seek immediate medical care if: °· Your contractions become stronger, more regular, and closer together.   °· You have fluid leaking or gushing from your vagina.   °· You have a fever.   °· You pass blood-tinged mucus.   °· You have vaginal bleeding.   °· You have continuous abdominal pain.   °· You have low back pain that you never had before.   °· You feel your baby's head pushing down and causing pelvic pressure.   °· Your baby is not moving as much as it used to.   °Document Released: 03/29/2005 Document Revised: 04/03/2013 Document Reviewed: 01/08/2013 °ExitCare® Patient Information ©2015 [employer]. This information is not intended to replace advice given to you by your health care   provider. Make sure you discuss any questions you have with your health care provider. ° °

## 2014-10-02 NOTE — MAU Provider Note (Signed)
History   24 yo [id number] at 35 3/7 weeks presented from the office s/p evaluation for contractions--frequent UCs on monitor at office, NST reassuring but non-reactive.  Cervix closed, long, vtx, -1.  GBS done in office today.  Patient denies leaking or bleeding--reports UCs started yesterday, have not stopped.  No recent IC or any other issue.  Denies dysuria, reports +FM.  Had +FFN at 22 weeks, no steroids given--negative FFN on f/u 07/26/14.  Patient Active Problem List   Diagnosis Date Noted  . Abdominal pain in pregnancy 05/22/2014  . H/O rapid labor 05/22/2014  . Enterococcus UTI at [facility] 05/22/2014  . Spotting affecting pregnancy 05/22/2014    Chief Complaint  Patient presents with  . Contractions   HPI:  As above  OB History    Gravida Para Term Preterm AB TAB SAB Ectopic Multiple Living   4 2 2  1  1   2       Past Medical History  Diagnosis Date  . Medical history non-contributory     Past Surgical History  Procedure Laterality Date  . No past surgeries      Family History  Problem Relation Age of Onset  . Diabetes Mother   . Heart disease Mother     History  Substance Use Topics  . Smoking status: Never Smoker   . Smokeless tobacco: Not on file  . Alcohol Use: No    Allergies: No Known Allergies  Prescriptions prior to admission  Medication Sig Dispense Refill Last Dose  . acetaminophen (TYLENOL) 325 MG tablet Take 650 mg by mouth every 6 (six) hours as needed for headache.   08/05/2014 at Unknown time  . oxyCODONE-acetaminophen (ROXICET) 5-325 MG per tablet Take 1-2 tablets by mouth every 6 (six) hours as needed. 30 tablet 0   . Prenatal Vit-Fe Fumarate-FA (PRENATAL MULTIVITAMIN) TABS tablet Take 1 tablet by mouth daily.    08/05/2014 at Unknown time    ROS: Contractions, +FM. Physical Exam   Blood pressure 125/81, pulse 64, temperature 97.8 F (36.6 C), temperature source Oral, resp. rate 16, height 5' (1.524 m), weight 85.73 kg (189 lb), last  menstrual period 01/23/2014.    Physical Exam  In mild distress with UCs Chest clear Heart RRR without murmur Abd gravid, NT Pelvic--cervix poserior, closed, 50%, vtx, -2 Ext WNL  FHR moderate variability, 10 beat accels. UCs q 5 min, mild/moderate  ED Course  Assessment: IUP at 35 3/7 weeks Early vs prodromal labor GBS pending from office  Plan: Consulted with Dr. Charlesetta Garibaldi prior to patient arrival. No tocolytics planned. Will give betamethasone course since < 37 weeks IV hydration Pain med Re-evaluate prn.   Donnel Saxon CNM, MSN 10/02/2014 11:50 AM  Addendum: Received IV Stadol and Phenergan at 1210 and 1214, respectively. 1st dose betamethasone at 1219.  Has rested, but still aware of contractions.   Category 1 FHR UCs q 4-7 min, mild to palpation.  Cervix posterior, closed, 50%, vtx, -2 (no change).  Results for orders placed or performed during the hospital encounter of 10/02/14 (from the past 24 hour(s))  Urinalysis, Routine w reflex microscopic (not at [facility])     Status: Abnormal   Collection Time: 10/02/14 11:05 AM  Result Value Ref Range   Color, Urine YELLOW YELLOW   APPearance CLEAR CLEAR   Specific Gravity, Urine 1.010 1.005 - 1.030   pH 6.0 5.0 - 8.0   Glucose, UA NEGATIVE NEGATIVE mg/dL   Hgb urine dipstick NEGATIVE NEGATIVE  Bilirubin Urine NEGATIVE NEGATIVE   Ketones, ur NEGATIVE NEGATIVE mg/dL   Protein, ur NEGATIVE NEGATIVE mg/dL   Urobilinogen, UA 0.2 0.0 - 1.0 mg/dL   Nitrite NEGATIVE NEGATIVE   Leukocytes, UA MODERATE (A) NEGATIVE  Urine microscopic-add on     Status: Abnormal   Collection Time: 10/02/14 11:05 AM  Result Value Ref Range   Squamous Epithelial / LPF RARE RARE   WBC, UA 7-10 <3 WBC/hpf   Bacteria, UA FEW (A) RARE   Reviewed issue of possible prodromal labor. Patient has Percocet at home, since she has had frequent discomfort throughout pregnancy. D/C'd home with labor precautions, and reviewed s/s of advancing  labor. Will return to MAU tomorrow for 2nd dose betamethasone.  Donnel Saxon, CNM 10/02/14 3P

## 2014-10-02 NOTE — MAU Note (Signed)
Notified V. Cira Servant CNM that patient is on efm in MAU room 4.

## 2014-10-03 ENCOUNTER — Inpatient Hospital Stay (HOSPITAL_COMMUNITY)
Admission: AD | Admit: 2014-10-03 | Discharge: 2014-10-03 | Disposition: A | Discharge status: Home / Self Care | Payer: Medicaid Other | Source: Ambulatory Visit | Attending: Obstetrics and Gynecology | Admitting: Obstetrics and Gynecology

## 2014-10-03 ENCOUNTER — Encounter (HOSPITAL_COMMUNITY): Payer: Self-pay | Admitting: *Deleted

## 2014-10-03 DIAGNOSIS — O26899 Other specified pregnancy related conditions, unspecified trimester: Secondary | ICD-10-CM

## 2014-10-03 DIAGNOSIS — R109 Unspecified abdominal pain: Secondary | ICD-10-CM

## 2014-10-03 DIAGNOSIS — Z3A35 35 weeks gestation of pregnancy: Secondary | ICD-10-CM | POA: Diagnosis not present

## 2014-10-03 MED ORDER — NIFEDIPINE 10 MG PO CAPS
10.0000 mg | ORAL_CAPSULE | ORAL | Status: DC | PRN
  Administered 2014-10-03 (×3): 10 mg via ORAL
  Filled 2014-10-03 (×3): qty 1

## 2014-10-03 MED ORDER — BETAMETHASONE SOD PHOS & ACET 6 (3-3) MG/ML IJ SUSP
12.0000 mg | Freq: Once | INTRAMUSCULAR | Status: AC
Start: 2014-10-03 — End: 2014-10-03
  Administered 2014-10-03: 12 mg via INTRAMUSCULAR
  Filled 2014-10-03: qty 2

## 2014-10-03 NOTE — MAU Note (Signed)
Contractions have not gone away since yesterday, says some of them are stronger than they were. Took tylenol around 0100.  Denies LOF/VB.

## 2014-10-03 NOTE — MAU Provider Note (Signed)
  History   24 yo [id number] at 51 4/7 weeks presented today for 2nd dose betamethasone--seen yesterday at office for regular visit, noted to be contracting. Sent to MAU for further monitoring for labor.  Cervix unchanged (closed, 50%, vtx, -2/-3), but was contracting q 5 min, mild/moderate.  Given IV fluids, Stadol, and Phenergan.  Contractions continued to be more irregular, and there was no cervical change over 2 hours.  Betamethasone course begun due to < 37 weeks, and patient instructed to return today for 2nd dose.  Had Rx for Percocet at home from previous MAU visit--instructed to use as needed.  Patient took single dose of that last night, which helped her sleep.  Reports contractions have continued, some stronger than before.  Patient Active Problem List   Diagnosis Date Noted  . Cramping affecting pregnancy, antepartum 10/03/2014  . Abdominal pain in pregnancy 05/22/2014  . H/O rapid labor 05/22/2014  . Enterococcus UTI at [facility] 05/22/2014  . Spotting affecting pregnancy 05/22/2014    Chief Complaint  Patient presents with  . Contractions   HPI  OB History    Gravida Para Term Preterm AB TAB SAB Ectopic Multiple Living   4 2 2  1  1   2       Past Medical History  Diagnosis Date  . Medical history non-contributory     Past Surgical History  Procedure Laterality Date  . No past surgeries      Family History  Problem Relation Age of Onset  . Diabetes Mother   . Heart disease Mother     History  Substance Use Topics  . Smoking status: Never Smoker   . Smokeless tobacco: Not on file  . Alcohol Use: No    Allergies: No Known Allergies  Prescriptions prior to admission  Medication Sig Dispense Refill Last Dose  . oxyCODONE-acetaminophen (ROXICET) 5-325 MG per tablet Take 1-2 tablets by mouth every 6 (six) hours as needed. (Patient not taking: Reported on 10/02/2014) 30 tablet 0   . Prenatal Vit-Fe Fumarate-FA (PRENATAL MULTIVITAMIN) TABS tablet Take 1 tablet by mouth  daily.    10/02/2014 at Unknown time    ROS:  Contractions, +FM Physical Exam   Blood pressure 116/74, pulse 56, temperature 98.3 F (36.8 C), temperature source Oral, resp. rate 18, last menstrual period 01/23/2014.    Physical Exam  In mild distress with UCs Chest clear Heart RRR without murmur Abd gravid, NT Pelvic cervix posterior, closed/FT, 50%, vtx, -2/-3--no change from yesterday. Ext WNL  FHR Category 1 UCs q 2-5 min, irregular in quality and pattern (same as yesterday), some irritability  ED Course  Assessment: IUP at 35 4/7 weeks Uterine contractions without cervical change GBS pending from office 10/02/14  Plan: Issues reviewed, including gestational age, persistence of UCs without cervical change. Will try Procardia 10 mg q 20 min up to 4 doses regimen as trial for patient's comfort.   Donnel Saxon CNM, MSN 10/03/2014 2:08 PM  Addendum: Received 4 doses of Procardia without any significant change in UCs, still irregular.  D/C home with labor precautions Keep scheduled appt at [place], call/return prn. Continue Percocet as needed.  Donnel Saxon, CNM 10/03/14 3:20p

## 2014-10-03 NOTE — Discharge Instructions (Signed)
Braxton Hicks Contractions °Contractions of the uterus can occur throughout pregnancy. Contractions are not always a sign that you are in labor.  °WHAT ARE BRAXTON HICKS CONTRACTIONS?  °Contractions that occur before labor are called Braxton Hicks contractions, or false labor. Toward the end of pregnancy (32-34 weeks), these contractions can develop more often and may become more forceful. This is not true labor because these contractions do not result in opening (dilatation) and thinning of the cervix. They are sometimes difficult to tell apart from true labor because these contractions can be forceful and people have different pain tolerances. You should not feel embarrassed if you go to the hospital with false labor. Sometimes, the only way to tell if you are in true labor is for your health care provider to look for changes in the cervix. °If there are no prenatal problems or other health problems associated with the pregnancy, it is completely safe to be sent home with false labor and await the onset of true labor. °HOW CAN YOU TELL THE DIFFERENCE BETWEEN TRUE AND FALSE LABOR? °False Labor °· The contractions of false labor are usually shorter and not as hard as those of true labor.   °· The contractions are usually irregular.   °· The contractions are often felt in the front of the lower abdomen and in the groin.   °· The contractions may go away when you walk around or change positions while lying down.   °· The contractions get weaker and are shorter lasting as time goes on.   °· The contractions do not usually become progressively stronger, regular, and closer together as with true labor.   °True Labor °· Contractions in true labor last 30-70 seconds, become very regular, usually become more intense, and increase in frequency.   °· The contractions do not go away with walking.   °· The discomfort is usually felt in the top of the uterus and spreads to the lower abdomen and low back.   °· True labor can be  determined by your health care provider with an exam. This will show that the cervix is dilating and getting thinner.   °WHAT TO REMEMBER °· Keep up with your usual exercises and follow other instructions given by your health care provider.   °· Take medicines as directed by your health care provider.   °· Keep your regular prenatal appointments.   °· Eat and drink lightly if you think you are going into labor.   °· If Braxton Hicks contractions are making you uncomfortable:   °¨ Change your position from lying down or resting to walking, or from walking to resting.   °¨ Sit and rest in a tub of warm water.   °¨ Drink 2-3 glasses of water. Dehydration may cause these contractions.   °¨ Do slow and deep breathing several times an hour.   °WHEN SHOULD I SEEK IMMEDIATE MEDICAL CARE? °Seek immediate medical care if: °· Your contractions become stronger, more regular, and closer together.   °· You have fluid leaking or gushing from your vagina.   °· You have a fever.   °· You pass blood-tinged mucus.   °· You have vaginal bleeding.   °· You have continuous abdominal pain.   °· You have low back pain that you never had before.   °· You feel your baby's head pushing down and causing pelvic pressure.   °· Your baby is not moving as much as it used to.   °Document Released: 03/29/2005 Document Revised: 04/03/2013 Document Reviewed: 01/08/2013 °ExitCare® Patient Information ©2015 [employer]. This information is not intended to replace advice given to you by your health care   provider. Make sure you discuss any questions you have with your health care provider. ° °

## 2014-10-07 ENCOUNTER — Encounter (HOSPITAL_COMMUNITY): Payer: Self-pay | Admitting: *Deleted

## 2014-10-07 ENCOUNTER — Inpatient Hospital Stay (HOSPITAL_COMMUNITY)
Admission: AD | Admit: 2014-10-07 | Discharge: 2014-10-07 | Disposition: A | Discharge status: Home / Self Care | Payer: Medicaid Other | Source: Ambulatory Visit | Attending: Obstetrics & Gynecology | Admitting: Obstetrics & Gynecology

## 2014-10-07 DIAGNOSIS — O98813 Other maternal infectious and parasitic diseases complicating pregnancy, third trimester: Secondary | ICD-10-CM | POA: Insufficient documentation

## 2014-10-07 DIAGNOSIS — Z3A36 36 weeks gestation of pregnancy: Secondary | ICD-10-CM | POA: Diagnosis not present

## 2014-10-07 DIAGNOSIS — B373 Candidiasis of vulva and vagina: Secondary | ICD-10-CM | POA: Insufficient documentation

## 2014-10-07 LAB — URINALYSIS, ROUTINE W REFLEX MICROSCOPIC
Bilirubin Urine: NEGATIVE
GLUCOSE, UA: NEGATIVE mg/dL
Hgb urine dipstick: NEGATIVE
Ketones, ur: NEGATIVE mg/dL
Nitrite: NEGATIVE
Protein, ur: NEGATIVE mg/dL
SPECIFIC GRAVITY, URINE: 1.015 (ref 1.005–1.030)
Urobilinogen, UA: 0.2 mg/dL (ref 0.0–1.0)
pH: 6 (ref 5.0–8.0)

## 2014-10-07 LAB — URINE MICROSCOPIC-ADD ON

## 2014-10-07 NOTE — Progress Notes (Addendum)
S: Still with increased discomfort. Declines IV pain medication. Has pain med at home -- states took only once and that was during the day -- "was dizzy when I got up to the bathroom, so I have not taken anymore."   O: BP 127/77 Cvx fingertip BL FHR 145 w/ moderate variability, +accels, no decels Mild, irregular ctxs Results for orders placed or performed during the hospital encounter of 10/07/14 (from the past 24 hour(s))  Urinalysis, Routine w reflex microscopic (not at [facility])     Status: Abnormal   Collection Time: 10/07/14  5:45 PM  Result Value Ref Range   Color, Urine YELLOW YELLOW   APPearance CLEAR CLEAR   Specific Gravity, Urine 1.015 1.005 - 1.030   pH 6.0 5.0 - 8.0   Glucose, UA NEGATIVE NEGATIVE mg/dL   Hgb urine dipstick NEGATIVE NEGATIVE   Bilirubin Urine NEGATIVE NEGATIVE   Ketones, ur NEGATIVE NEGATIVE mg/dL   Protein, ur NEGATIVE NEGATIVE mg/dL   Urobilinogen, UA 0.2 0.0 - 1.0 mg/dL   Nitrite NEGATIVE NEGATIVE   Leukocytes, UA LARGE (A) NEGATIVE  Urine microscopic-add on     Status: Abnormal   Collection Time: 10/07/14  5:45 PM  Result Value Ref Range   Squamous Epithelial / LPF FEW (A) RARE   WBC, UA 11-20 <3 WBC/hpf   Bacteria, UA MANY (A) RARE   Urine-Other RARE YEAST     A: 24 yo G4P2 @ 36.1 wks Preterm Contractions w/o cervical change Cat 1 FHRT Large leuks likely due to yeast - will treat - noted after pt d/c'd  P: Reviewed side effects of pain med - advised to take at night Will have office call pt for yeast treatment PTL precautions Continue daily fetal movement counts per protocol OB f/u as scheduled  Farrel Gordon, CNM 10/07/14, 08:15 PM

## 2014-10-07 NOTE — MAU Note (Signed)
Pt presents complaining of contractions that are still irregular and every 5-10 minutes. Denies vaginal bleeding or leaking. Reports good fetal movement.

## 2014-10-07 NOTE — MAU Note (Signed)
Pt presents to MAU with complaints of contractions on and off since last Wednesday. Denies any vaginal bleeding or LOF

## 2014-10-07 NOTE — MAU Provider Note (Signed)
[name] is a 24 y.o. G4P2 at 36.1 weeks presented to MAU c/o strong ctx all day.  She deines vb or lof w/+FM.  Her last exam in the office was 0/0/-1 on 10/02/14   History     Patient Active Problem List   Diagnosis Date Noted  . Cramping affecting pregnancy, antepartum 10/03/2014  . Abdominal pain in pregnancy 05/22/2014  . H/O rapid labor 05/22/2014  . Enterococcus UTI at [facility] 05/22/2014  . Spotting affecting pregnancy 05/22/2014    Chief Complaint  Patient presents with  . Labor Eval   HPI  OB History    Gravida Para Term Preterm AB TAB SAB Ectopic Multiple Living   4 2 2  1  1   2       Past Medical History  Diagnosis Date  . Medical history non-contributory     Past Surgical History  Procedure Laterality Date  . No past surgeries      Family History  Problem Relation Age of Onset  . Diabetes Mother   . Heart disease Mother     History  Substance Use Topics  . Smoking status: Never Smoker   . Smokeless tobacco: Not on file  . Alcohol Use: No    Allergies: No Known Allergies  Prescriptions prior to admission  Medication Sig Dispense Refill Last Dose  . acetaminophen (TYLENOL) 325 MG tablet Take 650 mg by mouth every 6 (six) hours as needed.   10/06/2014 at 0100  . Prenatal Vit-Fe Fumarate-FA (PRENATAL MULTIVITAMIN) TABS tablet Take 1 tablet by mouth daily.    10/07/2014 at Unknown time    ROS See HPI above, all other systems are negative  Physical Exam   Blood pressure 141/80, pulse 67, temperature 98 F (36.7 C), temperature source Oral, resp. rate 18, last menstrual period 01/23/2014.  Physical Exam Ext:  WNL ABD: Soft, non tender to palpation, no rebound or guarding SVE: FT/60/-1   ED Course  Assessment: IUP at  36.1weeks Membranes: intact FHR: Category 1 CTX:  3-6 minutes   Plan: Monitor in MAU for 1 hour and recheck.  Pt in agreement with plan   Laurina Fischl, CNM, MSN 10/07/2014. 6:06 PM

## 2014-10-07 NOTE — Discharge Instructions (Signed)
Braxton Hicks Contractions °Contractions of the uterus can occur throughout pregnancy. Contractions are not always a sign that you are in labor.  °WHAT ARE BRAXTON HICKS CONTRACTIONS?  °Contractions that occur before labor are called Braxton Hicks contractions, or false labor. Toward the end of pregnancy (32-34 weeks), these contractions can develop more often and may become more forceful. This is not true labor because these contractions do not result in opening (dilatation) and thinning of the cervix. They are sometimes difficult to tell apart from true labor because these contractions can be forceful and people have different pain tolerances. You should not feel embarrassed if you go to the hospital with false labor. Sometimes, the only way to tell if you are in true labor is for your health care provider to look for changes in the cervix. °If there are no prenatal problems or other health problems associated with the pregnancy, it is completely safe to be sent home with false labor and await the onset of true labor. °HOW CAN YOU TELL THE DIFFERENCE BETWEEN TRUE AND FALSE LABOR? °False Labor °· The contractions of false labor are usually shorter and not as hard as those of true labor.   °· The contractions are usually irregular.   °· The contractions are often felt in the front of the lower abdomen and in the groin.   °· The contractions may go away when you walk around or change positions while lying down.   °· The contractions get weaker and are shorter lasting as time goes on.   °· The contractions do not usually become progressively stronger, regular, and closer together as with true labor.   °True Labor °· Contractions in true labor last 30-70 seconds, become very regular, usually become more intense, and increase in frequency.   °· The contractions do not go away with walking.   °· The discomfort is usually felt in the top of the uterus and spreads to the lower abdomen and low back.   °· True labor can be  determined by your health care provider with an exam. This will show that the cervix is dilating and getting thinner.   °WHAT TO REMEMBER °· Keep up with your usual exercises and follow other instructions given by your health care provider.   °· Take medicines as directed by your health care provider.   °· Keep your regular prenatal appointments.   °· Eat and drink lightly if you think you are going into labor.   °· If Braxton Hicks contractions are making you uncomfortable:   °¨ Change your position from lying down or resting to walking, or from walking to resting.   °¨ Sit and rest in a tub of warm water.   °¨ Drink 2-3 glasses of water. Dehydration may cause these contractions.   °¨ Do slow and deep breathing several times an hour.   °WHEN SHOULD I SEEK IMMEDIATE MEDICAL CARE? °Seek immediate medical care if: °· Your contractions become stronger, more regular, and closer together.   °· You have fluid leaking or gushing from your vagina.   °· You have a fever.   °· You pass blood-tinged mucus.   °· You have vaginal bleeding.   °· You have continuous abdominal pain.   °· You have low back pain that you never had before.   °· You feel your baby's head pushing down and causing pelvic pressure.   °· Your baby is not moving as much as it used to.   °Document Released: 03/29/2005 Document Revised: 04/03/2013 Document Reviewed: 01/08/2013 °ExitCare® Patient Information ©2015 [employer]. This information is not intended to replace advice given to you by your health care   provider. Make sure you discuss any questions you have with your health care provider. ° °

## 2014-10-07 NOTE — MAU Note (Signed)
Urine in lab 

## 2014-10-11 ENCOUNTER — Inpatient Hospital Stay (HOSPITAL_COMMUNITY)
Admission: AD | Admit: 2014-10-11 | Discharge: 2014-10-13 | DRG: 775 | Disposition: A | Discharge status: Home / Self Care | Payer: Medicaid Other | Source: Ambulatory Visit | Attending: Obstetrics & Gynecology | Admitting: Obstetrics & Gynecology

## 2014-10-11 ENCOUNTER — Inpatient Hospital Stay (HOSPITAL_COMMUNITY): Payer: Medicaid Other | Admitting: Anesthesiology

## 2014-10-11 ENCOUNTER — Encounter (HOSPITAL_COMMUNITY): Payer: Self-pay | Admitting: *Deleted

## 2014-10-11 DIAGNOSIS — O9081 Anemia of the puerperium: Secondary | ICD-10-CM | POA: Diagnosis not present

## 2014-10-11 DIAGNOSIS — D649 Anemia, unspecified: Secondary | ICD-10-CM | POA: Diagnosis not present

## 2014-10-11 DIAGNOSIS — Z833 Family history of diabetes mellitus: Secondary | ICD-10-CM | POA: Diagnosis not present

## 2014-10-11 DIAGNOSIS — Z3A36 36 weeks gestation of pregnancy: Secondary | ICD-10-CM | POA: Diagnosis present

## 2014-10-11 DIAGNOSIS — Z8249 Family history of ischemic heart disease and other diseases of the circulatory system: Secondary | ICD-10-CM | POA: Diagnosis not present

## 2014-10-11 LAB — CBC
HCT: 37.2 % (ref 36.0–46.0)
Hemoglobin: 12.7 g/dL (ref 12.0–15.0)
MCH: 30.6 pg (ref 26.0–34.0)
MCHC: 34.1 g/dL (ref 30.0–36.0)
MCV: 89.6 fL (ref 78.0–100.0)
Platelets: 191 10*3/uL (ref 150–400)
RBC: 4.15 MIL/uL (ref 3.87–5.11)
RDW: 13.5 % (ref 11.5–15.5)
WBC: 11.2 10*3/uL — AB (ref 4.0–10.5)

## 2014-10-11 LAB — TYPE AND SCREEN
ABO/RH(D): A POS
Antibody Screen: NEGATIVE

## 2014-10-11 MED ORDER — FENTANYL 2.5 MCG/ML BUPIVACAINE 1/10 % EPIDURAL INFUSION (WH - ANES)
14.0000 mL/h | INTRAMUSCULAR | Status: DC | PRN
  Administered 2014-10-11: 14 mL/h via EPIDURAL
  Filled 2014-10-11: qty 125

## 2014-10-11 MED ORDER — DIPHENHYDRAMINE HCL 25 MG PO CAPS: 25.0000 mg | ORAL_CAPSULE | Freq: Four times a day (QID) | ORAL | Status: DC | PRN

## 2014-10-11 MED ORDER — WITCH HAZEL-GLYCERIN EX PADS: 1.0000 "application " | MEDICATED_PAD | CUTANEOUS | Status: DC | PRN

## 2014-10-11 MED ORDER — ONDANSETRON HCL 4 MG/2ML IJ SOLN
4.0000 mg | Freq: Four times a day (QID) | INTRAMUSCULAR | Status: DC | PRN
  Administered 2014-10-11: 4 mg via INTRAVENOUS
  Filled 2014-10-11: qty 2

## 2014-10-11 MED ORDER — DIBUCAINE 1 % RE OINT: 1.0000 "application " | TOPICAL_OINTMENT | RECTAL | Status: DC | PRN

## 2014-10-11 MED ORDER — TERBUTALINE SULFATE 1 MG/ML IJ SOLN
0.2500 mg | Freq: Once | INTRAMUSCULAR | Status: DC | PRN
  Filled 2014-10-11: qty 1

## 2014-10-11 MED ORDER — IBUPROFEN 600 MG PO TABS
600.0000 mg | ORAL_TABLET | Freq: Four times a day (QID) | ORAL | Status: DC
  Administered 2014-10-11 – 2014-10-13 (×8): 600 mg via ORAL
  Filled 2014-10-11 (×8): qty 1

## 2014-10-11 MED ORDER — CITRIC ACID-SODIUM CITRATE 334-500 MG/5ML PO SOLN: 30.0000 mL | ORAL | Status: DC | PRN

## 2014-10-11 MED ORDER — OXYCODONE-ACETAMINOPHEN 5-325 MG PO TABS: 2.0000 | ORAL_TABLET | ORAL | Status: DC | PRN

## 2014-10-11 MED ORDER — ACETAMINOPHEN 325 MG PO TABS: 650.0000 mg | ORAL_TABLET | ORAL | Status: DC | PRN

## 2014-10-11 MED ORDER — LIDOCAINE HCL (PF) 1 % IJ SOLN
30.0000 mL | INTRAMUSCULAR | Status: DC | PRN
  Filled 2014-10-11: qty 30

## 2014-10-11 MED ORDER — FENTANYL CITRATE (PF) 100 MCG/2ML IJ SOLN: 100.0000 ug | INTRAMUSCULAR | Status: DC | PRN

## 2014-10-11 MED ORDER — TETANUS-DIPHTH-ACELL PERTUSSIS 5-2.5-18.5 LF-MCG/0.5 IM SUSP: 0.5000 mL | Freq: Once | INTRAMUSCULAR | Status: DC

## 2014-10-11 MED ORDER — ONDANSETRON HCL 4 MG PO TABS: 4.0000 mg | ORAL_TABLET | ORAL | Status: DC | PRN

## 2014-10-11 MED ORDER — LACTATED RINGERS IV SOLN: INTRAVENOUS | Status: DC

## 2014-10-11 MED ORDER — OXYCODONE-ACETAMINOPHEN 5-325 MG PO TABS
1.0000 | ORAL_TABLET | ORAL | Status: DC | PRN
  Administered 2014-10-12 – 2014-10-13 (×6): 1 via ORAL
  Filled 2014-10-11 (×6): qty 1

## 2014-10-11 MED ORDER — PRENATAL MULTIVITAMIN CH
1.0000 | ORAL_TABLET | Freq: Every day | ORAL | Status: DC
  Administered 2014-10-12 – 2014-10-13 (×2): 1 via ORAL
  Filled 2014-10-11 (×2): qty 1

## 2014-10-11 MED ORDER — SENNOSIDES-DOCUSATE SODIUM 8.6-50 MG PO TABS
2.0000 | ORAL_TABLET | ORAL | Status: DC
  Administered 2014-10-12 – 2014-10-13 (×2): 2 via ORAL
  Filled 2014-10-11 (×2): qty 2

## 2014-10-11 MED ORDER — FLEET ENEMA 7-19 GM/118ML RE ENEM: 1.0000 | ENEMA | RECTAL | Status: DC | PRN

## 2014-10-11 MED ORDER — LIDOCAINE HCL (PF) 1 % IJ SOLN
INTRAMUSCULAR | Status: DC | PRN
  Administered 2014-10-11 (×2): 4 mL

## 2014-10-11 MED ORDER — OXYTOCIN 40 UNITS IN LACTATED RINGERS INFUSION - SIMPLE MED
1.0000 m[IU]/min | INTRAVENOUS | Status: DC
  Administered 2014-10-11: 1 m[IU]/min via INTRAVENOUS

## 2014-10-11 MED ORDER — DIPHENHYDRAMINE HCL 50 MG/ML IJ SOLN: 12.5000 mg | INTRAMUSCULAR | Status: DC | PRN

## 2014-10-11 MED ORDER — OXYTOCIN 40 UNITS IN LACTATED RINGERS INFUSION - SIMPLE MED
62.5000 mL/h | INTRAVENOUS | Status: DC
  Filled 2014-10-11: qty 1000

## 2014-10-11 MED ORDER — ZOLPIDEM TARTRATE 5 MG PO TABS: 5.0000 mg | ORAL_TABLET | Freq: Every evening | ORAL | Status: DC | PRN

## 2014-10-11 MED ORDER — LACTATED RINGERS IV SOLN
500.0000 mL | INTRAVENOUS | Status: DC | PRN
  Administered 2014-10-11: 1000 mL via INTRAVENOUS

## 2014-10-11 MED ORDER — OXYCODONE-ACETAMINOPHEN 5-325 MG PO TABS: 1.0000 | ORAL_TABLET | ORAL | Status: DC | PRN

## 2014-10-11 MED ORDER — LANOLIN HYDROUS EX OINT: TOPICAL_OINTMENT | CUTANEOUS | Status: DC | PRN

## 2014-10-11 MED ORDER — SIMETHICONE 80 MG PO CHEW: 80.0000 mg | CHEWABLE_TABLET | ORAL | Status: DC | PRN

## 2014-10-11 MED ORDER — ONDANSETRON HCL 4 MG/2ML IJ SOLN: 4.0000 mg | INTRAMUSCULAR | Status: DC | PRN

## 2014-10-11 MED ORDER — PHENYLEPHRINE 40 MCG/ML (10ML) SYRINGE FOR IV PUSH (FOR BLOOD PRESSURE SUPPORT)
80.0000 ug | PREFILLED_SYRINGE | INTRAVENOUS | Status: DC | PRN
  Filled 2014-10-11: qty 2
  Filled 2014-10-11: qty 20

## 2014-10-11 MED ORDER — OXYTOCIN BOLUS FROM INFUSION
500.0000 mL | INTRAVENOUS | Status: DC
  Administered 2014-10-11: 500 mL via INTRAVENOUS

## 2014-10-11 MED ORDER — EPHEDRINE 5 MG/ML INJ
10.0000 mg | INTRAVENOUS | Status: DC | PRN
  Filled 2014-10-11: qty 2

## 2014-10-11 MED ORDER — BENZOCAINE-MENTHOL 20-0.5 % EX AERO
1.0000 "application " | INHALATION_SPRAY | CUTANEOUS | Status: DC | PRN
  Administered 2014-10-11: 1 via TOPICAL
  Filled 2014-10-11: qty 56

## 2014-10-11 NOTE — Anesthesia Procedure Notes (Signed)
Epidural Patient location during procedure: OB  Staffing Anesthesiologist: Brit Wernette Performed by: anesthesiologist   Preanesthetic Checklist Completed: patient identified, site marked, surgical consent, pre-op evaluation, timeout performed, IV checked, risks and benefits discussed and monitors and equipment checked  Epidural Patient position: sitting Prep: site prepped and draped and DuraPrep Patient monitoring: continuous pulse ox and blood pressure Approach: midline Location: L3-L4 Injection technique: LOR saline  Needle:  Needle type: Tuohy  Needle gauge: 17 G Needle length: 9 cm and 9 Needle insertion depth: 7 cm Catheter type: closed end flexible Catheter size: 19 Gauge Catheter at skin depth: 11 cm Test dose: negative  Assessment Events: blood not aspirated, injection not painful, no injection resistance, negative IV test and no paresthesia  Additional Notes Patient identified. Risks/Benefits/Options discussed with patient including but not limited to bleeding, infection, nerve damage, paralysis, failed block, incomplete pain control, headache, blood pressure changes, nausea, vomiting, reactions to medication both or allergic, itching and postpartum back pain. Confirmed with bedside nurse the patient's most recent platelet count. Confirmed with patient that they are not currently taking any anticoagulation, have any bleeding history or any family history of bleeding disorders. Patient expressed understanding and wished to proceed. All questions were answered. Sterile technique was used throughout the entire procedure. Please see nursing notes for vital signs. Test dose was given through epidural catheter and negative prior to continuing to dose epidural or start infusion. Warning signs of high block given to the patient including shortness of breath, tingling/numbness in hands, complete motor block, or any concerning symptoms with instructions to call for help. Patient was  given instructions on fall risk and not to get out of bed. All questions and concerns addressed with instructions to call with any issues or inadequate analgesia.

## 2014-10-11 NOTE — Progress Notes (Signed)
Called into patients room. Pt stated her IV was leaking, reconnected Pitocin to primary IV line. Then reported that she felt a "pop" in her vaginal area. When went to inspect patient, noted baby's head had SVD. Called for CNM and staff assist. No [place] noted, pt SVD infant onto bed. Dried and stimulated baby, spont cry. [name]

## 2014-10-11 NOTE — MAU Note (Signed)
Contractions every 5 minutes. Denies bright red vaginal bleeding.  Denies bloody show.  Positive fetal movement Denies SROM/LOF Currently being treated for yeast infection, denies any other complications of pregnancy   GBS negative per patient  Not planning for epidural but open to it in event that she needs it. No IV pain meds.

## 2014-10-11 NOTE — Anesthesia Preprocedure Evaluation (Signed)
Anesthesia Evaluation  Patient identified by MRN, date of birth, ID band Patient awake    Reviewed: Allergy & Precautions, NPO status , Patient's Chart, lab work & pertinent test results  History of Anesthesia Complications Negative for: history of anesthetic complications  Airway Mallampati: II  TM Distance: >3 FB Neck ROM: Full    Dental no notable dental hx. (+) Dental Advisory Given   Pulmonary neg pulmonary ROS,  breath sounds clear to auscultation  Pulmonary exam normal       Cardiovascular negative cardio ROS Normal cardiovascular examRhythm:Regular Rate:Normal     Neuro/Psych negative neurological ROS  negative psych ROS   GI/Hepatic negative GI ROS, Neg liver ROS,   Endo/Other  negative endocrine ROSobesity  Renal/GU negative Renal ROS  negative genitourinary   Musculoskeletal negative musculoskeletal ROS (+)   Abdominal   Peds negative pediatric ROS (+)  Hematology negative hematology ROS (+)   Anesthesia Other Findings   Reproductive/Obstetrics (+) Pregnancy                             Anesthesia Physical Anesthesia Plan  ASA: II  Anesthesia Plan: Epidural   Post-op Pain Management:    Induction:   Airway Management Planned:   Additional Equipment:   Intra-op Plan:   Post-operative Plan:   Informed Consent: I have reviewed the patients History and Physical, chart, labs and discussed the procedure including the risks, benefits and alternatives for the proposed anesthesia with the patient or authorized representative who has indicated his/her understanding and acceptance.   Dental advisory given  Plan Discussed with: CRNA  Anesthesia Plan Comments:         Anesthesia Quick Evaluation

## 2014-10-11 NOTE — Progress Notes (Signed)
  Subjective: Comfortable with epidural.  Objective: BP 120/72 mmHg  Pulse 52  Temp(Src) 97.2 F (36.2 C) (Oral)  Resp 20  Ht 5' (1.524 m)  Wt 85.095 kg (187 lb 9.6 oz)  BMI 36.64 kg/m2  SpO2 98%  LMP 01/23/2014      FHT: Category 1 UC:   regular, every 2-4 minutes SVE:   Dilation: 6 Effacement (%): 100 Station: -1 Exam by:: [email or web address],cnm  BBOW--AROM, clear fluid   Assessment:  Active labor GBS negative  Plan: Continue current plan. Re-evaluate prn for need for augmentation.  Donnel Saxon CNM 10/11/2014, 2:03 PM

## 2014-10-11 NOTE — H&P (Signed)
[name] is a 24 y.o. female, [id number] at 51 5/7 weeks, presenting for UCs of increasing intensity and frequency this am.  Has been seen several times in MAU over last 1-2 weeks, with regular contractions, but no cervical change from closed/FT.  Received betamethasone course 6/22--6/23 for persistent UCs without cervical change.    Patient Active Problem List   Diagnosis Date Noted  . Normal labor 10/11/2014  . Cramping affecting pregnancy, antepartum 10/03/2014  . Abdominal pain in pregnancy 05/22/2014  . H/O rapid labor 05/22/2014  . Enterococcus UTI at [facility] 05/22/2014  . Spotting affecting pregnancy 05/22/2014    History of present pregnancy: Patient entered care at 12 4/7 weeks.   EDC of 11/03/14 was established by [place] at 6 5/7 weeks.   Anatomy scan:  20 5/7 weeks, with normall findings and an anterior placenta.   Additional [place] evaluations: 26 5/7 weeks:  Limited OB, normal fluid and cervical length  Significant prenatal events:  Had +FFN at 22-23 weeks, with repeat FFN after 24 weeks that was negative.  Patient had multiple MAU visits for a variety of issues, with several over the last 2 weeks for persistent contractions.   Last evaluation:  10/10/14--BP 110/76.  OB History    Gravida Para Term Preterm AB TAB SAB Ectopic Multiple Living   4 2 2  1  1   2     2005--SVB, 39 weeks, 2 hour labor, 7+8, female, epidural, delivered at [facility] 2008--SVB, 2 hour labor, female, female, epidural, delivered at [facility]  Past Medical History  Diagnosis Date  . Medical history non-contributory    Past Surgical History  Procedure Laterality Date  . No past surgeries     Family History: family history includes Diabetes in her mother; Heart disease in her mother.   Social History:  reports that she has never smoked. She has never used smokeless tobacco. She reports that she does not drink alcohol or use illicit drugs.  Patient is Hispanic, single, high-school educated, FOB is Donzetta Sprung.     Prenatal Transfer Tool  Maternal Diabetes: No Genetic Screening: Normal 1st trimester screen and AFP Maternal Ultrasounds/Referrals: Normal Fetal Ultrasounds or other Referrals:  None Maternal Substance Abuse:  No Significant Maternal Medications:  None Significant Maternal Lab Results: Lab values include: Group B Strep negative  TDAP 08/28/14 Flu 04/25/14  ROS:  Contractions, +FM, mucusy d/c.  No Known Allergies   Dilation: 3 Effacement (%): 100 Station: -2 Exam by:: Maeola Sarah, CNM  Blood pressure 131/85, pulse 62, temperature 98 F (36.7 C), temperature source Oral, resp. rate 18, weight 85.095 kg (187 lb 9.6 oz), last menstrual period 01/23/2014.  In significant pain with UCs. Chest clear Heart RRR without murmur Abd gravid, NT, FH 37 Pelvic: initially 1 cm, 90%, in MAU.  Recheck now as above, with BBOW. Ext: WNL  FHR: Category 2--no decels, but no accels. UCs:  q 2-4 min, moderate  Prenatal labs: ABO, Rh:  A+ Antibody:  neg Rubella:   Immune RPR:   NR HBsAg:  Neg  HIV:   NR GBS:  Negative 10/02/14 Sickle cell/Hgb electrophoresis:  AA Pap:  NA GC:  Negative 07/01/14 Chlamydia:  Negative 07/01/14 Genetic screenings:  Normal Glucola:  Elevated 1 hour, single abnormal value on 3 hour Other:   Hgb 13.3 at NOB, 13.1 at 28 weeks       Assessment/Plan: IUP at 36 5/7 weeks Early labor Hx Betamethasone 6/22-6/23 GBS negative  Plan: Admit to  Birthing Suite per consult with Dr. Alesia Richards Routine [facility] orders Pain med/epidural prn Pitocin/AROM as appropriate   July Linam, [place], [place] 10/11/2014, 10:58 AM

## 2014-10-12 LAB — CBC
HCT: 35.2 % — ABNORMAL LOW (ref 36.0–46.0)
Hemoglobin: 11.9 g/dL — ABNORMAL LOW (ref 12.0–15.0)
MCH: 30.8 pg (ref 26.0–34.0)
MCHC: 33.8 g/dL (ref 30.0–36.0)
MCV: 91.2 fL (ref 78.0–100.0)
PLATELETS: 158 10*3/uL (ref 150–400)
RBC: 3.86 MIL/uL — AB (ref 3.87–5.11)
RDW: 13.7 % (ref 11.5–15.5)
WBC: 12.7 10*3/uL — ABNORMAL HIGH (ref 4.0–10.5)

## 2014-10-12 LAB — HIV ANTIBODY (ROUTINE TESTING W REFLEX): HIV SCREEN 4TH GENERATION: NONREACTIVE

## 2014-10-12 LAB — RPR: RPR: NONREACTIVE

## 2014-10-12 NOTE — Anesthesia Postprocedure Evaluation (Signed)
  Anesthesia Post-op Note  Patient: [name]  Procedure(s) Performed: * No procedures listed *  Patient Location: Mother/Baby  Anesthesia Type:Epidural  Level of Consciousness: awake  Airway and Oxygen Therapy: Patient Spontanous Breathing  Post-op Pain: mild  Post-op Assessment: Patient's Cardiovascular Status Stable and Respiratory Function Stable              Post-op Vital Signs: stable  Last Vitals:  Filed Vitals:   10/12/14 0529  BP: 129/74  Pulse: 60  Temp: 36.9 C  Resp: 20    Complications: No apparent anesthesia complications

## 2014-10-12 NOTE — Progress Notes (Addendum)
Subjective: Postpartum Day 1: Vaginal delivery, left periurethral laceration Patient up ad lib, reports no syncope or dizziness. Feeding:  Breast Contraceptive plan:  Condoms  No circumcision.  Objective: Vital signs in last 24 hours: Temp:  [97 F (36.1 C)-98.6 F (37 C)] 98.4 F (36.9 C) (07/02 0529) Pulse Rate:  [51-75] 60 (07/02 0529) Resp:  [18-20] 20 (07/02 0529) BP: (105-148)/(60-96) 129/74 mmHg (07/02 0529) SpO2:  [98 %-100 %] 98 % (07/01 1246) Weight:  [85.095 kg (187 lb 9.6 oz)] 85.095 kg (187 lb 9.6 oz) (07/01 1218)  Physical Exam:  General: alert Lochia: appropriate Uterine Fundus: firm Perineum: healing well DVT Evaluation: No evidence of DVT seen on physical exam. Negative Homan's sign.  CBC Latest Ref Rng 10/12/2014 10/11/2014 03/30/2014  WBC 4.0 - 10.5 K/uL 12.7(H) 11.2(H) 9.2  Hemoglobin 12.0 - 15.0 g/dL 11.9(L) 12.7 12.6  Hematocrit 36.0 - 46.0 % 35.2(L) 37.2 35.7(L)  Platelets 150 - 400 K/uL 158 191 190     Assessment/Plan: Status post vaginal delivery day 1. Stable Continue current care. Plan for discharge tomorrow    Allena Katz 10/12/2014, 8:08 AM

## 2014-10-12 NOTE — Lactation Note (Signed)
This note was copied from the chart of [place]. Lactation Consultation Note: Experienced BF mom. Reports after pumping with DEBP her left  nipple is very sore- raw on areola. Does not want to put baby to that breast. Baby latched well to right breast- needed some stimulation to continue nursing. Mom easily able to hand express Colostrum. Comfort gels given with instructions for use. Mom requests hand pump- states she does not want to use electric pump any more . Hand pump given and mom pumping when I left room. Encouraged to feed all EBM to baby. BF brochure given with resources for support after DC. No questions at present. To call prn  Patient Name: [name] ESLPN'P Date: 10/12/2014 Reason for consult: Initial assessment;Late preterm infant   Maternal Data Formula Feeding for Exclusion: No Has patient been taught Hand Expression?: Yes Does the patient have breastfeeding experience prior to this delivery?: Yes  Feeding Feeding Type: Breast Fed Length of feed: 12 min  LATCH Score/Interventions Latch: Grasps breast easily, tongue down, lips flanged, rhythmical sucking.  Audible Swallowing: A few with stimulation  Type of Nipple: Everted at rest and after stimulation  Comfort (Breast/Nipple): Filling, red/small blisters or bruises, mild/mod discomfort  Problem noted: Severe discomfort Interventions (Severe discomfort): Observe pumping  Hold (Positioning): Assistance needed to correctly position infant at breast and maintain latch. Intervention(s): Breastfeeding basics reviewed  LATCH Score: 7  Lactation Tools Discussed/Used Tools: Comfort gels   Consult Status Consult Status: Follow-up Date: 10/13/14 Follow-up type: In-patient    Truddie Crumble 10/12/2014, 12:08 PM

## 2014-10-12 NOTE — Progress Notes (Signed)
Pt c/o soreness with breastfeeding. States that it is not nipple soreness but the area around the nipple that is sore. On exam of the areola I noticed that there are abrasions. Pt stated that it became that way after using the double electric breast pump. Informed pt not to feed on that side until lactation could examine the area.

## 2014-10-13 MED ORDER — FERROUS SULFATE 325 (65 FE) MG PO TBEC: 325.0000 mg | DELAYED_RELEASE_TABLET | Freq: Two times a day (BID) | ORAL | Status: DC

## 2014-10-13 MED ORDER — IBUPROFEN 600 MG PO TABS: 600.0000 mg | ORAL_TABLET | Freq: Four times a day (QID) | ORAL | Status: DC

## 2014-10-13 MED ORDER — OXYCODONE-ACETAMINOPHEN 5-325 MG PO TABS: 1.0000 | ORAL_TABLET | ORAL | Status: DC | PRN

## 2014-10-13 MED ORDER — MEASLES, MUMPS & RUBELLA VAC ~~LOC~~ INJ
0.5000 mL | INJECTION | Freq: Once | SUBCUTANEOUS | Status: AC
  Administered 2014-10-13: 0.5 mL via SUBCUTANEOUS
  Filled 2014-10-13: qty 0.5

## 2014-10-13 NOTE — Lactation Note (Signed)
This note was copied from the chart of [place]. Lactation Consultation Note: Mom reports that baby just finished feeding for 25 min. He is asleep in bassinet at present. Reports her breasts are feeling heavier this morning and she can see whitish milk  Left nipple healing- mom reports it still hurts a little bit when he latches but she is still putting him on. Suggested trying football hold at some feedings. No questions at present. To call prn  Patient Name: Boy Briseidy Spark [id number] Date: 10/13/2014 Reason for consult: Follow-up assessment   Maternal Data Formula Feeding for Exclusion: No Has patient been taught Hand Expression?: Yes Does the patient have breastfeeding experience prior to this delivery?: Yes  Feeding Feeding Type: Breast Fed Length of feed: 15 min  LATCH Score/Interventions Latch: Grasps breast easily, tongue down, lips flanged, rhythmical sucking.  Audible Swallowing: Spontaneous and intermittent  Type of Nipple: Everted at rest and after stimulation  Comfort (Breast/Nipple): Filling, red/small blisters or bruises, mild/mod discomfort  Problem noted: Mild/Moderate discomfort Interventions  (Cracked/bleeding/bruising/blister): Expressed breast milk to nipple  Hold (Positioning): No assistance needed to correctly position infant at breast.  LATCH Score: 9  Lactation Tools Discussed/Used WIC Program: Yes   Consult Status Consult Status: Follow-up Date: 10/14/14 Follow-up type: In-patient    Truddie Crumble 10/13/2014, 2:30 PM

## 2014-10-13 NOTE — Discharge Summary (Signed)
Vaginal Delivery Discharge Summary  ALL information will be verified prior to discharge  [name]  DOB:    12-May-1990 MRN:    [id number] CSN:    [id number]  Date of admission:                  10/11/14  Date of discharge:                   10/13/14  Procedures this admission: SVD  Date of Delivery: 10/11/14  Newborn Data:  Live born  Information for the patient's newborn:  MYLEA, ROARTY [[id number]]  female   Live born female  Birth Weight: 6 lb 2.4 oz (2790 g) APGAR: 9, 10  Home with mother. Name: Landen Circumcision Plan: No   History of Present Illness: [name] is a 24 y.o. female, [phone], who presents at [redacted]w[redacted]d weeks gestation. The patient has been followed at the [facility] of [facility]. She was admitted onset of labor. Her pregnancy has been complicated by:  Patient Active Problem List   Diagnosis Date Noted  . Vaginal delivery 10/11/2014  . Cramping affecting pregnancy, antepartum 10/03/2014  . Abdominal pain in pregnancy 05/22/2014  . H/O rapid labor 05/22/2014  . Enterococcus UTI at [facility] 05/22/2014  . Spotting affecting pregnancy 05/22/2014    Hospital course: The patient was admitted for Labor.   Her labor was not complicated. She proceeded to have a vaginal delivery of a healthy infant. Her delivery was not complicated. Her postpartum course was not complicated. She was discharged to home on postpartum day 2 doing well.  Feeding: breast  Contraception: condoms   Discharge hemoglobin: HEMOGLOBIN  Date Value Ref Range Status  10/12/2014 11.9* 12.0 - 15.0 g/dL Final   HCT  Date Value Ref Range Status  10/12/2014 35.2* 36.0 - 46.0 % Final    PreNatal Labs ABO, Rh: --/--/A POS (07/01 1140)   Antibody: NEG (07/01 1140) Rubella:    immune RPR: Non Reactive (07/01 1140)  HBsAg: Negative (01/11 0000)  HIV: Non-reactive (01/11 0000)  GBS: Negative  (06/22 0000)  Discharge Physical Exam:  General: alert and cooperative Lochia: appropriate Uterine Fundus: firm Incision: healing well DVT Evaluation: No evidence of DVT seen on physical exam.  Intrapartum Procedures: spontaneous vaginal delivery Postpartum Procedures: none Complications-Operative and Postpartum: Left peri-urethral  Discharge Diagnoses: Nearterm delivery,  asymptomatic anemia  Activity:           pelvic rest Diet:                routine Medications: PNV, Ibuprofen, Iron and Percocet Condition:      stable     Postpartum Teaching: Nutrition, exercise, return to work or school, family visits, sexual activity, home rest, vaginal bleeding, pelvic rest, family planning, s/s of PPD, breast care peri-care and incision care   Discharge to: home  Follow-up Information    Follow up with [facility] In 6 weeks.   Specialty:  Obstetrics and Gynecology   Why:  Postpartum check up   Contact information:   [address] [place] [address] [phone]       Tomie Elko, CNM, MSN 10/13/2014. 8:29 AM  All information will be verified prior to discharge   Postpartum Care After Vaginal Delivery  After you deliver your newborn (postpartum period), the usual stay in the hospital is 24 72 hours. If there were problems with  your labor or delivery, or if you have other medical problems, you might be in the hospital longer.  While you are in the hospital, you will receive help and instructions on how to care for yourself and your newborn during the postpartum period.  While you are in the hospital:  Be sure to tell your nurses if you have pain or discomfort, as well as where you feel the pain and what makes the pain worse.  If you had an incision made near your vagina (episiotomy) or if you had some tearing during delivery, the nurses may put ice packs on your episiotomy or tear. The ice packs may help to  reduce the pain and swelling.  If you are breastfeeding, you may feel uncomfortable contractions of your uterus for a couple of weeks. This is normal. The contractions help your uterus get back to normal size.  It is normal to have some bleeding after delivery.  For the first 1 3 days after delivery, the flow is red and the amount may be similar to a period.  It is common for the flow to start and stop.  In the first few days, you may pass some small clots. Let your nurses know if you begin to pass large clots or your flow increases.  Do not  flush blood clots down the toilet before having the nurse look at them.  During the next 3 10 days after delivery, your flow should become more watery and pink or brown-tinged in color.  Ten to fourteen days after delivery, your flow should be a small amount of yellowish-white discharge.  The amount of your flow will decrease over the first few weeks after delivery. Your flow may stop in 6 8 weeks. Most women have had their flow stop by 12 weeks after delivery.  You should change your sanitary pads frequently.  Wash your hands thoroughly with soap and water for at least 20 seconds after changing pads, using the toilet, or before holding or feeding your newborn.  You should feel like you need to empty your bladder within the first 6 8 hours after delivery.  In case you become weak, lightheaded, or faint, call your nurse before you get out of bed for the first time and before you take a shower for the first time.  Within the first few days after delivery, your breasts may begin to feel tender and full. This is called engorgement. Breast tenderness usually goes away within 48 72 hours after engorgement occurs. You may also notice milk leaking from your breasts. If you are not breastfeeding, do not stimulate your breasts. Breast stimulation can make your breasts produce more milk.  Spending as much time as possible with your newborn is very important.  During this time, you and your newborn can feel close and get to know each other. Having your newborn stay in your room (rooming in) will help to strengthen the bond with your newborn. It will give you time to get to know your newborn and become comfortable caring for your newborn.  Your hormones change after delivery. Sometimes the hormone changes can temporarily cause you to feel sad or tearful. These feelings should not last more than a few days. If these feelings last longer than that, you should talk to your caregiver.  If desired, talk to your caregiver about methods of family planning or contraception.  Talk to your caregiver about immunizations. Your caregiver may want you to have the following immunizations before leaving the  hospital:  Tetanus, diphtheria, and pertussis (Tdap) or tetanus and diphtheria (Td) immunization. It is very important that you and your family (including grandparents) or others caring for your newborn are up-to-date with the Tdap or Td immunizations. The Tdap or Td immunization can help protect your newborn from getting ill.  Rubella immunization.  Varicella (chickenpox) immunization.  Influenza immunization. You should receive this annual immunization if you did not receive the immunization during your pregnancy. Document Released: 01/24/2007 Document Revised: 12/22/2011 Document Reviewed: 11/24/2011 [facility] Patient Information 2014 [place].   Postpartum Depression and Baby Blues  The postpartum period begins right after the birth of a baby. During this time, there is often a great amount of joy and excitement. It is also a time of considerable changes in the life of the parent(s). Regardless of how many times a mother gives birth, each child brings new challenges and dynamics to the family. It is not unusual to have feelings of excitement accompanied by confusing shifts in moods, emotions, and thoughts. All mothers are at risk of developing postpartum  depression or the "baby blues." These mood changes can occur right after giving birth, or they may occur many months after giving birth. The baby blues or postpartum depression can be mild or severe. Additionally, postpartum depression can resolve rather quickly, or it can be a long-term condition. CAUSES Elevated hormones and their rapid decline are thought to be a main cause of postpartum depression and the baby blues. There are a number of hormones that radically change during and after pregnancy. Estrogen and progesterone usually decrease immediately after delivering your baby. The level of thyroid hormone and various cortisol steroids also rapidly drop. Other factors that play a major role in these changes include major life events and genetics.  RISK FACTORS If you have any of the following risks for the baby blues or postpartum depression, know what symptoms to watch out for during the postpartum period. Risk factors that may increase the likelihood of getting the baby blues or postpartum depression include: 1. Havinga personal or family history of depression. 2. Having depression while being pregnant. 3. Having premenstrual or oral contraceptive-associated mood issues. 4. Having exceptional life stress. 5. Having marital conflict. 6. Lacking a social support network. 7. Having a baby with special needs. 8. Having health problems such as diabetes. SYMPTOMS Baby blues symptoms include:  Brief fluctuations in mood, such as going from extreme happiness to sadness.  Decreased concentration.  Difficulty sleeping.  Crying spells, tearfulness.  Irritability.  Anxiety. Postpartum depression symptoms typically begin within the first month after giving birth. These symptoms include:  Difficulty sleeping or excessive sleepiness.  Marked weight loss.  Agitation.  Feelings of worthlessness.  Lack of interest in activity or food. Postpartum psychosis is a very concerning condition and  can be dangerous. Fortunately, it is rare. Displaying any of the following symptoms is cause for immediate medical attention. Postpartum psychosis symptoms include:  Hallucinations and delusions.  Bizarre or disorganized behavior.  Confusion or disorientation. DIAGNOSIS  A diagnosis is made by an evaluation of your symptoms. There are no medical or lab tests that lead to a diagnosis, but there are various questionnaires that a caregiver may use to identify those with the baby blues, postpartum depression, or psychosis. Often times, a screening tool called the [place] Postnatal Depression Scale is used to diagnose depression in the postpartum period.  TREATMENT The baby blues usually goes away on its own in 1 to 2 weeks. Social support is  often all that is needed. You should be encouraged to get adequate sleep and rest. Occasionally, you may be given medicines to help you sleep.  Postpartum depression requires treatment as it can last several months or longer if it is not treated. Treatment may include individual or group therapy, medicine, or both to address any social, physiological, and psychological factors that may play a role in the depression. Regular exercise, a healthy diet, rest, and social support may also be strongly recommended.  Postpartum psychosis is more serious and needs treatment right away. Hospitalization is often needed. HOME CARE INSTRUCTIONS  Get as much rest as you can. Nap when the baby sleeps.  Exercise regularly. Some women find yoga and walking to be beneficial.  Eat a balanced and nourishing diet.  Do little things that you enjoy. Have a cup of tea, take a bubble bath, read your favorite magazine, or listen to your favorite music.  Avoid alcohol.  Ask for help with household chores, cooking, grocery shopping, or running errands as needed. Do not try to do everything.  Talk to people close to you about how you are feeling. Get support from your partner, family  members, friends, or other new moms.  Try to stay positive in how you think. Think about the things you are grateful for.  Do not spend a lot of time alone.  Only take medicine as directed by your caregiver.  Keep all your postpartum appointments.  Let your caregiver know if you have any concerns. SEEK MEDICAL CARE IF: You are having a reaction or problems with your medicine. SEEK IMMEDIATE MEDICAL CARE IF:  You have suicidal feelings.  You feel you may harm the baby or someone else. Document Released: 01/01/2004 Document Revised: 06/21/2011 Document Reviewed: 02/02/2011 [facility] Patient Information 2014 [place], [place].     Breastfeeding Deciding to breastfeed is one of the best choices you can make for you and your baby. A change in hormones during pregnancy causes your breast tissue to grow and increases the number and size of your milk ducts. These hormones also allow proteins, sugars, and fats from your blood supply to make breast milk in your milk-producing glands. Hormones prevent breast milk from being released before your baby is born as well as prompt milk flow after birth. Once breastfeeding has begun, thoughts of your baby, as well as his or her sucking or crying, can stimulate the release of milk from your milk-producing glands.  BENEFITS OF BREASTFEEDING For Your Baby  Your first milk (colostrum) helps your baby's digestive system function better.   There are antibodies in your milk that help your baby fight off infections.   Your baby has a lower incidence of asthma, allergies, and sudden infant death syndrome.   The nutrients in breast milk are better for your baby than infant formulas and are designed uniquely for your baby's needs.   Breast milk improves your baby's brain development.   Your baby is less likely to develop other conditions, such as childhood obesity, asthma, or type 2 diabetes mellitus.  For You   Breastfeeding helps to create a very  special bond between you and your baby.   Breastfeeding is convenient. Breast milk is always available at the correct temperature and costs nothing.   Breastfeeding helps to burn calories and helps you lose the weight gained during pregnancy.   Breastfeeding makes your uterus contract to its prepregnancy size faster and slows bleeding (lochia) after you give birth.   Breastfeeding helps to  lower your risk of developing type 2 diabetes mellitus, osteoporosis, and breast or ovarian cancer later in life. SIGNS THAT YOUR BABY IS HUNGRY Early Signs of Hunger  Increased alertness or activity.  Stretching.  Movement of the head from side to side.  Movement of the head and opening of the mouth when the corner of the mouth or cheek is stroked (rooting).  Increased sucking sounds, smacking lips, cooing, sighing, or squeaking.  Hand-to-mouth movements.  Increased sucking of fingers or hands. Late Signs of Hunger  Fussing.  Intermittent crying. Extreme Signs of Hunger Signs of extreme hunger will require calming and consoling before your baby will be able to breastfeed successfully. Do not wait for the following signs of extreme hunger to occur before you initiate breastfeeding:   Restlessness.  A loud, strong cry.   Screaming.   BREASTFEEDING BASICS Breastfeeding Initiation  Find a comfortable place to sit or lie down, with your neck and back well supported.  Place a pillow or rolled up blanket under your baby to bring him or her to the level of your breast (if you are seated). Nursing pillows are specially designed to help support your arms and your baby while you breastfeed.  Make sure that your baby's abdomen is facing your abdomen.   Gently massage your breast. With your fingertips, massage from your chest wall toward your nipple in a circular motion. This encourages milk flow. You may need to continue this action during the feeding if your milk flows  slowly.  Support your breast with 4 fingers underneath and your thumb above your nipple. Make sure your fingers are well away from your nipple and your baby's mouth.   Stroke your baby's lips gently with your finger or nipple.   When your baby's mouth is open wide enough, quickly bring your baby to your breast, placing your entire nipple and as much of the colored area around your nipple (areola) as possible into your baby's mouth.   More areola should be visible above your baby's upper lip than below the lower lip.   Your baby's tongue should be between his or her lower gum and your breast.   Ensure that your baby's mouth is correctly positioned around your nipple (latched). Your baby's lips should create a seal on your breast and be turned out (everted).  It is common for your baby to suck about 2-3 minutes in order to start the flow of breast milk. Latching Teaching your baby how to latch on to your breast properly is very important. An improper latch can cause nipple pain and decreased milk supply for you and poor weight gain in your baby. Also, if your baby is not latched onto your nipple properly, he or she may swallow some air during feeding. This can make your baby fussy. Burping your baby when you switch breasts during the feeding can help to get rid of the air. However, teaching your baby to latch on properly is still the best way to prevent fussiness from swallowing air while breastfeeding. Signs that your baby has successfully latched on to your nipple:    Silent tugging or silent sucking, without causing you pain.   Swallowing heard between every 3-4 sucks.    Muscle movement above and in front of his or her ears while sucking.  Signs that your baby has not successfully latched on to nipple:   Sucking sounds or smacking sounds from your baby while breastfeeding.  Nipple pain. If you think your baby  has not latched on correctly, slip your finger into the corner of  your baby's mouth to break the suction and place it between your baby's gums. Attempt breastfeeding initiation again. Signs of Successful Breastfeeding Signs from your baby:   A gradual decrease in the number of sucks or complete cessation of sucking.   Falling asleep.   Relaxation of his or her body.   Retention of a small amount of milk in his or her mouth.   Letting go of your breast by himself or herself. Signs from you:  Breasts that have increased in firmness, weight, and size 1-3 hours after feeding.   Breasts that are softer immediately after breastfeeding.  Increased milk volume, as well as a change in milk consistency and color by the fifth day of breastfeeding.   Nipples that are not sore, cracked, or bleeding. Signs That Your [employer]el Books is Getting Enough Milk  Wetting at least 3 diapers in a 24-hour period. The urine should be clear and pale yellow by age 37 days.  At least 3 stools in a 24-hour period by age 37 days. The stool should be soft and yellow.  At least 3 stools in a 24-hour period by age 22 days. The stool should be seedy and yellow.  No loss of weight greater than 10% of birth weight during the first 32 days of age.  Average weight gain of 4-7 ounces (113-198 g) per week after age 62 days.  Consistent daily weight gain by age 87 days, without weight loss after the age of 2 weeks. After a feeding, your baby may spit up a small amount. This is common. BREASTFEEDING FREQUENCY AND DURATION Frequent feeding will help you make more milk and can prevent sore nipples and breast engorgement. Breastfeed when you feel the need to reduce the fullness of your breasts or when your baby shows signs of hunger. This is called "breastfeeding on demand." Avoid introducing a pacifier to your baby while you are working to establish breastfeeding (the first 4-6 weeks after your baby is born). After this time you may choose to use a pacifier. Research has shown that pacifier use  during the first year of a baby's life decreases the risk of sudden infant death syndrome (SIDS). Allow your baby to feed on each breast as long as he or she wants. Breastfeed until your baby is finished feeding. When your baby unlatches or falls asleep while feeding from the first breast, offer the second breast. Because newborns are often sleepy in the first few weeks of life, you may need to awaken your baby to get him or her to feed. Breastfeeding times will vary from baby to baby. However, the following rules can serve as a guide to help you ensure that your baby is properly fed:  Newborns (babies 25 weeks of age or younger) may breastfeed every 1-3 hours.  Newborns should not go longer than 3 hours during the day or 5 hours during the night without breastfeeding.  You should breastfeed your baby a minimum of 8 times in a 24-hour period until you begin to introduce solid foods to your baby at around 38 months of age. BREAST MILK PUMPING Pumping and storing breast milk allows you to ensure that your baby is exclusively fed your breast milk, even at times when you are unable to breastfeed. This is especially important if you are going back to work while you are still breastfeeding or when you are not able to be present during feedings.  Your lactation consultant can give you guidelines on how long it is safe to store breast milk.  A breast pump is a machine that allows you to pump milk from your breast into a sterile bottle. The pumped breast milk can then be stored in a refrigerator or freezer. Some breast pumps are operated by hand, while others use electricity. Ask your lactation consultant which type will work best for you. Breast pumps can be purchased, but some hospitals and breastfeeding support groups lease breast pumps on a monthly basis. A lactation consultant can teach you how to hand express breast milk, if you prefer not to use a pump.  CARING FOR YOUR BREASTS WHILE YOU BREASTFEED Nipples  can become dry, cracked, and sore while breastfeeding. The following recommendations can help keep your breasts moisturized and healthy:  Avoid using soap on your nipples.   Wear a supportive bra. Although not required, special nursing bras and tank tops are designed to allow access to your breasts for breastfeeding without taking off your entire bra or top. Avoid wearing underwire-style bras or extremely tight bras.  Air dry your nipples for 3-74minutes after each feeding.   Use only cotton bra pads to absorb leaked breast milk. Leaking of breast milk between feedings is normal.   Use lanolin on your nipples after breastfeeding. Lanolin helps to maintain your skin's normal moisture barrier. If you use pure lanolin, you do not need to wash it off before feeding your baby again. Pure lanolin is not toxic to your baby. You may also hand express a few drops of breast milk and gently massage that milk into your nipples and allow the milk to air dry. In the first few weeks after giving birth, some women experience extremely full breasts (engorgement). Engorgement can make your breasts feel heavy, warm, and tender to the touch. Engorgement peaks within 3-5 days after you give birth. The following recommendations can help ease engorgement:  Completely empty your breasts while breastfeeding or pumping. You may want to start by applying warm, moist heat (in the shower or with warm water-soaked hand towels) just before feeding or pumping. This increases circulation and helps the milk flow. If your baby does not completely empty your breasts while breastfeeding, pump any extra milk after he or she is finished.  Wear a snug bra (nursing or regular) or tank top for 1-2 days to signal your body to slightly decrease milk production.  Apply ice packs to your breasts, unless this is too uncomfortable for you.  Make sure that your baby is latched on and positioned properly while breastfeeding. If engorgement  persists after 48 hours of following these recommendations, contact your health care provider or a Science writer. OVERALL HEALTH CARE RECOMMENDATIONS WHILE BREASTFEEDING  Eat healthy foods. Alternate between meals and snacks, eating 3 of each per day. Because what you eat affects your breast milk, some of the foods may make your baby more irritable than usual. Avoid eating these foods if you are sure that they are negatively affecting your baby.  Drink milk, fruit juice, and water to satisfy your thirst (about 10 glasses a day).   Rest often, relax, and continue to take your prenatal vitamins to prevent fatigue, stress, and anemia.  Continue breast self-awareness checks.  Avoid chewing and smoking tobacco.  Avoid alcohol and drug use. Some medicines that may be harmful to your baby can pass through breast milk. It is important to ask your health care provider before taking any medicine, including  all over-the-counter and prescription medicine as well as vitamin and herbal supplements. It is possible to become pregnant while breastfeeding. If birth control is desired, ask your health care provider about options that will be safe for your baby. SEEK MEDICAL CARE IF:   You feel like you want to stop breastfeeding or have become frustrated with breastfeeding.  You have painful breasts or nipples.  Your nipples are cracked or bleeding.  Your breasts are red, tender, or warm.  You have a swollen area on either breast.  You have a fever or chills.  You have nausea or vomiting.  You have drainage other than breast milk from your nipples.  Your breasts do not become full before feedings by the fifth day after you give birth.  You feel sad and depressed.  Your baby is too sleepy to eat well.  Your baby is having trouble sleeping.   Your baby is wetting less than 3 diapers in a 24-hour period.  Your baby has less than 3 stools in a 24-hour period.  Your baby's skin or the  white part of his or her eyes becomes yellow.   Your baby is not gaining weight by 23 days of age. SEEK IMMEDIATE MEDICAL CARE IF:   Your baby is overly tired (lethargic) and does not want to wake up and feed.  Your baby develops an unexplained fever. Document Released: 03/29/2005 Document Revised: 04/03/2013 Document Reviewed: 09/20/2012 [facility] Patient Information 2015 [place], [place]. This information is not intended to replace advice given to you by your health care provider. Make sure you discuss any questions you have with your health care provider.

## 2014-10-14 ENCOUNTER — Ambulatory Visit: Payer: Self-pay

## 2014-10-14 NOTE — Progress Notes (Signed)
Post discharge chart review completed.  

## 2014-10-14 NOTE — Lactation Note (Signed)
This note was copied from the chart of [place]. Lactation Consultation Note; mother placed in sidelying position. Infant latched on with good depth. Mother has scabs on areola from pumping. Observed frequent audible swallows. Mother has a hand pump and an electric pump. Mother advised to continue to breast feed  8-12 times in 24 hours. Mother advised in treatment to prevent severe engorgement. Mother is aware of available lactation support. She is active with WIC. Mother will call if wants an out patient visit.   Patient Name: [name] TKPTW'S Date: 10/14/2014     Maternal Data    Feeding    LATCH Score/Interventions                      Lactation Tools Discussed/Used     Consult Status      Darla Lesches 10/14/2014, 4:09 PM

## 2014-12-21 ENCOUNTER — Encounter (HOSPITAL_COMMUNITY): Payer: Self-pay | Admitting: *Deleted

## 2014-12-21 ENCOUNTER — Inpatient Hospital Stay (HOSPITAL_COMMUNITY)
Admission: AD | Admit: 2014-12-21 | Discharge: 2014-12-21 | Disposition: A | Discharge status: Home / Self Care | Payer: Medicaid Other | Source: Ambulatory Visit | Attending: Obstetrics and Gynecology | Admitting: Obstetrics and Gynecology

## 2014-12-21 DIAGNOSIS — N644 Mastodynia: Secondary | ICD-10-CM | POA: Insufficient documentation

## 2014-12-21 DIAGNOSIS — O9089 Other complications of the puerperium, not elsewhere classified: Secondary | ICD-10-CM | POA: Insufficient documentation

## 2014-12-21 MED ORDER — CEPHALEXIN 500 MG PO CAPS: 500.0000 mg | ORAL_CAPSULE | Freq: Four times a day (QID) | ORAL | Status: AC

## 2014-12-21 MED ORDER — IBUPROFEN 600 MG PO TABS
600.0000 mg | ORAL_TABLET | Freq: Once | ORAL | Status: AC
  Administered 2014-12-21: 600 mg via ORAL
  Filled 2014-12-21: qty 1

## 2014-12-21 MED ORDER — IBUPROFEN 600 MG PO TABS: 600.0000 mg | ORAL_TABLET | Freq: Four times a day (QID) | ORAL | Status: DC

## 2014-12-21 NOTE — Lactation Note (Signed)
Lactation Consultation Note Lactation visit in MAU, called to visit for pump kit.  Mom is complaining of right breast pain near nipple and she can feel a small "pea sized knot."  Warm moist compress applied for several minutes, mom reports less pain.  LC assessed breast, no plugged ducts palpated, but mom is crying with pain.  Noticed mom wearing under wire bra and explained how that affects milk supply and comfort.  Mom also reports only feeding baby in football hold.  Assisted with latching baby in cross cradle hold on right breast to allow for stimulation of breast in opposite location from football hold.  Mom reports improved comfort from previous latches in football hold. Mom has been using hand pump PRN and prefers over DEBP.  Encouraged mom apply moist heat before feedings and pump for comfort if baby is not latching every 3 hours.  Discussed management of plugged ducts for home care.  MAU RN reports provider will assess mom soon, mom aware to call [facility] o/p services as needed.   Patient Name: [name] Date: 12/21/2014     Maternal Data    Feeding    LATCH Score/Interventions                      Lactation Tools Discussed/Used     Consult Status      Shoptaw, Justine Null 12/21/2014, 4:17 PM

## 2014-12-21 NOTE — MAU Provider Note (Signed)
History   24 yo, [id number], s/p SVD on 10/11/14  Presents for right breast pain that increases when touched or breastfeeding.  States painful area has been red for over 1 wk, is warm to the touch, and contains a hard/ball like area. Pain worsened last night.  Took a tylenol earlier today and received relief but the pain returned.  Denies headache, fever, malaise, bleeding or discharge from breasts.   Patient Active Problem List   Diagnosis Date Noted  . Vaginal delivery 10/11/2014  . Cramping affecting pregnancy, antepartum 10/03/2014  . Abdominal pain in pregnancy 05/22/2014  . H/O rapid labor 05/22/2014  . Enterococcus UTI at [facility] 05/22/2014  . Spotting affecting pregnancy 05/22/2014    Chief Complaint  Patient presents with  . Breast Pain   HPI  See above  OB History    Gravida Para Term Preterm AB TAB SAB Ectopic Multiple Living   _0 0 3      Past Medical History  Diagnosis Date  . Medical history non-contributory   . Normal labor 10/11/2014    Past Surgical History  Procedure Laterality Date  . No past surgeries      Family History  Problem Relation Age of Onset  . Diabetes Mother   . Heart disease Mother     Social History  Substance Use Topics  . Smoking status: Never Smoker   . Smokeless tobacco: Never Used  . Alcohol Use: No    Allergies: No Known Allergies  Prescriptions prior to admission  Medication Sig Dispense Refill Last Dose  . acetaminophen (TYLENOL) 325 MG tablet Take 325 mg by mouth every 6 (six) hours as needed.   12/21/2014 at Unknown time  . Prenatal Vit-Fe Fumarate-FA (PRENATAL MULTIVITAMIN) TABS tablet Take 1 tablet by mouth daily.    12/20/2014 at Unknown time  . ferrous sulfate 325 (65 FE) MG EC tablet Take 1 tablet (325 mg total) by mouth 2 (two) times daily. (Patient not taking: Reported on 12/21/2014) 60 tablet 3   . ibuprofen (ADVIL,MOTRIN) 600 MG tablet Take 1 tablet (600 mg total) by mouth every 6 (six) hours. (Patient not  taking: Reported on 12/21/2014) 30 tablet 0   . oxyCODONE-acetaminophen (PERCOCET/ROXICET) 5-325 MG per tablet Take 1 tablet by mouth every 4 (four) hours as needed (for pain scale 4-7). (Patient not taking: Reported on 12/21/2014) 30 tablet 0     ROS  Pain in Right breast that increases when touched or breastfeeding.  Physical Exam   Blood pressure 137/76, pulse 94, temperature 98.6 F (37 C), temperature source Oral, resp. rate 18, height 5' (1.524 m), weight 77.111 kg (170 lb), unknown if currently breastfeeding.    Physical Exam  Right  Breast - red irregualr area from right nipple into inner aspect of breast located at 5 o'clock from nipple. Small prominent ductal tissue palpated.    Tender to touch. Appears consistent with early mastitis.    Lactation in to see patient Lactation Consultation Note Lactation visit in MAU, called to visit for pump kit. Mom is complaining of right breast pain near nipple and she can feel a small "pea sized knot." Warm moist compress applied for several minutes, mom reports less pain. LC assessed breast, no plugged ducts palpated, but mom is crying with pain. Noticed mom wearing under wire bra and explained how that affects milk supply and comfort. Mom also reports only feeding baby in football hold. Assisted with latching baby in cross  cradle hold on right breast to allow for stimulation of breast in opposite location from football hold. Mom reports improved comfort from previous latches in football hold. Mom has been using hand pump PRN and prefers over DEBP. Encouraged mom apply moist heat before feedings and pump for comfort if baby is not latching every 3 hours. Discussed management of plugged ducts for home care. MAU RN reports provider will assess mom soon, mom aware to call [facility] o/p services as needed.   ED Course  Assessment: S/p SVD on  10/11/14 Possible clogged duct Possible early mastitis    Plan: Lactation consultation  Warm compress  applied in MAU - continue at home Recommend continuation of breastfeeding on both breasts Rx ibuprofen and Keflex Discharge home Follow up as scheduled or needed  Call if any worsening of S&S   Donnel Saxon CNM, MSN 12/21/2014 4:40 PM

## 2014-12-21 NOTE — Discharge Instructions (Signed)
Breastfeeding and Mastitis °Mastitis is inflammation of the breast tissue. It can occur in women who are breastfeeding. This can make breastfeeding painful. Mastitis will sometimes go away on its own. Your health care provider will help determine if treatment is needed. °CAUSES °Mastitis is often associated with a blocked milk (lactiferous) duct. This can happen when too much milk builds up in the breast. Causes of excess milk in the breast can include: °· Poor latch-on. If your baby is not latched onto the breast properly, she or he may not empty your breast completely while breastfeeding. °· Allowing too much time to pass between feedings. °· Wearing a bra or other clothing that is too tight. This puts extra pressure on the lactiferous ducts so milk does not flow through them as it should. °Mastitis can also be caused by a bacterial infection. Bacteria may enter the breast tissue through cuts or openings in the skin. In women who are breastfeeding, this may occur because of cracked or irritated skin. Cracks in the skin are often caused when your baby does not latch on properly to the breast. °SIGNS AND SYMPTOMS °· Swelling, redness, tenderness, and pain in an area of the breast. °· Swelling of the glands under the arm on the same side. °· Fever may or may not accompany mastitis. °If an infection is allowed to progress, a collection of pus (abscess) may develop. °DIAGNOSIS  °Your health care provider can usually diagnose mastitis based on your symptoms and a physical exam. Tests may be done to help confirm the diagnosis. These may include: °· Removal of pus from the breast by applying pressure to the area. This pus can be examined in the lab to determine which bacteria are present. If an abscess has developed, the fluid in the abscess can be removed with a needle. This can also be used to confirm the diagnosis and determine the bacteria present. In most cases, pus will not be present. °· Blood tests to determine if  your body is fighting a bacterial infection. °· Mammogram or ultrasound tests to rule out other problems or diseases. °TREATMENT  °Mastitis that occurs with breastfeeding will sometimes go away on its own. Your health care provider may choose to wait 24 hours after first seeing you to decide whether a prescription medicine is needed. If your symptoms are worse after 24 hours, your health care provider will likely prescribe an antibiotic medicine to treat the mastitis. He or she will determine which bacteria are most likely causing the infection and will then select an appropriate antibiotic medicine. This is sometimes changed based on the results of tests performed to identify the bacteria, or if there is no response to the antibiotic medicine selected. Antibiotic medicines are usually given by mouth. You may also be given medicine for pain. °HOME CARE INSTRUCTIONS °· Only take over-the-counter or prescription medicines for pain, fever, or discomfort as directed by your health care provider. °· If your health care provider prescribed an antibiotic medicine, take the medicine as directed. Make sure you finish it even if you start to feel better. °· Do not wear a tight or underwire bra. Wear a soft, supportive bra. °· Increase your fluid intake, especially if you have a fever. °· Continue to empty the breast. Your health care provider can tell you whether this milk is safe for your infant or needs to be thrown out. You may be told to stop nursing until your health care provider thinks it is safe for your baby.   Use a breast pump if you are advised to stop nursing. °· Keep your nipples clean and dry. °· Empty the first breast completely before going to the other breast. If your baby is not emptying your breasts completely for some reason, use a breast pump to empty your breasts. °· If you go back to work, pump your breasts while at work to stay in time with your nursing schedule. °· Avoid allowing your breasts to become  overly filled with milk (engorged). °SEEK MEDICAL CARE IF: °· You have pus-like discharge from the breast. °· Your symptoms do not improve with the treatment prescribed by your health care provider within 2 days. °SEEK IMMEDIATE MEDICAL CARE IF: °· Your pain and swelling are getting worse. °· You have pain that is not controlled with medicine. °· You have a red line extending from the breast toward your armpit. °· You have a fever or persistent symptoms for more than 2-3 days. °· You have a fever and your symptoms suddenly get worse. °MAKE SURE YOU:  °· Understand these instructions. °· Will watch your condition. °· Will get help right away if you are not doing well or get worse. °Document Released: 07/24/2004 Document Revised: 04/03/2013 Document Reviewed: 11/02/2012 °[employer]® Patient Information ©2015 [employer]. This information is not intended to replace advice given to you by your health care provider. Make sure you discuss any questions you have with your health care provider. ° °

## 2014-12-21 NOTE — MAU Note (Signed)
Lactation consultant in to see patient.

## 2014-12-21 NOTE — MAU Note (Addendum)
Patient presents post vaginal delivery on 10/11/14 with c/o right breast pain X 1 week that lessened with tylenol but worsened last night. Breast is painful from inner nipple area outward and appears slightly reddened and warm. Painful to touch. Denies bleeding or discharge.

## 2015-05-23 ENCOUNTER — Encounter (HOSPITAL_COMMUNITY): Payer: Self-pay | Admitting: Emergency Medicine

## 2015-05-23 ENCOUNTER — Emergency Department (HOSPITAL_COMMUNITY): Payer: Medicaid Other

## 2015-05-23 ENCOUNTER — Emergency Department (HOSPITAL_COMMUNITY)
Admission: EM | Admit: 2015-05-23 | Discharge: 2015-05-23 | Disposition: A | Discharge status: Home / Self Care | Payer: Medicaid Other | Attending: Emergency Medicine | Admitting: Emergency Medicine

## 2015-05-23 DIAGNOSIS — M542 Cervicalgia: Secondary | ICD-10-CM

## 2015-05-23 DIAGNOSIS — Y92410 Unspecified street and highway as the place of occurrence of the external cause: Secondary | ICD-10-CM | POA: Insufficient documentation

## 2015-05-23 DIAGNOSIS — Y999 Unspecified external cause status: Secondary | ICD-10-CM | POA: Insufficient documentation

## 2015-05-23 DIAGNOSIS — S199XXA Unspecified injury of neck, initial encounter: Secondary | ICD-10-CM | POA: Insufficient documentation

## 2015-05-23 DIAGNOSIS — S299XXA Unspecified injury of thorax, initial encounter: Secondary | ICD-10-CM | POA: Insufficient documentation

## 2015-05-23 DIAGNOSIS — Y9389 Activity, other specified: Secondary | ICD-10-CM | POA: Diagnosis not present

## 2015-05-23 DIAGNOSIS — M546 Pain in thoracic spine: Secondary | ICD-10-CM

## 2015-05-23 MED ORDER — IBUPROFEN 800 MG PO TABS
800.0000 mg | ORAL_TABLET | Freq: Once | ORAL | Status: AC
  Administered 2015-05-23: 800 mg via ORAL
  Filled 2015-05-23: qty 1

## 2015-05-23 NOTE — Discharge Instructions (Signed)
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
## 2015-10-26 ENCOUNTER — Inpatient Hospital Stay (HOSPITAL_COMMUNITY)
Admission: AD | Admit: 2015-10-26 | Discharge: 2015-10-26 | Disposition: A | Discharge status: Home / Self Care | Payer: Medicaid Other | Source: Ambulatory Visit | Attending: Obstetrics & Gynecology | Admitting: Obstetrics & Gynecology

## 2015-10-26 ENCOUNTER — Encounter (HOSPITAL_COMMUNITY): Payer: Self-pay | Admitting: *Deleted

## 2015-10-26 DIAGNOSIS — R109 Unspecified abdominal pain: Secondary | ICD-10-CM | POA: Insufficient documentation

## 2015-10-26 DIAGNOSIS — Z79899 Other long term (current) drug therapy: Secondary | ICD-10-CM | POA: Insufficient documentation

## 2015-10-26 DIAGNOSIS — N72 Inflammatory disease of cervix uteri: Secondary | ICD-10-CM

## 2015-10-26 DIAGNOSIS — R1030 Lower abdominal pain, unspecified: Secondary | ICD-10-CM | POA: Diagnosis not present

## 2015-10-26 DIAGNOSIS — R51 Headache: Secondary | ICD-10-CM | POA: Diagnosis not present

## 2015-10-26 LAB — URINALYSIS, ROUTINE W REFLEX MICROSCOPIC
Bilirubin Urine: NEGATIVE
GLUCOSE, UA: NEGATIVE mg/dL
Hgb urine dipstick: NEGATIVE
Ketones, ur: NEGATIVE mg/dL
LEUKOCYTES UA: NEGATIVE
NITRITE: NEGATIVE
PH: 6 (ref 5.0–8.0)
Protein, ur: NEGATIVE mg/dL
SPECIFIC GRAVITY, URINE: 1.025 (ref 1.005–1.030)

## 2015-10-26 LAB — CBC WITH DIFFERENTIAL/PLATELET
BASOS ABS: 0 10*3/uL (ref 0.0–0.1)
BASOS PCT: 0 %
EOS ABS: 0.2 10*3/uL (ref 0.0–0.7)
Eosinophils Relative: 3 %
HEMATOCRIT: 39.7 % (ref 36.0–46.0)
HEMOGLOBIN: 13.8 g/dL (ref 12.0–15.0)
Lymphocytes Relative: 35 %
Lymphs Abs: 2.5 10*3/uL (ref 0.7–4.0)
MCH: 30.9 pg (ref 26.0–34.0)
MCHC: 34.8 g/dL (ref 30.0–36.0)
MCV: 89 fL (ref 78.0–100.0)
MONOS PCT: 4 %
Monocytes Absolute: 0.3 10*3/uL (ref 0.1–1.0)
NEUTROS ABS: 4.3 10*3/uL (ref 1.7–7.7)
NEUTROS PCT: 58 %
Platelets: 193 10*3/uL (ref 150–400)
RBC: 4.46 MIL/uL (ref 3.87–5.11)
RDW: 12.7 % (ref 11.5–15.5)
WBC: 7.2 10*3/uL (ref 4.0–10.5)

## 2015-10-26 LAB — WET PREP, GENITAL
CLUE CELLS WET PREP: NONE SEEN
SPERM: NONE SEEN
TRICH WET PREP: NONE SEEN
YEAST WET PREP: NONE SEEN

## 2015-10-26 LAB — POCT PREGNANCY, URINE: Preg Test, Ur: NEGATIVE

## 2015-10-26 MED ORDER — CEFTRIAXONE SODIUM 250 MG IJ SOLR
250.0000 mg | Freq: Once | INTRAMUSCULAR | Status: AC
  Administered 2015-10-26: 250 mg via INTRAMUSCULAR
  Filled 2015-10-26: qty 250

## 2015-10-26 MED ORDER — AZITHROMYCIN 250 MG PO TABS
1000.0000 mg | ORAL_TABLET | Freq: Once | ORAL | Status: AC
  Administered 2015-10-26: 1000 mg via ORAL
  Filled 2015-10-26: qty 4

## 2015-10-26 MED ORDER — GI COCKTAIL ~~LOC~~
30.0000 mL | Freq: Once | ORAL | Status: AC
  Administered 2015-10-26: 30 mL via ORAL
  Filled 2015-10-26: qty 30

## 2015-10-26 MED ORDER — BUTALBITAL-APAP-CAFFEINE 50-325-40 MG PO TABS: 1.0000 | ORAL_TABLET | Freq: Four times a day (QID) | ORAL | Status: AC | PRN

## 2015-10-26 MED ORDER — KETOROLAC TROMETHAMINE 60 MG/2ML IM SOLN
60.0000 mg | Freq: Once | INTRAMUSCULAR | Status: AC
  Administered 2015-10-26: 60 mg via INTRAMUSCULAR
  Filled 2015-10-26: qty 2

## 2015-10-26 NOTE — MAU Note (Signed)
Pt states she has been having abdominal cramping starting on 10/15/15.  Pt states she started her period on 10/17/15 and it stopped on 10/19/2015.  Pt states before she stopped bleeding the blood looked black.  Later on 10/19/15 it turned pink again and then stopped.  Since then the abdominal pain has continued but it comes and goes.  Pt states the pain is on top of the belly button and on the bottom.  Pt states every time it comes back the pain gets worse.  Pt states this is her first period since her delivery on 10/11/14.

## 2015-10-26 NOTE — MAU Provider Note (Signed)
History     CSN: [id number]  Arrival date and time: 10/26/15 1352   None     Chief Complaint  Patient presents with  . Abdominal Pain   HPI Pt is not pregnant 19 who presents with abdominal pain for 11 days. The pain occurs day and night and is not related to meals. The pain is described as sharp, stabbing pain and starts LUQ and radiates to RLQ.  Pt denies nausea or vomiting. Pt has taken tylenol and ibuprofen for the pain without relief.  Pt took a natural remedy of basil, beetroot concoction x2  for the pain and pt has experienced diarrhea- loose stools. Pt has not taken any more of the concoction. Pt states she delivered 1 year ago and is weaning her son and had her 1st period on 10/17/2015 and stopped on 10/19/2015. Pt has a little burning after urination; denies fever or chills Pt also has hx of headaches with vision changes that she takes [employer] Headache powder and Pepsi  RN note:   Expand All Collapse All   Pt states she has been having abdominal cramping starting on 10/15/15. Pt states she started her period on 10/17/15 and it stopped on 10/19/2015. Pt states before she stopped bleeding the blood looked black. Later on 10/19/15 it turned pink again and then stopped. Since then the abdominal pain has continued but it comes and goes. Pt states the pain is on top of the belly button and on the bottom. Pt states every time it comes back the pain gets worse. Pt states this is her first period since her delivery on 10/11/14.         Past Medical History  Diagnosis Date  . Medical history non-contributory   . Normal labor 10/11/2014    Past Surgical History  Procedure Laterality Date  . No past surgeries      Family History  Problem Relation Age of Onset  . Diabetes Mother   . Heart disease Mother     Social History  Substance Use Topics  . Smoking status: Never Smoker   . Smokeless tobacco: Never Used  . Alcohol Use: No    Allergies: No Known  Allergies  Prescriptions prior to admission  Medication Sig Dispense Refill Last Dose  . ibuprofen (ADVIL,MOTRIN) 600 MG tablet Take 1 tablet (600 mg total) by mouth 4 (four) times daily. Take regularly x 24-48 hrs, then as needed for breast pain. 30 tablet 0   . Prenatal Vit-Fe Fumarate-FA (PRENATAL MULTIVITAMIN) TABS tablet Take 1 tablet by mouth daily.    12/20/2014 at Unknown time    Review of Systems  Constitutional: Negative for fever and chills.  HENT:       Not at present  Gastrointestinal: Positive for abdominal pain and diarrhea. Negative for nausea, vomiting and constipation.  Genitourinary: Negative for dysuria.  Neurological: Positive for dizziness and headaches.   Physical Exam   Blood pressure 142/80, pulse 74, temperature 98.5 F (36.9 C), temperature source Oral, resp. rate 16, last menstrual period 10/17/2015, currently breastfeeding.  Physical Exam  Nursing note and vitals reviewed. Constitutional: She appears well-developed and well-nourished. No distress.  HENT:  Head: Normocephalic.  Eyes: Pupils are equal, round, and reactive to light.  Neck: Normal range of motion. Neck supple.  Cardiovascular: Normal rate.   Respiratory: Effort normal.  GI: Soft.    MAU Course  Procedures Results for orders placed or performed during the hospital encounter of 10/26/15 (from the past 24 hour(s))  Urinalysis, Routine w reflex microscopic (not at [facility])     Status: None   Collection Time: 10/26/15  2:00 PM  Result Value Ref Range   Color, Urine YELLOW YELLOW   APPearance CLEAR CLEAR   Specific Gravity, Urine 1.025 1.005 - 1.030   pH 6.0 5.0 - 8.0   Glucose, UA NEGATIVE NEGATIVE mg/dL   Hgb urine dipstick NEGATIVE NEGATIVE   Bilirubin Urine NEGATIVE NEGATIVE   Ketones, ur NEGATIVE NEGATIVE mg/dL   Protein, ur NEGATIVE NEGATIVE mg/dL   Nitrite NEGATIVE NEGATIVE   Leukocytes, UA NEGATIVE NEGATIVE  Pregnancy, urine POC     Status: None   Collection Time: 10/26/15   2:11 PM  Result Value Ref Range   Preg Test, Ur NEGATIVE NEGATIVE  CBC with Differential     Status: None   Collection Time: 10/26/15  2:38 PM  Result Value Ref Range   WBC 7.2 4.0 - 10.5 K/uL   RBC 4.46 3.87 - 5.11 MIL/uL   Hemoglobin 13.8 12.0 - 15.0 g/dL   HCT 39.7 36.0 - 46.0 %   MCV 89.0 78.0 - 100.0 fL   MCH 30.9 26.0 - 34.0 pg   MCHC 34.8 30.0 - 36.0 g/dL   RDW 12.7 11.5 - 15.5 %   Platelets 193 150 - 400 K/uL   Neutrophils Relative % 58 %   Neutro Abs 4.3 1.7 - 7.7 K/uL   Lymphocytes Relative 35 %   Lymphs Abs 2.5 0.7 - 4.0 K/uL   Monocytes Relative 4 %   Monocytes Absolute 0.3 0.1 - 1.0 K/uL   Eosinophils Relative 3 %   Eosinophils Absolute 0.2 0.0 - 0.7 K/uL   Basophils Relative 0 %   Basophils Absolute 0.0 0.0 - 0.1 K/uL  Wet prep, genital     Status: Abnormal   Collection Time: 10/26/15  3:04 PM  Result Value Ref Range   Yeast Wet Prep HPF POC NONE SEEN NONE SEEN   Trich, Wet Prep NONE SEEN NONE SEEN   Clue Cells Wet Prep HPF POC NONE SEEN NONE SEEN   WBC, Wet Prep HPF POC FEW (A) NONE SEEN   Sperm NONE SEEN   GI cocktail and Toradol 60mg  IM given for pain with relief- No pain at time of discharge Discussed contraception with pt- since pt has migraine with visual changes, OCs is not an option Recommended pt to go to clinic for contraception Also discussed resumption of menses after weaning Recommended application of cold cabbage leaves applied to breasts every 2 hours while awake to help dry up breasts Discussed Mylanta and diet to decrease GI discomfort  Assessment and Plan  Abdominal pain relieved with GI cocktail and toradol Contraception - recommend [facility] clinic Headaches- Rx Fioricet  Caylin Nass 10/26/2015, 2:10 PM

## 2015-10-27 LAB — GC/CHLAMYDIA PROBE AMP (~~LOC~~) NOT AT ARMC
Chlamydia: NEGATIVE
NEISSERIA GONORRHEA: NEGATIVE

## 2016-01-26 ENCOUNTER — Other Ambulatory Visit: Payer: Self-pay | Admitting: Family Medicine

## 2016-01-26 DIAGNOSIS — R102 Pelvic and perineal pain: Secondary | ICD-10-CM

## 2016-01-26 DIAGNOSIS — R109 Unspecified abdominal pain: Secondary | ICD-10-CM

## 2016-02-03 ENCOUNTER — Ambulatory Visit
Admission: RE | Admit: 2016-02-03 | Discharge: 2016-02-03 | Disposition: A | Discharge status: Home / Self Care | Payer: Medicaid Other | Source: Ambulatory Visit | Attending: Family Medicine | Admitting: Family Medicine

## 2016-02-03 DIAGNOSIS — R109 Unspecified abdominal pain: Secondary | ICD-10-CM

## 2016-02-03 DIAGNOSIS — R102 Pelvic and perineal pain: Secondary | ICD-10-CM
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
## 2016-09-13 ENCOUNTER — Encounter (HOSPITAL_COMMUNITY): Payer: Self-pay

## 2016-09-13 ENCOUNTER — Emergency Department (HOSPITAL_COMMUNITY)
Admission: EM | Admit: 2016-09-13 | Discharge: 2016-09-13 | Disposition: A | Discharge status: Home / Self Care | Payer: Medicaid Other | Attending: Emergency Medicine | Admitting: Emergency Medicine

## 2016-09-13 DIAGNOSIS — T63441A Toxic effect of venom of bees, accidental (unintentional), initial encounter: Secondary | ICD-10-CM | POA: Insufficient documentation

## 2016-09-13 DIAGNOSIS — L03116 Cellulitis of left lower limb: Secondary | ICD-10-CM | POA: Diagnosis not present

## 2016-09-13 MED ORDER — SULFAMETHOXAZOLE-TRIMETHOPRIM 800-160 MG PO TABS: 1.0000 | ORAL_TABLET | Freq: Once | ORAL | Status: DC

## 2016-09-13 MED ORDER — CEPHALEXIN 500 MG PO CAPS: 500.0000 mg | ORAL_CAPSULE | Freq: Four times a day (QID) | ORAL | 0 refills | Status: DC

## 2016-09-13 MED ORDER — CEPHALEXIN 250 MG PO CAPS
500.0000 mg | ORAL_CAPSULE | Freq: Once | ORAL | Status: AC
  Administered 2016-09-13: 500 mg via ORAL
  Filled 2016-09-13: qty 2

## 2016-09-13 NOTE — ED Triage Notes (Signed)
Pt states she was stung by a bee on her left foot Saturday. Some redness noted. Pt has tried home remedies but states pain persists and radiates into her leg.

## 2016-09-13 NOTE — ED Provider Notes (Signed)
[place] DEPT Provider Note   CSN: [id number] Arrival date & time: 09/13/16  1716  By signing my name below, I, Levester Fresh, attest that this documentation has been prepared under the direction and in the presence of Virgel Manifold, MD . Electronically Signed: Levester Fresh, Scribe. 09/13/2016. 6:47 PM.  History   Chief Complaint Chief Complaint  Patient presents with  . Insect Bite   HPI Comments [name] is a 26 y.o. female with no significant PMHx, who presents to the Emergency Department with complaints of left foot pain, after being stung by a bee x2 days ago.  This is associated with nausea, localized erythema and edema.  Sx have worsened since onset, currently rated a 10/10 in severity.  No hx of allergies. Nausea resolved.  She experienced no symptomatic relief with at home remedies, including topical application of tobacco leaves. Pt denies experiencing any other acute sx, including fevers, chills, vomiting or dyspnea.   The history is provided by the patient. No language interpreter was used.    Past Medical History:  Diagnosis Date  . Medical history non-contributory   . Normal labor 10/11/2014    Patient Active Problem List   Diagnosis Date Noted  . Vaginal delivery 10/11/2014  . Cramping affecting pregnancy, antepartum 10/03/2014  . Abdominal pain in pregnancy 05/22/2014  . H/O rapid labor 05/22/2014  . Enterococcus UTI at [facility] 05/22/2014  . Spotting affecting pregnancy 05/22/2014    Past Surgical History:  Procedure Laterality Date  . NO PAST SURGERIES      OB History    Gravida Para Term Preterm AB Living   4 3 2 1 1 3    SAB TAB Ectopic Multiple Live Births   1     0 3       Home Medications    Prior to Admission medications   Medication Sig Start Date End Date Taking? Authorizing Provider  acetaminophen (TYLENOL) 500 MG tablet Take 500 mg by mouth every 6 (six) hours as needed for mild pain or headache.    [provider]  butalbital-acetaminophen-caffeine (FIORICET) [phone] MG tablet Take 1-2 tablets by mouth every 6 (six) hours as needed for headache. 10/26/15 10/25/16  West Pugh, NP  ibuprofen (ADVIL,MOTRIN) 200 MG tablet Take 200 mg by mouth every 6 (six) hours as needed for headache or mild pain.    [provider]  Prenatal Vit-Fe Fumarate-FA (PRENATAL MULTIVITAMIN) TABS tablet Take 1 tablet by mouth daily.     [provider]    Family History Family History  Problem Relation Age of Onset  . Diabetes Mother   . Heart disease Mother     Social History Social History  Substance Use Topics  . Smoking status: Never Smoker  . Smokeless tobacco: Never Used  . Alcohol use No     Allergies   Patient has no known allergies.   Review of Systems Review of Systems  Constitutional: Negative for chills and fever.  Respiratory: Negative for shortness of breath.   Gastrointestinal: Positive for nausea (Resolved). Negative for vomiting.  Musculoskeletal: Negative for gait problem.  Skin:       Left foot with area of erythema and edema.   Physical Exam Updated Vital Signs BP 125/74 (BP Location: Right Arm)   Pulse 85   Temp 98.9 F (37.2 C) (Oral)   Resp 16   LMP 08/28/2016 (Exact Date)   SpO2 99%   Physical Exam  Constitutional: She is oriented  to person, place, and time. She appears well-developed and well-nourished. No distress.  HENT:  Head: Normocephalic and atraumatic.  Eyes: EOM are normal.  Neck: Normal range of motion.  Cardiovascular: Normal rate, regular rhythm and normal heart sounds.   Pulmonary/Chest: Effort normal and breath sounds normal.  Abdominal: Soft. She exhibits no distension. There is no tenderness.  Musculoskeletal: Normal range of motion.  Neurological: She is alert and oriented to person, place, and time.  Skin: Skin is warm and dry.  Second toe, left foot with mild erythema and swelling extending up to the dorsal aspect  of the foot. Palpable DP pulse. Can move her toes.   Psychiatric: She has a normal mood and affect. Judgment normal.  Nursing note and vitals reviewed.  ED Treatments / Results  DIAGNOSTIC STUDIES: Oxygen Saturation is 99% on room air, normal by my interpretation.    COORDINATION OF CARE: 6:48 PM Discussed treatment plan with pt at bedside and pt agreed to plan.  Labs (all labs ordered are listed, but only abnormal results are displayed) Labs Reviewed - No data to display  EKG  EKG Interpretation None       Radiology No results found.  Procedures Procedures (including critical care time)  Medications Ordered in ED Medications - No data to display   Initial Impression / Assessment and Plan / ED Course  I have reviewed the triage vital signs and the nursing notes.  Pertinent labs & imaging results that were available during my care of the patient were reviewed by me and considered in my medical decision making (see chart for details).     26yF with bee sting to toe which has developed into mild cellulitis. NVI. Abx. It has been determined that no acute conditions requiring further emergency intervention are present at this time. The patient has been advised of the diagnosis and plan. I reviewed any labs and imaging including any potential incidental findings. We have discussed signs and symptoms that warrant return to the ED and they are listed in the discharge instructions.   Final Clinical Impressions(s) / ED Diagnoses   Final diagnoses:  Cellulitis of left lower extremity   I personally preformed the services scribed in my presence. The recorded information has been reviewed is accurate. Virgel Manifold, MD.   New Prescriptions New Prescriptions   No medications on file     Virgel Manifold, MD 09/20/16 1049

## 2016-11-08 ENCOUNTER — Inpatient Hospital Stay (HOSPITAL_COMMUNITY): Payer: Medicaid Other

## 2016-11-08 ENCOUNTER — Inpatient Hospital Stay (HOSPITAL_COMMUNITY)
Admission: AD | Admit: 2016-11-08 | Discharge: 2016-11-08 | Disposition: A | Discharge status: Home / Self Care | Payer: Medicaid Other | Source: Ambulatory Visit | Attending: Obstetrics & Gynecology | Admitting: Obstetrics & Gynecology

## 2016-11-08 ENCOUNTER — Encounter (HOSPITAL_COMMUNITY): Payer: Self-pay | Admitting: *Deleted

## 2016-11-08 DIAGNOSIS — O3680X0 Pregnancy with inconclusive fetal viability, not applicable or unspecified: Secondary | ICD-10-CM | POA: Insufficient documentation

## 2016-11-08 DIAGNOSIS — Z3202 Encounter for pregnancy test, result negative: Secondary | ICD-10-CM | POA: Insufficient documentation

## 2016-11-08 DIAGNOSIS — O26899 Other specified pregnancy related conditions, unspecified trimester: Secondary | ICD-10-CM

## 2016-11-08 DIAGNOSIS — R109 Unspecified abdominal pain: Secondary | ICD-10-CM | POA: Insufficient documentation

## 2016-11-08 DIAGNOSIS — Z8249 Family history of ischemic heart disease and other diseases of the circulatory system: Secondary | ICD-10-CM | POA: Diagnosis not present

## 2016-11-08 DIAGNOSIS — O09211 Supervision of pregnancy with history of pre-term labor, first trimester: Secondary | ICD-10-CM

## 2016-11-08 DIAGNOSIS — M549 Dorsalgia, unspecified: Secondary | ICD-10-CM | POA: Insufficient documentation

## 2016-11-08 DIAGNOSIS — O26891 Other specified pregnancy related conditions, first trimester: Secondary | ICD-10-CM | POA: Diagnosis not present

## 2016-11-08 DIAGNOSIS — Z3A01 Less than 8 weeks gestation of pregnancy: Secondary | ICD-10-CM | POA: Insufficient documentation

## 2016-11-08 DIAGNOSIS — Z833 Family history of diabetes mellitus: Secondary | ICD-10-CM | POA: Insufficient documentation

## 2016-11-08 LAB — CBC WITH DIFFERENTIAL/PLATELET
BASOS ABS: 0 10*3/uL (ref 0.0–0.1)
BASOS PCT: 0 %
EOS ABS: 0.1 10*3/uL (ref 0.0–0.7)
EOS PCT: 1 %
HEMATOCRIT: 38.7 % (ref 36.0–46.0)
Hemoglobin: 13.7 g/dL (ref 12.0–15.0)
Lymphocytes Relative: 31 %
Lymphs Abs: 2.6 10*3/uL (ref 0.7–4.0)
MCH: 31.8 pg (ref 26.0–34.0)
MCHC: 35.4 g/dL (ref 30.0–36.0)
MCV: 89.8 fL (ref 78.0–100.0)
MONO ABS: 0.2 10*3/uL (ref 0.1–1.0)
MONOS PCT: 3 %
NEUTROS ABS: 5.6 10*3/uL (ref 1.7–7.7)
Neutrophils Relative %: 65 %
PLATELETS: 212 10*3/uL (ref 150–400)
RBC: 4.31 MIL/uL (ref 3.87–5.11)
RDW: 12.9 % (ref 11.5–15.5)
WBC: 8.6 10*3/uL (ref 4.0–10.5)

## 2016-11-08 LAB — WET PREP, GENITAL
CLUE CELLS WET PREP: NONE SEEN
Sperm: NONE SEEN
TRICH WET PREP: NONE SEEN
Yeast Wet Prep HPF POC: NONE SEEN

## 2016-11-08 LAB — COMPREHENSIVE METABOLIC PANEL
ALBUMIN: 4.1 g/dL (ref 3.5–5.0)
ALT: 32 U/L (ref 14–54)
ANION GAP: 6 (ref 5–15)
AST: 15 U/L (ref 15–41)
Alkaline Phosphatase: 70 U/L (ref 38–126)
BILIRUBIN TOTAL: 0.8 mg/dL (ref 0.3–1.2)
BUN: 12 mg/dL (ref 6–20)
CHLORIDE: 110 mmol/L (ref 101–111)
CO2: 21 mmol/L — ABNORMAL LOW (ref 22–32)
Calcium: 8.8 mg/dL — ABNORMAL LOW (ref 8.9–10.3)
Creatinine, Ser: 0.46 mg/dL (ref 0.44–1.00)
GFR calc Af Amer: >60 mL/min (ref >60)
GLUCOSE: 100 mg/dL — AB (ref 65–99)
POTASSIUM: 4.1 mmol/L (ref 3.5–5.1)
Sodium: 137 mmol/L (ref 135–145)
TOTAL PROTEIN: 7 g/dL (ref 6.5–8.1)

## 2016-11-08 LAB — URINALYSIS, ROUTINE W REFLEX MICROSCOPIC
Bilirubin Urine: NEGATIVE
Glucose, UA: NEGATIVE mg/dL
Hgb urine dipstick: NEGATIVE
KETONES UR: NEGATIVE mg/dL
LEUKOCYTES UA: NEGATIVE
NITRITE: NEGATIVE
PH: 6 (ref 5.0–8.0)
Protein, ur: NEGATIVE mg/dL
SPECIFIC GRAVITY, URINE: 1.008 (ref 1.005–1.030)

## 2016-11-08 LAB — POCT PREGNANCY, URINE: PREG TEST UR: POSITIVE — AB

## 2016-11-08 LAB — HCG, QUANTITATIVE, PREGNANCY: HCG, BETA CHAIN, QUANT, S: 200 m[IU]/mL — AB (ref <5)

## 2016-11-08 NOTE — Discharge Instructions (Signed)
Information on ectopic pregnancy is included, so that you will know the signs and symptoms to report back to Maternity Admissions for.

## 2016-11-08 NOTE — MAU Provider Note (Signed)
History     CSN: [id number]  Arrival date and time: 11/08/16 1245   First Provider Initiated Contact with Patient 11/08/16 1345      Chief Complaint  Patient presents with  . Back Pain   HPI  [name] is a 26 yo [id number] at 6.[redacted] wks gestation by LMP presenting to MAU with complaints of abdominal cramping (rated 8/10) and intermittent back pain that started this weekend.  Her LMP was 09/27/2016.  She had a Neg HPT last week and then a (+) HPT this weekend. She denies VB or abnormal vaginal d/c.  Past Medical History:  Diagnosis Date  . Medical history non-contributory   . Normal labor 10/11/2014    Past Surgical History:  Procedure Laterality Date  . NO PAST SURGERIES      Family History  Problem Relation Age of Onset  . Diabetes Mother   . Heart disease Mother     Social History  Substance Use Topics  . Smoking status: Never Smoker  . Smokeless tobacco: Never Used  . Alcohol use No    Allergies: No Known Allergies  Prescriptions Prior to Admission  Medication Sig Dispense Refill Last Dose  . acetaminophen (TYLENOL) 500 MG tablet Take 500 mg by mouth every 6 (six) hours as needed for mild pain or headache.   11/07/2016 at Unknown time  . Prenatal Vit-Fe Fumarate-FA (PRENATAL MULTIVITAMIN) TABS tablet Take 1 tablet by mouth daily.    11/08/2016 at Unknown time  . cephALEXin (KEFLEX) 500 MG capsule Take 1 capsule (500 mg total) by mouth 4 (four) times daily. (Patient not taking: Reported on 11/08/2016) 28 capsule 0 Not Taking at Unknown time    Review of Systems  Constitutional: Negative.   HENT: Negative.   Eyes: Negative.   Respiratory: Negative.   Cardiovascular: Negative.   Gastrointestinal: Positive for abdominal pain, nausea and vomiting (daily).  Endocrine: Negative.   Genitourinary: Negative.   Musculoskeletal: Negative.   Skin: Negative.   Allergic/Immunologic: Negative.   Neurological: Negative.   Hematological: Negative.    Psychiatric/Behavioral: Negative.    Physical Exam   Blood pressure 118/74, pulse 70, temperature 98.3 F (36.8 C), temperature source Oral, resp. rate 18, weight 191 lb (86.6 kg), last menstrual period 09/26/2016, SpO2 100 %, currently breastfeeding.  Physical Exam  Constitutional: She is oriented to person, place, and time. She appears well-developed and well-nourished.  HENT:  Head: Normocephalic.  Eyes: Pupils are equal, round, and reactive to light.  Neck: Normal range of motion.  Cardiovascular: Normal rate, regular rhythm, normal heart sounds and intact distal pulses.   Respiratory: Effort normal and breath sounds normal.  GI: Soft. Bowel sounds are normal.  Genitourinary:  Genitourinary Comments: Uterus: slightly enlarged, cx; smooth, pink, no lesions, scant amt of white d/c, closed/long/firm, no CMT or friability, no adnexal tenderness   Musculoskeletal: Normal range of motion.  Neurological: She is alert and oriented to person, place, and time. She has normal reflexes.  Skin: Skin is warm and dry.  Psychiatric: She has a normal mood and affect. Her behavior is normal. Judgment and thought content normal.    MAU Course  Procedures  MDM CCUA UPT CBCD CMP Wet Prep GC/CT HIV Reviewed A Positive from previous results HCG U/S OB Comp < 14 wks U/S Transvaginal  Results for orders placed or performed during the hospital encounter of 11/08/16 (from the past 24 hour(s))  Urinalysis, Routine w reflex microscopic     Status: Abnormal   Collection  Time: 11/08/16  1:24 PM  Result Value Ref Range   Color, Urine STRAW (A) YELLOW   APPearance CLEAR CLEAR   Specific Gravity, Urine 1.008 1.005 - 1.030   pH 6.0 5.0 - 8.0   Glucose, UA NEGATIVE NEGATIVE mg/dL   Hgb urine dipstick NEGATIVE NEGATIVE   Bilirubin Urine NEGATIVE NEGATIVE   Ketones, ur NEGATIVE NEGATIVE mg/dL   Protein, ur NEGATIVE NEGATIVE mg/dL   Nitrite NEGATIVE NEGATIVE   Leukocytes, UA NEGATIVE NEGATIVE   Pregnancy, urine POC     Status: Abnormal   Collection Time: 11/08/16  1:52 PM  Result Value Ref Range   Preg Test, Ur POSITIVE (A) NEGATIVE  Wet prep, genital     Status: Abnormal   Collection Time: 11/08/16  2:02 PM  Result Value Ref Range   Yeast Wet Prep HPF POC NONE SEEN NONE SEEN   Trich, Wet Prep NONE SEEN NONE SEEN   Clue Cells Wet Prep HPF POC NONE SEEN NONE SEEN   WBC, Wet Prep HPF POC FEW (A) NONE SEEN   Sperm NONE SEEN   CBC with Differential/Platelet     Status: None   Collection Time: 11/08/16  2:09 PM  Result Value Ref Range   WBC 8.6 4.0 - 10.5 K/uL   RBC 4.31 3.87 - 5.11 MIL/uL   Hemoglobin 13.7 12.0 - 15.0 g/dL   HCT 38.7 36.0 - 46.0 %   MCV 89.8 78.0 - 100.0 fL   MCH 31.8 26.0 - 34.0 pg   MCHC 35.4 30.0 - 36.0 g/dL   RDW 12.9 11.5 - 15.5 %   Platelets 212 150 - 400 K/uL   Neutrophils Relative % 65 %   Neutro Abs 5.6 1.7 - 7.7 K/uL   Lymphocytes Relative 31 %   Lymphs Abs 2.6 0.7 - 4.0 K/uL   Monocytes Relative 3 %   Monocytes Absolute 0.2 0.1 - 1.0 K/uL   Eosinophils Relative 1 %   Eosinophils Absolute 0.1 0.0 - 0.7 K/uL   Basophils Relative 0 %   Basophils Absolute 0.0 0.0 - 0.1 K/uL  Comprehensive metabolic panel     Status: Abnormal   Collection Time: 11/08/16  2:09 PM  Result Value Ref Range   Sodium 137 135 - 145 mmol/L   Potassium 4.1 3.5 - 5.1 mmol/L   Chloride 110 101 - 111 mmol/L   CO2 21 (L) 22 - 32 mmol/L   Glucose, Bld 100 (H) 65 - 99 mg/dL   BUN 12 6 - 20 mg/dL   Creatinine, Ser 0.46 0.44 - 1.00 mg/dL   Calcium 8.8 (L) 8.9 - 10.3 mg/dL   Total Protein 7.0 6.5 - 8.1 g/dL   Albumin 4.1 3.5 - 5.0 g/dL   AST 15 15 - 41 U/L   ALT 32 14 - 54 U/L   Alkaline Phosphatase 70 38 - 126 U/L   Total Bilirubin 0.8 0.3 - 1.2 mg/dL   GFR calc non Af Amer >60 >60 mL/min   GFR calc Af Amer >60 >60 mL/min   Anion gap 6 5 - 15  hCG, quantitative, pregnancy     Status: Abnormal   Collection Time: 11/08/16  2:09 PM  Result Value Ref Range   hCG,  Beta Chain, Quant, S 200 (H) <5 mIU/mL   US Ob Comp Less 14 Wks  Result Date: 11/08/2016 CLINICAL DATA:  Pregnant, beta HCG 200, abdominal pain EXAM: OBSTETRIC <14 WK ULTRASOUND TECHNIQUE: Transabdominal ultrasound was performed for evaluation of the gestation as  well as the maternal uterus and adnexal regions. COMPARISON:  None. FINDINGS: Intrauterine gestational sac: None Yolk sac:  Not Visualized. Embryo:  Not Visualized. Subchorionic hemorrhage:  None visualized. Maternal uterus/adnexae: Endometrium measures 17 mm. Right ovary is not discretely visualized due to overlying bowel gas. Left ovary is notable for a probable corpus luteal cyst. No free fluid. IMPRESSION: No IUP is visualized. By definition, in the setting of a positive pregnancy test, this reflects a pregnancy of unknown location. Differential considerations include early normal IUP, abnormal IUP/missed abortion, or nonvisualized ectopic pregnancy. Serial beta HCG is suggested. Consider repeat pelvic ultrasound in 14 days. Electronically Signed   By: Julian Hy M.D.   On: 11/08/2016 17:01   US Ob Transvaginal  Result Date: 11/08/2016 CLINICAL DATA:  Pregnant, beta HCG 200, abdominal pain EXAM: OBSTETRIC <14 WK ULTRASOUND TECHNIQUE: Transabdominal ultrasound was performed for evaluation of the gestation as well as the maternal uterus and adnexal regions. COMPARISON:  None. FINDINGS: Intrauterine gestational sac: None Yolk sac:  Not Visualized. Embryo:  Not Visualized. Subchorionic hemorrhage:  None visualized. Maternal uterus/adnexae: Endometrium measures 17 mm. Right ovary is not discretely visualized due to overlying bowel gas. Left ovary is notable for a probable corpus luteal cyst. No free fluid. IMPRESSION: No IUP is visualized. By definition, in the setting of a positive pregnancy test, this reflects a pregnancy of unknown location. Differential considerations include early normal IUP, abnormal IUP/missed abortion, or nonvisualized  ectopic pregnancy. Serial beta HCG is suggested. Consider repeat pelvic ultrasound in 14 days. Electronically Signed   By: Julian Hy M.D.   On: 11/08/2016 17:01    Assessment and Plan  Pregnancy of unknown anatomic location   - Return for F/U U/S in 2 wks & see [facility] provider after U/S - Instructions on Ectopic Pregnancy given - Advised to return to MAU for worsening sx's or sx's of ectopic pregnancy  Discharge home Patient verbalized an understanding of the plan of care and agrees.   Laury Deep, MSN, CNM 11/08/2016, 2:03 PM

## 2016-11-08 NOTE — MAU Note (Signed)
Started having back pain this weekend Intermittent Cramping Rating pain 8/10  +HPT LMP June 17-21st  Denies vaginal bleeding or discharge.

## 2016-11-08 NOTE — Progress Notes (Signed)
Provider at bs. Wet prep and GC done with pelvic exam

## 2016-11-09 LAB — GC/CHLAMYDIA PROBE AMP (~~LOC~~) NOT AT ARMC
CHLAMYDIA, DNA PROBE: NEGATIVE
NEISSERIA GONORRHEA: NEGATIVE

## 2016-11-09 LAB — HIV ANTIBODY (ROUTINE TESTING W REFLEX): HIV Screen 4th Generation wRfx: NONREACTIVE

## 2016-11-30 ENCOUNTER — Ambulatory Visit (HOSPITAL_COMMUNITY)
Admission: RE | Admit: 2016-11-30 | Discharge: 2016-11-30 | Disposition: A | Discharge status: Home / Self Care | Payer: Medicaid Other | Source: Ambulatory Visit | Attending: Obstetrics and Gynecology | Admitting: Obstetrics and Gynecology

## 2016-11-30 ENCOUNTER — Encounter: Payer: Self-pay | Admitting: Family Medicine

## 2016-11-30 ENCOUNTER — Ambulatory Visit: Payer: Medicaid Other | Admitting: *Deleted

## 2016-11-30 DIAGNOSIS — Z3491 Encounter for supervision of normal pregnancy, unspecified, first trimester: Secondary | ICD-10-CM | POA: Insufficient documentation

## 2016-11-30 DIAGNOSIS — Z3490 Encounter for supervision of normal pregnancy, unspecified, unspecified trimester: Secondary | ICD-10-CM | POA: Diagnosis present

## 2016-11-30 DIAGNOSIS — O3680X0 Pregnancy with inconclusive fetal viability, not applicable or unspecified: Secondary | ICD-10-CM

## 2016-11-30 DIAGNOSIS — Z712 Person consulting for explanation of examination or test findings: Secondary | ICD-10-CM

## 2016-11-30 NOTE — Progress Notes (Addendum)
[place] results reviewed with Dr. Si Raider.  Pt informed of [place] results showing viable pregnancy and EDD 07/15/17  (7w 4d).  She voiced understanding and will schedule prenatal care. Photo from [place] dept given to pt.

## 2017-01-05 ENCOUNTER — Encounter: Payer: Self-pay | Admitting: Medical

## 2017-01-05 ENCOUNTER — Ambulatory Visit (INDEPENDENT_AMBULATORY_CARE_PROVIDER_SITE_OTHER): Payer: Medicaid Other | Admitting: Medical

## 2017-01-05 ENCOUNTER — Encounter: Payer: Self-pay | Admitting: Obstetrics & Gynecology

## 2017-01-05 VITALS — BP 126/73 | HR 71 | Wt 188.3 lb

## 2017-01-05 DIAGNOSIS — Z23 Encounter for immunization: Secondary | ICD-10-CM | POA: Diagnosis not present

## 2017-01-05 DIAGNOSIS — O09211 Supervision of pregnancy with history of pre-term labor, first trimester: Secondary | ICD-10-CM | POA: Diagnosis not present

## 2017-01-05 DIAGNOSIS — Z3480 Encounter for supervision of other normal pregnancy, unspecified trimester: Secondary | ICD-10-CM

## 2017-01-05 NOTE — Patient Instructions (Signed)
First Trimester of Pregnancy The first trimester of pregnancy is from week 1 until the end of week 13 (months 1 through 3). During this time, your baby will begin to develop inside you. At 6-8 weeks, the eyes and face are formed, and the heartbeat can be seen on ultrasound. At the end of 12 weeks, all the baby's organs are formed. Prenatal care is all the medical care you receive before the birth of your baby. Make sure you get good prenatal care and follow all of your doctor's instructions. Follow these instructions at home: Medicines  Take over-the-counter and prescription medicines only as told by your doctor. Some medicines are safe and some medicines are not safe during pregnancy.  Take a prenatal vitamin that contains at least 600 micrograms (mcg) of folic acid.  If you have trouble pooping (constipation), take medicine that will make your stool soft (stool softener) if your doctor approves. Eating and drinking  Eat regular, healthy meals.  Your doctor will tell you the amount of weight gain that is right for you.  Avoid raw meat and uncooked cheese.  If you feel sick to your stomach (nauseous) or throw up (vomit): ? Eat 4 or 5 small meals a day instead of 3 large meals. ? Try eating a few soda crackers. ? Drink liquids between meals instead of during meals.  To prevent constipation: ? Eat foods that are high in fiber, like fresh fruits and vegetables, whole grains, and beans. ? Drink enough fluids to keep your pee (urine) clear or pale yellow. Activity  Exercise only as told by your doctor. Stop exercising if you have cramps or pain in your lower belly (abdomen) or low back.  Do not exercise if it is too hot, too humid, or if you are in a place of great height (high altitude).  Try to avoid standing for long periods of time. Move your legs often if you must stand in one place for a long time.  Avoid heavy lifting.  Wear low-heeled shoes. Sit and stand up straight.  You  can have sex unless your doctor tells you not to. Relieving pain and discomfort  Wear a good support bra if your breasts are sore.  Take warm water baths (sitz baths) to soothe pain or discomfort caused by hemorrhoids. Use hemorrhoid cream if your doctor says it is okay.  Rest with your legs raised if you have leg cramps or low back pain.  If you have puffy, bulging veins (varicose veins) in your legs: ? Wear support hose or compression stockings as told by your doctor. ? Raise (elevate) your feet for 15 minutes, 3-4 times a day. ? Limit salt in your food. Prenatal care  Schedule your prenatal visits by the twelfth week of pregnancy.  Write down your questions. Take them to your prenatal visits.  Keep all your prenatal visits as told by your doctor. This is important. Safety  Wear your seat belt at all times when driving.  Make a list of emergency phone numbers. The list should include numbers for family, friends, the hospital, and police and fire departments. General instructions  Ask your doctor for a referral to a local prenatal class. Begin classes no later than at the start of month 6 of your pregnancy.  Ask for help if you need counseling or if you need help with nutrition. Your doctor can give you advice or tell you where to go for help.  Do not use hot tubs, steam rooms, or   saunas.  Do not douche or use tampons or scented sanitary pads.  Do not cross your legs for long periods of time.  Avoid all herbs and alcohol. Avoid drugs that are not approved by your doctor.  Do not use any tobacco products, including cigarettes, chewing tobacco, and electronic cigarettes. If you need help quitting, ask your doctor. You may get counseling or other support to help you quit.  Avoid cat litter boxes and soil used by cats. These carry germs that can cause birth defects in the baby and can cause a loss of your baby (miscarriage) or stillbirth.  Visit your dentist. At home, brush  your teeth with a soft toothbrush. Be gentle when you floss. Contact a doctor if:  You are dizzy.  You have mild cramps or pressure in your lower belly.  You have a nagging pain in your belly area.  You continue to feel sick to your stomach, you throw up, or you have watery poop (diarrhea).  You have a bad smelling fluid coming from your vagina.  You have pain when you pee (urinate).  You have increased puffiness (swelling) in your face, hands, legs, or ankles. Get help right away if:  You have a fever.  You are leaking fluid from your vagina.  You have spotting or bleeding from your vagina.  You have very bad belly cramping or pain.  You gain or lose weight rapidly.  You throw up blood. It may look like coffee grounds.  You are around people who have German measles, fifth disease, or chickenpox.  You have a very bad headache.  You have shortness of breath.  You have any kind of trauma, such as from a fall or a car accident. Summary  The first trimester of pregnancy is from week 1 until the end of week 13 (months 1 through 3).  To take care of yourself and your unborn baby, you will need to eat healthy meals, take medicines only if your doctor tells you to do so, and do activities that are safe for you and your baby.  Keep all follow-up visits as told by your doctor. This is important as your doctor will have to ensure that your baby is healthy and growing well. This information is not intended to replace advice given to you by your health care provider. Make sure you discuss any questions you have with your health care provider. Document Released: 09/15/2007 Document Revised: 04/06/2016 Document Reviewed: 04/06/2016 Elsevier Interactive Patient Education  2017 [employer]  

## 2017-01-05 NOTE — Progress Notes (Signed)
   PRENATAL VISIT NOTE  Subjective:  [name] is a 26 y.o. [phone] at [redacted]w[redacted]d being seen today for initial prenatal visit.  She is currently monitored for the following issues for this high-risk pregnancy and has H/O rapid labor; History of preterm labor, current pregnancy, first trimester; and Supervision of other normal pregnancy, antepartum on her problem list.  Patient reports no complaints.  Contractions: Not present. Vag. Bleeding: None.  Movement: Present. Denies leaking of fluid.   The following portions of the patient's history were reviewed and updated as appropriate: allergies, current medications, past family history, past medical history, past social history, past surgical history and problem list. Problem list updated.  Objective:   Vitals:   01/05/17 0923  BP: 126/73  Pulse: 71  Weight: 188 lb 4.8 oz (85.4 kg)    Fetal Status: Fetal Heart Rate (bpm): 158   Movement: Present     General:  Alert, oriented and cooperative. Patient is in no acute distress.  Skin: Skin is warm and dry. No rash noted.   Cardiovascular: Normal heart rate noted  Respiratory: Normal respiratory effort, no problems with respiration noted  Abdomen: Soft, gravid, appropriate for gestational age.  Pain/Pressure: Absent     Pelvic: Cervical exam performed Dilation: Closed Effacement (%): Thick    Extremities: Normal range of motion.  Edema: None  Mental Status:  Normal mood and affect. Normal behavior. Normal judgment and thought content.   Assessment and Plan:  Pregnancy: [id number] at [redacted]w[redacted]d  1. Supervision of other normal pregnancy, antepartum - Obstetric Panel, Including HIV - [place] MFM OB COMP + 14 WK; scheduled - Hemoglobin A1c  2. Needs flu shot - Flu Vaccine QUAD 36+ mos IM (Fluarix, Quad PF)  3. History of preterm labor, current pregnancy, first trimester - Declines 17-P - Delivered last child at [redacted]w[redacted]d, states was "on bedrest" at 2 separate times during the  pregnancy  First trimester warning symptoms and general obstetric precautions including but not limited to vaginal bleeding, contractions, leaking of fluid and fetal movement were reviewed in detail with the patient. Please refer to After Visit Summary for other counseling recommendations.  Return in about 4 weeks (around 02/02/2017) for LOB.   Kerry Hough, PA-C

## 2017-01-06 LAB — OBSTETRIC PANEL, INCLUDING HIV
Antibody Screen: NEGATIVE
BASOS ABS: 0 10*3/uL (ref 0.0–0.2)
Basos: 0 %
EOS (ABSOLUTE): 0.1 10*3/uL (ref 0.0–0.4)
Eos: 1 %
HEMATOCRIT: 40.1 % (ref 34.0–46.6)
HEP B S AG: NEGATIVE
HIV SCREEN 4TH GENERATION: NONREACTIVE
Hemoglobin: 13.5 g/dL (ref 11.1–15.9)
Immature Grans (Abs): 0 10*3/uL (ref 0.0–0.1)
Immature Granulocytes: 0 %
Lymphocytes Absolute: 1.7 10*3/uL (ref 0.7–3.1)
Lymphs: 24 %
MCH: 32.1 pg (ref 26.6–33.0)
MCHC: 33.7 g/dL (ref 31.5–35.7)
MCV: 95 fL (ref 79–97)
Monocytes Absolute: 0.3 10*3/uL (ref 0.1–0.9)
Monocytes: 4 %
NEUTROS ABS: 4.9 10*3/uL (ref 1.4–7.0)
Neutrophils: 71 %
PLATELETS: 201 10*3/uL (ref 150–379)
RBC: 4.21 x10E6/uL (ref 3.77–5.28)
RDW: 13.7 % (ref 12.3–15.4)
RPR: NONREACTIVE
Rh Factor: POSITIVE
Rubella Antibodies, IGG: <0.9 index — ABNORMAL LOW (ref >0.99)
WBC: 6.9 10*3/uL (ref 3.4–10.8)

## 2017-01-06 LAB — HEMOGLOBIN A1C
ESTIMATED AVERAGE GLUCOSE: 103 mg/dL
Hgb A1c MFr Bld: 5.2 % (ref 4.8–5.6)

## 2017-01-13 LAB — OB RESULTS CONSOLE GC/CHLAMYDIA
Chlamydia: NEGATIVE
GC PROBE AMP, GENITAL: NEGATIVE

## 2017-01-13 LAB — OB RESULTS CONSOLE RUBELLA ANTIBODY, IGM: Rubella: NON-IMMUNE/NOT IMMUNE

## 2017-01-13 LAB — OB RESULTS CONSOLE HEPATITIS B SURFACE ANTIGEN: HEP B S AG: NEGATIVE

## 2017-01-13 LAB — OB RESULTS CONSOLE RPR: RPR: NONREACTIVE

## 2017-01-13 LAB — OB RESULTS CONSOLE HIV ANTIBODY (ROUTINE TESTING): HIV: NONREACTIVE

## 2017-01-28 ENCOUNTER — Encounter: Payer: Self-pay | Admitting: *Deleted

## 2017-02-02 ENCOUNTER — Encounter: Payer: Medicaid Other | Admitting: Obstetrics & Gynecology

## 2017-02-02 ENCOUNTER — Telehealth: Payer: Self-pay | Admitting: Obstetrics & Gynecology

## 2017-02-02 NOTE — Telephone Encounter (Signed)
Called patient about missing her appointment. She stated she was going back to her pervious provider.

## 2017-02-07 ENCOUNTER — Encounter: Payer: Medicaid Other | Admitting: Advanced Practice Midwife

## 2017-02-23 ENCOUNTER — Other Ambulatory Visit (HOSPITAL_COMMUNITY): Payer: Medicaid Other

## 2017-04-12 NOTE — L&D Delivery Note (Addendum)
Delivery Note At 1:13 PM a viable female, "[name]", was delivered via Vaginal, Spontaneous (Presentation: LOA).  APGAR: 8, 9; weight  TBA. Placenta status: Intact, spontaneous.  Cord:  with the following complications: None.  Cord pH: NA  Anesthesia:  Epidural Episiotomy: None Lacerations:  Small periclitoral, no repair required due to spontaneous hemostasis Suture Repair: NA Est. Blood Loss (mL): 100  Mom to postpartum.  Baby to Couplet care / Skin to Skin. Patient declines circumcision.  Undecided regarding contraception. Placenta to path due to cholestasis. Continue Vistaril prn for itching. Check fasting CBG in am.  Donnel Saxon 06/25/2017, 1:32 PM

## 2017-05-09 ENCOUNTER — Inpatient Hospital Stay (HOSPITAL_COMMUNITY)
Admission: AD | Admit: 2017-05-09 | Discharge: 2017-05-10 | Disposition: A | Discharge status: Home / Self Care | Payer: Medicaid Other | Source: Ambulatory Visit | Attending: Obstetrics & Gynecology | Admitting: Obstetrics & Gynecology

## 2017-05-09 ENCOUNTER — Other Ambulatory Visit: Payer: Self-pay

## 2017-05-09 ENCOUNTER — Encounter (HOSPITAL_COMMUNITY): Payer: Self-pay | Admitting: *Deleted

## 2017-05-09 DIAGNOSIS — O99513 Diseases of the respiratory system complicating pregnancy, third trimester: Secondary | ICD-10-CM | POA: Insufficient documentation

## 2017-05-09 DIAGNOSIS — J111 Influenza due to unidentified influenza virus with other respiratory manifestations: Secondary | ICD-10-CM | POA: Diagnosis not present

## 2017-05-09 DIAGNOSIS — J329 Chronic sinusitis, unspecified: Secondary | ICD-10-CM | POA: Diagnosis not present

## 2017-05-09 DIAGNOSIS — Z3480 Encounter for supervision of other normal pregnancy, unspecified trimester: Secondary | ICD-10-CM

## 2017-05-09 DIAGNOSIS — Z3A30 30 weeks gestation of pregnancy: Secondary | ICD-10-CM | POA: Diagnosis not present

## 2017-05-09 DIAGNOSIS — J101 Influenza due to other identified influenza virus with other respiratory manifestations: Secondary | ICD-10-CM

## 2017-05-09 DIAGNOSIS — R05 Cough: Secondary | ICD-10-CM | POA: Diagnosis present

## 2017-05-09 HISTORY — DX: Headache: R51

## 2017-05-09 HISTORY — DX: Gastro-esophageal reflux disease without esophagitis: K21.9

## 2017-05-09 HISTORY — DX: Headache, unspecified: R51.9

## 2017-05-09 HISTORY — DX: Benign neoplasm of connective and other soft tissue, unspecified: D21.9

## 2017-05-09 LAB — URINALYSIS, ROUTINE W REFLEX MICROSCOPIC
BILIRUBIN URINE: NEGATIVE
GLUCOSE, UA: NEGATIVE mg/dL
HGB URINE DIPSTICK: NEGATIVE
KETONES UR: NEGATIVE mg/dL
NITRITE: NEGATIVE
PH: 8 (ref 5.0–8.0)
Protein, ur: NEGATIVE mg/dL
Specific Gravity, Urine: 1.009 (ref 1.005–1.030)

## 2017-05-09 MED ORDER — PSEUDOEPHEDRINE HCL 30 MG PO TABS
60.0000 mg | ORAL_TABLET | Freq: Once | ORAL | Status: AC
  Administered 2017-05-10: 60 mg via ORAL
  Filled 2017-05-09: qty 2

## 2017-05-09 NOTE — MAU Provider Note (Signed)
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  Patient reports that symptoms started on Tuesday and have been gradually worsening.  Patient states that she took children's robitussin which improved her cough.  Patient states she had fever of 100.5 to 101.0 which improved with one tylenol pill.  Patient reports that she has allergies usually which is relieved with zyrtec, but patient has been taking claritin with minimal relief.  Patient also states she has a headache with a focus in the frontal area above the nose.  Patient also states she has been contracting for the last 2 weeks and was checked on Tuesday with negative exam including fFN.  +FM, -LoF, -VB.  Patient Active Problem List   Diagnosis Date Noted  . Supervision of other normal pregnancy, antepartum 01/05/2017  . History of preterm labor, current pregnancy, first trimester 11/08/2016  . H/O rapid labor 05/22/2014    No chief complaint on file.  HPI  OB History    Gravida Para Term Preterm AB Living   5 3 2 1 1 3    SAB TAB Ectopic Multiple Live Births   1     0 3      Past Medical History:  Diagnosis Date  . Fibroid   . GERD (gastroesophageal reflux disease)   . Headache   . Medical history non-contributory   . Normal labor 10/11/2014    Past Surgical History:  Procedure Laterality Date  . NO PAST SURGERIES      Family History  Problem Relation Age of Onset  . Diabetes Mother   . Heart disease Mother     Social History   Tobacco Use  . Smoking status: Never Smoker  . Smokeless tobacco: Never Used  Substance Use Topics  . Alcohol use: No  . Drug use: No    Allergies: No Known Allergies  Medications Prior to Admission  Medication Sig Dispense Refill Last Dose  . acetaminophen (TYLENOL) 500 MG tablet Take 500 mg by mouth every 6 (six) hours as needed for mild pain or headache.   05/09/2017 at 1800  . Prenatal Vit-Fe Fumarate-FA (PRENATAL MULTIVITAMIN)  TABS tablet Take 1 tablet by mouth daily.    05/09/2017 at Unknown time    Review of Systems  Constitutional: Positive for chills and fever.  HENT: Positive for congestion and sinus pain. Negative for sore throat.   Respiratory: Positive for cough. Negative for sputum production and shortness of breath.   Cardiovascular: Positive for chest pain ("from congestion and coughing").  Gastrointestinal: Positive for nausea. Negative for vomiting.  Musculoskeletal: Positive for back pain ("near lungs"). Negative for neck pain.    See HPI Above Physical Exam   Blood pressure 136/79, pulse (!) 115, temperature 99.1 F (37.3 C), temperature source Oral, resp. rate 20, last menstrual period 09/26/2016, SpO2 99 %, currently breastfeeding.  Results for orders placed or performed during the hospital encounter of 05/09/17 (from the past 24 hour(s))  Urinalysis, Routine w reflex microscopic     Status: Abnormal   Collection Time: 05/09/17 10:40 PM  Result Value Ref Range   Color, Urine YELLOW YELLOW   APPearance CLEAR CLEAR   Specific Gravity, Urine 1.009 1.005 - 1.030   pH 8.0 5.0 - 8.0   Glucose, UA NEGATIVE NEGATIVE mg/dL   Hgb urine dipstick NEGATIVE NEGATIVE   Bilirubin Urine NEGATIVE NEGATIVE   Ketones, ur NEGATIVE NEGATIVE mg/dL   Protein, ur NEGATIVE NEGATIVE mg/dL  Nitrite NEGATIVE NEGATIVE   Leukocytes, UA TRACE (A) NEGATIVE   RBC / HPF 0-5 0 - 5 RBC/hpf   WBC, UA 0-5 0 - 5 WBC/hpf   Bacteria, UA RARE (A) NONE SEEN   Squamous Epithelial / LPF 0-5 (A) NONE SEEN    Physical Exam  Vitals reviewed. Constitutional: She is oriented to person, place, and time. She appears well-developed and well-nourished. No distress.  HENT:  Head: Normocephalic and atraumatic.  Eyes: Conjunctivae are normal.  Neck: Normal range of motion.  Cardiovascular: Normal rate, regular rhythm and normal heart sounds.  Respiratory: Effort normal and breath sounds normal.  GI: Soft. Bowel sounds are normal.   Musculoskeletal: Normal range of motion.  Neurological: She is alert and oriented to person, place, and time.  Skin: Skin is warm and dry.  Psychiatric: She has a normal mood and affect. Her behavior is normal.     FHR:155 bpm, Mod Var, -Decels, +Accels UC: Occasional ED Course  Assessment: IUP at 30.4wks  Cat I FT Sinus Infection vs Influenza   Plan: -Labs; UA, INfluenza Panel -Sudafed 60mg  PO -Tylenol 1000mg  PO at 0000 since last dose was 1800 -Discussed management of symptoms -Discussed continued monitoring of contractions -Will defer pelvic exam, at current, but will consider bimanual exam if contractions continue  -Okay to discontinue FHT, but maintain tocometry -Await labs  Follow Up (0130) -Influenza still pending -No contractions graphed since 2330 -Will continue to monitor contractions -Await labs  Follow Up (0210) -Influenza A positive -Discussed management -Tamiflu Rx sent to pharmacy -Encouraged rest -Patient to report to office for [facility], but otherwise to remain at home -Masks provided for office visit tomorrow -Will reschedule 3hr GTT that is scheduled on Wednesday Jan 30th -Message sent to [facility] triage via [place] [facility] -No q/c -Reports improvement with tylenol and sudafed dosing -Given information for sudafed dosing and usage -Encouraged to call if any questions or concerns arise prior to next scheduled office visit.  -Discharged to home in stable condition  Maryann Conners CNM, MSN 05/09/2017 11:22 PM

## 2017-05-09 NOTE — MAU Note (Addendum)
Pt reports fevers today. Highest at 105 in her ear. Took Tylenol 1 pill at noon and one at 5pm. Pt has a cough, but no sore throat.She has congestion and headache. Pt was vomiting on Saturday. No nausea or vomiting today. + FM, but not as much as usual. Feeling lower abdominal pressure, especially when coughing. On Makena for a hx of PTL

## 2017-05-10 LAB — INFLUENZA PANEL BY PCR (TYPE A & B)
INFLBPCR: NEGATIVE
Influenza A By PCR: POSITIVE — AB

## 2017-05-10 MED ORDER — ACETAMINOPHEN 500 MG PO TABS
1000.0000 mg | ORAL_TABLET | Freq: Once | ORAL | Status: AC
  Administered 2017-05-10: 1000 mg via ORAL
  Filled 2017-05-10: qty 2

## 2017-05-10 MED ORDER — OSELTAMIVIR PHOSPHATE 75 MG PO CAPS: 75.0000 mg | ORAL_CAPSULE | Freq: Two times a day (BID) | ORAL | 0 refills | Status: AC

## 2017-05-10 NOTE — Discharge Instructions (Signed)
Pseudoephedrine tablets What is this medicine? PSEUDOEPHEDRINE (soo doe e FED rin) is a decongestant. It is used to treat congestion of the nose or sinuses. This medicine may be used for other purposes; ask your health care provider or pharmacist if you have questions. COMMON BRAND NAME(S): Contac Cold 12 Hour, Genaphed, NASAL Decongestant, Nexafed, Pseudo-Time, Sudafed, Sudafed Congestion, Sudogest, Zephrex-D What should I tell my health care provider before I take this medicine? They need to know if you have any of the following conditions: -diabetes -glaucoma -heart disease -high blood pressure -kidney disease -prostate trouble -taken an MAOI like Carbex, Eldepryl, Marplan, Nardil, or Parnate in last 14 days -thyroid disease -trouble passing urine -an unusual or allergic reaction to pseudoephedrine, other medicines, foods, dyes, or preservatives -pregnant or trying to get pregnant -breast-feeding How should I use this medicine? Take this medicine by mouth with a glass of water. Follow the directions on the package or prescription label. Take your medicine at regular intervals. Do not take your medicine more often than directed. Talk to your pediatrician regarding the use of this medicine in children. While this drug may be prescribed for children as young as 51 years of age for selected conditions, precautions do apply. Patients over 36 years old may have a stronger reaction and need a smaller dose. Overdosage: If you think you have taken too much of this medicine contact a poison control center or emergency room at once. NOTE: This medicine is only for you. Do not share this medicine with others. What if I miss a dose? If you miss a dose, take it as soon as you can. If it is almost time for your next dose, take only that dose. Do not take double or extra doses. What may interact with this medicine? Do not take this medicine with any of the following medications: -bromocriptine -ergot  alkaloids like dihydroergotamine, ergonovine, ergotamine, methylergonovine -MAOIs like Carbex, Eldepryl, Marplan, Nardil, and Parnate -stimulant medicines for attention disorders, weight loss, or to stay awake This medicine may also interact with the following medications: -alcohol -atropine -bretylium -caffeine -digoxin -linezolid -mecamylamine -medicines for blood pressure -medicines for depression, anxiety, or psychotic disturbances like fluoxetine, sertraline -medicines for enlarged prostate -medicines for sleep -other medicines for cold, cough, or allergy -procarbazine -reserpine -some heart medicines like metoprolol -St. John's Wort This list may not describe all possible interactions. Give your health care provider a list of all the medicines, herbs, non-prescription drugs, or dietary supplements you use. Also tell them if you smoke, drink alcohol, or use illegal drugs. Some items may interact with your medicine. What should I watch for while using this medicine? Tell your doctor or healthcare professional if your symptoms do not start to get better or if they get worse. See your doctor if you are not better in 7 days or if you have a fever. What side effects may I notice from receiving this medicine? Side effects that you should report to your doctor or health care professional as soon as possible: -allergic reactions like skin rash, itching or hives, swelling of the face, lips, or tongue -bloody diarrhea with stomach pain -breathing problems -chest pain -confused, agitated, nervous -fast, irregular heartbeat -feeling faint or lightheaded, falls -hallucinations -high blood pressure -pain, tingling, numbness in the hands or feet -trouble passing urine or change in the amount of urine -trouble sleeping Side effects that usually do not require medical attention (report to your doctor or health care professional if they continue or are bothersome): -headache -  loss of  appetite -nausea, stomach upset This list may not describe all possible side effects. Call your doctor for medical advice about side effects. You may report side effects to FDA at 1-800-FDA-1088. Where should I keep my medicine? Keep out of the reach of children. This medicine may cause accidental overdose and death if taken by other adults, children, or pets. Mix any unused medicine with a substance like cat littler or coffee grounds. Then throw the medicine away in a sealed container like a sealed bag or a coffee can with a lid. Do not use the medicine after the expiration date. Store at room temperature between 15 and 25 degrees C (59 and 77 degrees F). Protect from heat and moisture. NOTE: This sheet is a summary. It may not cover all possible information. If you have questions about this medicine, talk to your doctor, pharmacist, or health care provider.  2018 Elsevier/Gold Standard (2013-12-01 19:28:08) Influenza, Adult Influenza, more commonly known as the flu, is a viral infection that primarily affects the respiratory tract. The respiratory tract includes organs that help you breathe, such as the lungs, nose, and throat. The flu causes many common cold symptoms, as well as a high fever and body aches. The flu spreads easily from person to person (is contagious). Getting a flu shot (influenza vaccination) every year is the best way to prevent influenza. What are the causes? Influenza is caused by a virus. You can catch the virus by:  Breathing in droplets from an infected person's cough or sneeze.  Touching something that was recently contaminated with the virus and then touching your mouth, nose, or eyes.  What increases the risk? The following factors may make you more likely to get the flu:  Not cleaning your hands frequently with soap and water or alcohol-based hand sanitizer.  Having close contact with many people during cold and flu season.  Touching your mouth, eyes, or  nose without washing or sanitizing your hands first.  Not drinking enough fluids or not eating a healthy diet.  Not getting enough sleep or exercise.  Being under a high amount of stress.  Not getting a yearly (annual) flu shot.  You may be at a higher risk of complications from the flu, such as a severe lung infection (pneumonia), if you:  Are over the age of 35.  Are pregnant.  Have a weakened disease-fighting system (immune system). You may have a weakened immune system if you: ? Have HIV or AIDS. ? Are undergoing chemotherapy. ? Aretaking medicines that reduce the activity of (suppress) the immune system.  Have a long-term (chronic) illness, such as heart disease, kidney disease, diabetes, or lung disease.  Have a liver disorder.  Are obese.  Have anemia.  What are the signs or symptoms? Symptoms of this condition typically last 4-10 days and may include:  Fever.  Chills.  Headache, body aches, or muscle aches.  Sore throat.  Cough.  Runny or congested nose.  Chest discomfort and cough.  Poor appetite.  Weakness or tiredness (fatigue).  Dizziness.  Nausea or vomiting.  How is this diagnosed? This condition may be diagnosed based on your medical history and a physical exam. Your health care provider may do a nose or throat swab test to confirm the diagnosis. How is this treated? If influenza is detected early, you can be treated with antiviral medicine that can reduce the length of your illness and the severity of your symptoms. This medicine may be given by mouth (  orally) or through an IV tube that is inserted in one of your veins. The goal of treatment is to relieve symptoms by taking care of yourself at home. This may include taking over-the-counter medicines, drinking plenty of fluids, and adding humidity to the air in your home. In some cases, influenza goes away on its own. Severe influenza or complications from influenza may be treated in a  hospital. Follow these instructions at home:  Take over-the-counter and prescription medicines only as told by your health care provider.  Use a cool mist humidifier to add humidity to the air in your home. This can make breathing easier.  Rest as needed.  Drink enough fluid to keep your urine clear or pale yellow.  Cover your mouth and nose when you cough or sneeze.  Wash your hands with soap and water often, especially after you cough or sneeze. If soap and water are not available, use hand sanitizer.  Stay home from work or school as told by your health care provider. Unless you are visiting your health care provider, try to avoid leaving home until your fever has been gone for 24 hours without the use of medicine.  Keep all follow-up visits as told by your health care provider. This is important. How is this prevented?  Getting an annual flu shot is the best way to avoid getting the flu. You may get the flu shot in late summer, fall, or winter. Ask your health care provider when you should get your flu shot.  Wash your hands often or use hand sanitizer often.  Avoid contact with people who are sick during cold and flu season.  Eat a healthy diet, drink plenty of fluids, get enough sleep, and exercise regularly. Contact a health care provider if:  You develop new symptoms.  You have: ? Chest pain. ? Diarrhea. ? A fever.  Your cough gets worse.  You produce more mucus.  You feel nauseous or you vomit. Get help right away if:  You develop shortness of breath or difficulty breathing.  Your skin or nails turn a bluish color.  You have severe pain or stiffness in your neck.  You develop a sudden headache or sudden pain in your face or ear.  You cannot stop vomiting. This information is not intended to replace advice given to you by your health care provider. Make sure you discuss any questions you have with your health care provider. Document Released: 03/26/2000  Document Revised: 09/04/2015 Document Reviewed: 01/21/2015 Elsevier Interactive Patient Education  2017 [employer].

## 2017-05-25 ENCOUNTER — Encounter: Payer: Medicaid Other | Attending: Obstetrics and Gynecology | Admitting: Registered"

## 2017-05-25 DIAGNOSIS — Z713 Dietary counseling and surveillance: Secondary | ICD-10-CM | POA: Diagnosis present

## 2017-05-25 DIAGNOSIS — O99810 Abnormal glucose complicating pregnancy: Secondary | ICD-10-CM | POA: Diagnosis present

## 2017-05-25 DIAGNOSIS — R7309 Other abnormal glucose: Secondary | ICD-10-CM

## 2017-05-26 ENCOUNTER — Encounter: Payer: Self-pay | Admitting: Registered"

## 2017-05-26 DIAGNOSIS — R7309 Other abnormal glucose: Secondary | ICD-10-CM | POA: Insufficient documentation

## 2017-05-26 NOTE — Progress Notes (Signed)
Patient was seen on 05/25/2017 for Gestational Diabetes self-management class at the [facility]. The following learning objectives were met by the patient during this course:   States the definition of Gestational Diabetes  States why dietary management is important in controlling blood glucose  Describes the effects each nutrient has on blood glucose levels  Demonstrates ability to create a balanced meal plan  Demonstrates carbohydrate counting   States when to check blood glucose levels  Demonstrates proper blood glucose monitoring techniques  States the effect of stress and exercise on blood glucose levels  States the importance of limiting caffeine and abstaining from alcohol and smoking  Blood glucose monitor given: Accu-Chek Guide Lot # [id number] Exp: 06/15/18 Blood glucose reading: 100  Patient instructed to monitor glucose levels: FBS: 60 - <95; 1 hour: <140; 2 hour: <120  Patient received handouts:  Nutrition Diabetes and Pregnancy, including carb counting list  Patient will be seen for follow-up as needed.

## 2017-06-08 LAB — OB RESULTS CONSOLE GBS: GBS: NEGATIVE

## 2017-06-15 ENCOUNTER — Encounter (HOSPITAL_COMMUNITY): Payer: Self-pay

## 2017-06-21 ENCOUNTER — Encounter (HOSPITAL_COMMUNITY): Payer: Self-pay | Admitting: *Deleted

## 2017-06-21 ENCOUNTER — Inpatient Hospital Stay (HOSPITAL_COMMUNITY)
Admission: AD | Admit: 2017-06-21 | Discharge: 2017-06-21 | Disposition: A | Discharge status: Home / Self Care | Payer: Medicaid Other | Source: Ambulatory Visit | Attending: Obstetrics and Gynecology | Admitting: Obstetrics and Gynecology

## 2017-06-21 DIAGNOSIS — O26893 Other specified pregnancy related conditions, third trimester: Secondary | ICD-10-CM | POA: Insufficient documentation

## 2017-06-21 DIAGNOSIS — R102 Pelvic and perineal pain: Secondary | ICD-10-CM | POA: Insufficient documentation

## 2017-06-21 DIAGNOSIS — R200 Anesthesia of skin: Secondary | ICD-10-CM | POA: Insufficient documentation

## 2017-06-21 DIAGNOSIS — N898 Other specified noninflammatory disorders of vagina: Secondary | ICD-10-CM

## 2017-06-21 DIAGNOSIS — Z3A36 36 weeks gestation of pregnancy: Secondary | ICD-10-CM | POA: Insufficient documentation

## 2017-06-21 DIAGNOSIS — Z3480 Encounter for supervision of other normal pregnancy, unspecified trimester: Secondary | ICD-10-CM

## 2017-06-21 LAB — WET PREP, GENITAL
CLUE CELLS WET PREP: NONE SEEN
SPERM: NONE SEEN
TRICH WET PREP: NONE SEEN
Yeast Wet Prep HPF POC: NONE SEEN

## 2017-06-21 LAB — URINALYSIS, ROUTINE W REFLEX MICROSCOPIC
BILIRUBIN URINE: NEGATIVE
Glucose, UA: NEGATIVE mg/dL
Hgb urine dipstick: NEGATIVE
Ketones, ur: NEGATIVE mg/dL
Leukocytes, UA: NEGATIVE
NITRITE: NEGATIVE
Protein, ur: NEGATIVE mg/dL
SPECIFIC GRAVITY, URINE: 1.002 — AB (ref 1.005–1.030)
pH: 6 (ref 5.0–8.0)

## 2017-06-21 LAB — POCT FERN TEST: POCT FERN TEST: NEGATIVE

## 2017-06-21 NOTE — Progress Notes (Signed)
PT given information about cholestasis in Spanish. She indicated understanding of all discharge instructions.

## 2017-06-21 NOTE — MAU Provider Note (Addendum)
Patient [name] is a 27 y.o.  [phone] At [redacted]w[redacted]d here with complaints of abdominal pain and vaginal numbness. She was seen in the office yesterday and discussed these concerns with her provider. She also has questions abotu her cholestatis diagnosis.  She denies bleeding or decreased fetal movement; endorses some white discharge. No N/V, dysuria or other ob-gyn complaints.  History     CSN: [id number]  Arrival date and time: 06/21/17 1613   First Provider Initiated Contact with Patient 06/21/17 1749      Chief Complaint  Patient presents with  . Pelvic Pain  . Numbness    vaginal   Abdominal Pain  This is a new problem. The current episode started in the past 7 days. The problem occurs intermittently. The problem has been unchanged. The pain is located in the generalized abdominal region. The pain is at a severity of 6/10. The quality of the pain is cramping. The abdominal pain does not radiate. Nothing aggravates the pain. The pain is relieved by nothing. She has tried nothing for the symptoms.    OB History    Gravida Para Term Preterm AB Living   5 3 2 1 1 3    SAB TAB Ectopic Multiple Live Births   1     0 3      Past Medical History:  Diagnosis Date  . Fibroid   . GERD (gastroesophageal reflux disease)   . Headache   . Medical history non-contributory   . Normal labor 10/11/2014    Past Surgical History:  Procedure Laterality Date  . NO PAST SURGERIES      Family History  Problem Relation Age of Onset  . Diabetes Mother   . Heart disease Mother     Social History   Tobacco Use  . Smoking status: Never Smoker  . Smokeless tobacco: Never Used  Substance Use Topics  . Alcohol use: No  . Drug use: No    Allergies: No Known Allergies  Medications Prior to Admission  Medication Sig Dispense Refill Last Dose  . acetaminophen (TYLENOL) 500 MG tablet Take 500 mg by mouth every 6 (six) hours as needed for mild pain or headache.   06/20/2017 at  Unknown time  . hydrocortisone 2.5 % lotion Apply 1 application topically daily as needed (itching).    06/21/2017 at Unknown time  . HYDROXYprogesterone Caproate (MAKENA) 275 MG/1.1ML SOAJ Inject 1 Dose into the skin once a week.   06/20/2017 at Unknown time  . Prenatal Vit-Fe Fumarate-FA (PRENATAL MULTIVITAMIN) TABS tablet Take 1 tablet by mouth daily.    06/21/2017 at Unknown time  . ursodiol (ACTIGALL) 250 MG tablet Take 250 mg by mouth daily.   06/21/2017 at Unknown time    Review of Systems  Constitutional: Negative.   HENT: Negative.   Respiratory: Negative.   Cardiovascular: Negative.   Gastrointestinal: Positive for abdominal pain.  Genitourinary: Negative.  Negative for vaginal bleeding, vaginal discharge and vaginal pain.  Musculoskeletal: Negative.   Neurological: Negative.   Hematological: Negative.   Psychiatric/Behavioral: Negative.    Physical Exam   Blood pressure 124/75, pulse 79, temperature 97.6 F (36.4 C), temperature source Oral, resp. rate 18, weight 192 lb 12 oz (87.4 kg), last menstrual period 09/26/2016, SpO2 100 %, currently breastfeeding.  Physical Exam  Constitutional: She is oriented to person, place, and time. She appears well-developed.  HENT:  Head: Normocephalic.  Neck: Normal range of motion.  Respiratory: Effort normal.  GI: Soft.  Musculoskeletal: Normal  range of motion.  Neurological: She is alert and oriented to person, place, and time.  Skin: Skin is warm and dry.  Psychiatric: She has a normal mood and affect.  External os is FT; internal os is closed. Cervix is long and posterior.   MAU Course  Procedures  MDM -sterile fern negative, UA neg, wet prep neg -GC Ct pending -No pooling in the vagina -NST: 130 bpm, mod var, present acel, negative decels, occasional contractions -Ctx are mild to palpation.  -Cervix is unchanged while in MAU.  Discussed patient's questions on cholestasis with Dr. Charlesetta Garibaldi and helped clarify with patient her  plan of care. She will meet with MFM as planned this week.  Patient  Assessment and Plan   1. Vaginal discharge during pregnancy in third trimester   2. Supervision of other normal pregnancy, antepartum    2. Patient stable for discharge. Reviewed patient's physical presentation, lab work, NST and subjective complaints with Dr. Charlesetta Garibaldi. Dr. Charlesetta Garibaldi agrees patient is stable for discharge.  3. Patient amenable to plan; knows to return to MAU if her condition were to change or worsen.   Mervyn Skeeters Hipolito Martinezlopez 06/21/2017, 7:13 PM

## 2017-06-21 NOTE — Discharge Instructions (Signed)
Colestasis del embarazo (Cholestasis of Pregnancy) El trmino colestasis hace referencia a cualquier afeccin que provoca el enlentecimiento o la interrupcin del flujo del lquido de la digestin (bilis) que el [place]. La colestasis del embarazo es ms frecuente hacia el final del Media planner (tercertrimestre), pero puede presentarse en cualquier momento durante la gestacin. Con frecuencia, la afeccin desaparece tan pronto como nace el beb. La colestasis puede causar molestias, pero a menudo no tiene efectos nocivos para usted; sin embargo, puede ser daina para el beb. La colestasis puede aumentar el riesgo de que el beb nazca demasiado pronto (parto prematuro). CAUSAS La causa de la colestasis del embarazo no se conoce. Las hormonas del embarazo pueden afectar el funcionamiento de la vescula biliar. Normalmente, la vescula biliar contiene la bilis que el hgado fabrica hasta que se la necesita para ayudar a Publishing copy las grasas de la dieta. Las hormonas del Event organiser el flujo de la bilis y hacer que retroceda al hgado. Luego, la bilis ingresa al torrente sanguneo y provoca sntomas de colestasis. FACTORES DE RIESGO Puede correr ms riesgo si:  Tuvo colestasis durante un embarazo anterior.  Tiene antecedentes familiares de colestasis.  Tiene problemas de hgado.  Espera gemelos. [place] sntoma ms frecuente de la colestasis del embarazo es la picazn intensa, especialmente de las palmas de las manos y las plantas de los pies. La picazn puede extenderse al resto del cuerpo y suele ser peor por la noche. Generalmente, no tendr una erupcin cutnea. Otros sntomas son:  Cansancio.  Coloracin amarillenta de la piel y la parte blanca de los ojos (ictericia).  Orina de color oscuro.  Heces de color claro.  Prdida del apetito. DIAGNSTICO El mdico revisar sus antecedentes mdicos y le har un examen fsico. Tal vez le hagan anlisis de sangre  para controlar la funcin heptica, el nivel de bilis y de bilirrubina. TRATAMIENTO El objetivo del tratamiento es hacer que se sienta ms cmoda y proteger al beb. El mdico puede recetar medicamentos para Astronomer. El medicamento que se [place] tambin puede [employer] de los anlisis de sangre y Air traffic controller a Museum/gallery curator al beb. Adems, el mdico puede darle vitaminaK antes del parto para evitar el sangrado excesivo. Es posible que el mdico quiera controlar frecuentemente al beb (monitoreo fetal), por ejemplo, cada 2semanas. Una vez que los pulmones del beb estn suficientemente desarrollados, el mdico puede recomendar el inicio (induccin) del Streetman de [place] y el parto en la semana37de embarazo. INSTRUCCIONES PARA EL CUIDADO EN EL HOGAR  Use las cremas para Barrister's clerk y tome los medicamentos solamente como se lo haya indicado el mdico.  Tome baos de inmersin en agua fra para Barrister's clerk.  Mantenga las uas cortas para no irritar la piel al rascarse.  Concurra a todas las visitas de monitoreo fetal.  SOLICITE ATENCIN MDICA SI: Los sntomas empeoran a pesar del Clinical research associate. SOLICITE ATENCIN MDICA DE INMEDIATO SI: Comienza el trabajo de parto prematuro en su casa. ASEGRESE DE QUE:  Comprende estas instrucciones.  Controlar su afeccin.  Recibir ayuda de inmediato si no mejora o si empeora.  Esta informacin no tiene Marine scientist el consejo del mdico. Asegrese de hacerle al mdico cualquier pregunta que tenga. Document Released: 01/06/2005 Document Revised: 04/19/2014 Document Reviewed: 01/19/2013 Elsevier Interactive Patient Education  2017 [employer].

## 2017-06-21 NOTE — MAU Note (Signed)
Pt presents with c/opelvic pressure and vaginal numbess.  Pt states pressure & numbness began last night but has worsened.  Denies VB or LOF.  Reports +FM.

## 2017-06-21 NOTE — MAU Note (Addendum)
Pt is having increasing vaginal pressure and numbness. Is getting 17P shots. Was seen in the office yesterday. Having irregular contractions. Having some watery discharge.

## 2017-06-21 NOTE — Progress Notes (Signed)
Wet prep and GC/ Chlamydia cultures obtained by Eusebio Me, CNM

## 2017-06-22 LAB — GC/CHLAMYDIA PROBE AMP (~~LOC~~) NOT AT ARMC
CHLAMYDIA, DNA PROBE: NEGATIVE
Neisseria Gonorrhea: NEGATIVE

## 2017-06-23 ENCOUNTER — Other Ambulatory Visit: Payer: Self-pay | Admitting: Obstetrics and Gynecology

## 2017-06-23 ENCOUNTER — Ambulatory Visit (HOSPITAL_COMMUNITY)
Admission: RE | Admit: 2017-06-23 | Discharge: 2017-06-23 | Disposition: A | Discharge status: Home / Self Care | Payer: Medicaid Other | Source: Ambulatory Visit | Attending: Obstetrics and Gynecology | Admitting: Obstetrics and Gynecology

## 2017-06-23 ENCOUNTER — Encounter (HOSPITAL_COMMUNITY): Payer: Self-pay

## 2017-06-23 DIAGNOSIS — O368130 Decreased fetal movements, third trimester, not applicable or unspecified: Secondary | ICD-10-CM | POA: Diagnosis not present

## 2017-06-23 DIAGNOSIS — O26893 Other specified pregnancy related conditions, third trimester: Secondary | ICD-10-CM | POA: Insufficient documentation

## 2017-06-23 DIAGNOSIS — R238 Other skin changes: Secondary | ICD-10-CM | POA: Insufficient documentation

## 2017-06-23 DIAGNOSIS — O26613 Liver and biliary tract disorders in pregnancy, third trimester: Secondary | ICD-10-CM | POA: Diagnosis not present

## 2017-06-23 DIAGNOSIS — K831 Obstruction of bile duct: Secondary | ICD-10-CM | POA: Diagnosis not present

## 2017-06-23 DIAGNOSIS — Z3A36 36 weeks gestation of pregnancy: Secondary | ICD-10-CM | POA: Insufficient documentation

## 2017-06-23 DIAGNOSIS — Z3480 Encounter for supervision of other normal pregnancy, unspecified trimester: Secondary | ICD-10-CM

## 2017-06-23 HISTORY — DX: Gestational diabetes mellitus in pregnancy, unspecified control: O24.419

## 2017-06-23 NOTE — Consult Note (Signed)
Maternal Fetal Medicine Consultation  I had the pleasure of seeing your patient [name] for Maternal-Fetal Medicine consultation on 06/23/2017. As you know, [name] is a 27 y.o. [id number] at [redacted]w[redacted]d who presents for consultation regarding timing of delivery in the setting of intrahepatic cholestasis of pregnancy (ICP). [name]'s pregnancy is also notable for A1GDM and a history of late-preterm birth.  [name] developed itching of palms and soles about two weeks ago. Multiple erythematous papules were noted over her arms, legs and face on exam at that time. She was prescribed topical hydrocortisone cream as well as other supportive measures and seen by dermatology for consultation. Bile acids and CMP also sent at that time. On 06/20/17 per prenatal notes state her LFTs and bile acids were found to be elevated. I do not have these labs available for review. She was started on ursodiol. [name] reports her itching and rash have resolved since starting ursodiol. She was also scheduled for twice weekly NST with the diagnosis of ICP.   She has been receiving Makena for a history of a 36 week delivery. [name] reports her blood sugars have been well controlled on diet alone. Most recent growth ultrasound showed an estimated fetal weight in the 81%ile on 06/15/17.   [name] reports intermittent contractions today. She denies leakage of fluid or vaginal bleeding. She reports decreased fetal movement over the past several days, though she did report fetal movement during the course of our consultation.   The remainder of [name]'s medical history is unremarkable. She has no past surgical history. She takes prenatal vitamins, ursodiol, and tylenol prn and is allergic to no medications. Grisell denies alcohol, tobacco or other drug use. Her family history is noncontributory.  I do not have the results of [name]'s bile acid levels or LFTs for review. However, her clinical picture is consistent with a diagnosis of ICP. We  discussed that ICP typically occurs in the second and third trimesters and is characterized by pruritus and elevated bile acids. [name] understands that the etiology of ICP is unclear. Maternal outcomes are typically excellent, as it is not generally associated with long-term hepatobiliary issues.  ICP is however associated with an increased risk of poor fetal outcome including stillbirth, preterm birth, in utero passage of meconium, and an increased risk of RDS. The incidence of stillbirth attributed to ICP is about 1.2% after 37 weeks. The majority of stillbirths occur after 38 weeks and are unpredictable. The etiology of stillbirth is unclear, but it is known that bile acids cross the placenta and higher serum total bile acid concentrations (>/= 40 micromol/L) are associated with worse fetal outcomes. Treatment with ursodial is often warranted for both maternal symptom relief and reduction of rates of fetal complications. About 40% of women will have resolution of pruritis with ursodiol treatment and at least 60% receive some symptomatic benefit.   Given the risk of IUFD, I recommend early term delivery at 24 weeks without amniocentesis. [name] has a history of three vaginal deliveries, so I recommend an induction of labor with cesarean section reserved for routine obstetric indications. ICP is not a contraindication to breastfeeding.   Thank you for the opportunity to be a part of the care of [name]. Please contact our office if we can be of further assistance.   I spent approximately 40 minutes with this patient with over 50% of time spent in face-to-face counseling. Recommendations discussed with Dr. Charlesetta Garibaldi.  Abram Sander, MD Maternal-Fetal Medicine

## 2017-06-24 ENCOUNTER — Encounter (HOSPITAL_COMMUNITY): Payer: Self-pay | Admitting: *Deleted

## 2017-06-24 ENCOUNTER — Telehealth (HOSPITAL_COMMUNITY): Payer: Self-pay | Admitting: *Deleted

## 2017-06-24 NOTE — Telephone Encounter (Signed)
Preadmission screen  

## 2017-06-25 ENCOUNTER — Inpatient Hospital Stay (HOSPITAL_COMMUNITY): Payer: Medicaid Other | Admitting: Anesthesiology

## 2017-06-25 ENCOUNTER — Inpatient Hospital Stay (HOSPITAL_COMMUNITY)
Admission: RE | Admit: 2017-06-25 | Discharge: 2017-06-27 | DRG: 805 | Disposition: A | Discharge status: Home / Self Care | Payer: Medicaid Other | Source: Ambulatory Visit | Attending: Obstetrics and Gynecology | Admitting: Obstetrics and Gynecology

## 2017-06-25 ENCOUNTER — Encounter (HOSPITAL_COMMUNITY): Payer: Self-pay

## 2017-06-25 ENCOUNTER — Other Ambulatory Visit: Payer: Self-pay

## 2017-06-25 DIAGNOSIS — O24420 Gestational diabetes mellitus in childbirth, diet controlled: Secondary | ICD-10-CM | POA: Diagnosis present

## 2017-06-25 DIAGNOSIS — Z3A37 37 weeks gestation of pregnancy: Secondary | ICD-10-CM | POA: Diagnosis not present

## 2017-06-25 DIAGNOSIS — K831 Obstruction of bile duct: Secondary | ICD-10-CM | POA: Diagnosis present

## 2017-06-25 DIAGNOSIS — O24419 Gestational diabetes mellitus in pregnancy, unspecified control: Secondary | ICD-10-CM | POA: Diagnosis present

## 2017-06-25 DIAGNOSIS — O26613 Liver and biliary tract disorders in pregnancy, third trimester: Secondary | ICD-10-CM

## 2017-06-25 DIAGNOSIS — O26643 Intrahepatic cholestasis of pregnancy, third trimester: Secondary | ICD-10-CM | POA: Diagnosis present

## 2017-06-25 DIAGNOSIS — O2662 Liver and biliary tract disorders in childbirth: Principal | ICD-10-CM | POA: Diagnosis present

## 2017-06-25 DIAGNOSIS — Z3480 Encounter for supervision of other normal pregnancy, unspecified trimester: Secondary | ICD-10-CM

## 2017-06-25 LAB — TYPE AND SCREEN
ABO/RH(D): A POS
Antibody Screen: NEGATIVE

## 2017-06-25 LAB — GLUCOSE, CAPILLARY
Glucose-Capillary: 71 mg/dL (ref 65–99)
Glucose-Capillary: 80 mg/dL (ref 65–99)
Glucose-Capillary: 81 mg/dL (ref 65–99)

## 2017-06-25 LAB — CBC
HCT: 33.6 % — ABNORMAL LOW (ref 36.0–46.0)
Hemoglobin: 11.8 g/dL — ABNORMAL LOW (ref 12.0–15.0)
MCH: 30.6 pg (ref 26.0–34.0)
MCHC: 35.1 g/dL (ref 30.0–36.0)
MCV: 87.3 fL (ref 78.0–100.0)
Platelets: 211 10*3/uL (ref 150–400)
RBC: 3.85 MIL/uL — ABNORMAL LOW (ref 3.87–5.11)
RDW: 13.3 % (ref 11.5–15.5)
WBC: 8.9 10*3/uL (ref 4.0–10.5)

## 2017-06-25 LAB — RPR: RPR Ser Ql: NONREACTIVE

## 2017-06-25 MED ORDER — HYDROXYZINE HCL 50 MG PO TABS
50.0000 mg | ORAL_TABLET | Freq: Three times a day (TID) | ORAL | Status: DC | PRN
  Administered 2017-06-25 – 2017-06-27 (×5): 50 mg via ORAL
  Filled 2017-06-25 (×7): qty 1

## 2017-06-25 MED ORDER — LACTATED RINGERS IV SOLN: 500.0000 mL | Freq: Once | INTRAVENOUS | Status: DC

## 2017-06-25 MED ORDER — ZOLPIDEM TARTRATE 5 MG PO TABS: 5.0000 mg | ORAL_TABLET | Freq: Every evening | ORAL | Status: DC | PRN

## 2017-06-25 MED ORDER — ACETAMINOPHEN 325 MG PO TABS
650.0000 mg | ORAL_TABLET | ORAL | Status: DC | PRN
  Administered 2017-06-25 – 2017-06-27 (×6): 650 mg via ORAL
  Filled 2017-06-25 (×6): qty 2

## 2017-06-25 MED ORDER — EPHEDRINE 5 MG/ML INJ: 10.0000 mg | INTRAVENOUS | Status: DC | PRN

## 2017-06-25 MED ORDER — LIDOCAINE HCL (PF) 1 % IJ SOLN
INTRAMUSCULAR | Status: DC | PRN
  Administered 2017-06-25 (×2): 5 mL via EPIDURAL

## 2017-06-25 MED ORDER — OXYTOCIN BOLUS FROM INFUSION
500.0000 mL | Freq: Once | INTRAVENOUS | Status: AC
  Administered 2017-06-25: 500 mL via INTRAVENOUS

## 2017-06-25 MED ORDER — ONDANSETRON HCL 4 MG/2ML IJ SOLN: 4.0000 mg | Freq: Four times a day (QID) | INTRAMUSCULAR | Status: DC | PRN

## 2017-06-25 MED ORDER — DIPHENHYDRAMINE HCL 50 MG/ML IJ SOLN: 12.5000 mg | INTRAMUSCULAR | Status: DC | PRN

## 2017-06-25 MED ORDER — FENTANYL 2.5 MCG/ML BUPIVACAINE 1/10 % EPIDURAL INFUSION (WH - ANES)
14.0000 mL/h | INTRAMUSCULAR | Status: DC | PRN
  Administered 2017-06-25: 14 mL/h via EPIDURAL
  Filled 2017-06-25: qty 100

## 2017-06-25 MED ORDER — OXYCODONE HCL 5 MG PO TABS
5.0000 mg | ORAL_TABLET | ORAL | Status: DC | PRN
  Administered 2017-06-26: 5 mg via ORAL
  Filled 2017-06-25 (×3): qty 1

## 2017-06-25 MED ORDER — TERBUTALINE SULFATE 1 MG/ML IJ SOLN
0.2500 mg | Freq: Once | INTRAMUSCULAR | Status: DC | PRN
  Filled 2017-06-25: qty 1

## 2017-06-25 MED ORDER — SOD CITRATE-CITRIC ACID 500-334 MG/5ML PO SOLN: 30.0000 mL | ORAL | Status: DC | PRN

## 2017-06-25 MED ORDER — ACETAMINOPHEN 325 MG PO TABS: 650.0000 mg | ORAL_TABLET | ORAL | Status: DC | PRN

## 2017-06-25 MED ORDER — PHENYLEPHRINE 40 MCG/ML (10ML) SYRINGE FOR IV PUSH (FOR BLOOD PRESSURE SUPPORT)
80.0000 ug | PREFILLED_SYRINGE | INTRAVENOUS | Status: DC | PRN
  Filled 2017-06-25: qty 5
  Filled 2017-06-25: qty 10

## 2017-06-25 MED ORDER — LACTATED RINGERS IV SOLN
INTRAVENOUS | Status: DC
  Administered 2017-06-25: 150 mL/h via INTRAUTERINE

## 2017-06-25 MED ORDER — LACTATED RINGERS IV SOLN
INTRAVENOUS | Status: DC
  Administered 2017-06-25: 01:00:00 via INTRAVENOUS

## 2017-06-25 MED ORDER — SENNOSIDES-DOCUSATE SODIUM 8.6-50 MG PO TABS
2.0000 | ORAL_TABLET | ORAL | Status: DC
  Administered 2017-06-26 (×2): 2 via ORAL
  Filled 2017-06-25 (×2): qty 2

## 2017-06-25 MED ORDER — LACTATED RINGERS IV SOLN
500.0000 mL | INTRAVENOUS | Status: DC | PRN
  Administered 2017-06-25: 500 mL via INTRAVENOUS

## 2017-06-25 MED ORDER — TETANUS-DIPHTH-ACELL PERTUSSIS 5-2.5-18.5 LF-MCG/0.5 IM SUSP: 0.5000 mL | Freq: Once | INTRAMUSCULAR | Status: DC

## 2017-06-25 MED ORDER — PHENYLEPHRINE 40 MCG/ML (10ML) SYRINGE FOR IV PUSH (FOR BLOOD PRESSURE SUPPORT)
80.0000 ug | PREFILLED_SYRINGE | INTRAVENOUS | Status: DC | PRN
  Filled 2017-06-25: qty 5

## 2017-06-25 MED ORDER — WITCH HAZEL-GLYCERIN EX PADS: 1.0000 "application " | MEDICATED_PAD | CUTANEOUS | Status: DC | PRN

## 2017-06-25 MED ORDER — PHENYLEPHRINE 40 MCG/ML (10ML) SYRINGE FOR IV PUSH (FOR BLOOD PRESSURE SUPPORT): 80.0000 ug | PREFILLED_SYRINGE | INTRAVENOUS | Status: DC | PRN

## 2017-06-25 MED ORDER — OXYTOCIN 40 UNITS IN LACTATED RINGERS INFUSION - SIMPLE MED
2.5000 [IU]/h | INTRAVENOUS | Status: DC
  Filled 2017-06-25: qty 1000

## 2017-06-25 MED ORDER — HYDROXYZINE HCL 50 MG PO TABS
50.0000 mg | ORAL_TABLET | Freq: Four times a day (QID) | ORAL | Status: DC | PRN
  Administered 2017-06-25: 50 mg via ORAL
  Filled 2017-06-25 (×2): qty 1

## 2017-06-25 MED ORDER — SIMETHICONE 80 MG PO CHEW: 80.0000 mg | CHEWABLE_TABLET | ORAL | Status: DC | PRN

## 2017-06-25 MED ORDER — MISOPROSTOL 25 MCG QUARTER TABLET
25.0000 ug | ORAL_TABLET | ORAL | Status: DC | PRN
  Administered 2017-06-25 (×2): 25 ug via VAGINAL
  Filled 2017-06-25 (×3): qty 1

## 2017-06-25 MED ORDER — EPHEDRINE 5 MG/ML INJ
10.0000 mg | INTRAVENOUS | Status: DC | PRN
  Filled 2017-06-25: qty 2

## 2017-06-25 MED ORDER — ONDANSETRON HCL 4 MG PO TABS: 4.0000 mg | ORAL_TABLET | ORAL | Status: DC | PRN

## 2017-06-25 MED ORDER — LIDOCAINE HCL (PF) 1 % IJ SOLN
30.0000 mL | INTRAMUSCULAR | Status: DC | PRN
  Filled 2017-06-25: qty 30

## 2017-06-25 MED ORDER — OXYCODONE HCL 5 MG PO TABS
10.0000 mg | ORAL_TABLET | ORAL | Status: DC | PRN
  Administered 2017-06-25 – 2017-06-27 (×6): 10 mg via ORAL
  Filled 2017-06-25 (×5): qty 2

## 2017-06-25 MED ORDER — COCONUT OIL OIL
1.0000 "application " | TOPICAL_OIL | Status: DC | PRN
  Filled 2017-06-25: qty 120

## 2017-06-25 MED ORDER — PRENATAL MULTIVITAMIN CH
1.0000 | ORAL_TABLET | Freq: Every day | ORAL | Status: DC
  Administered 2017-06-26: 1 via ORAL
  Filled 2017-06-25: qty 1

## 2017-06-25 MED ORDER — IBUPROFEN 600 MG PO TABS
600.0000 mg | ORAL_TABLET | Freq: Four times a day (QID) | ORAL | Status: DC
  Administered 2017-06-25 – 2017-06-27 (×8): 600 mg via ORAL
  Filled 2017-06-25 (×8): qty 1

## 2017-06-25 MED ORDER — DIBUCAINE 1 % RE OINT: 1.0000 "application " | TOPICAL_OINTMENT | RECTAL | Status: DC | PRN

## 2017-06-25 MED ORDER — ONDANSETRON HCL 4 MG/2ML IJ SOLN: 4.0000 mg | INTRAMUSCULAR | Status: DC | PRN

## 2017-06-25 MED ORDER — FENTANYL CITRATE (PF) 100 MCG/2ML IJ SOLN
50.0000 ug | INTRAMUSCULAR | Status: DC | PRN
Start: 2017-06-25 — End: 2017-06-25
  Administered 2017-06-25 (×2): 100 ug via INTRAVENOUS
  Filled 2017-06-25 (×2): qty 2

## 2017-06-25 MED ORDER — BENZOCAINE-MENTHOL 20-0.5 % EX AERO: 1.0000 "application " | INHALATION_SPRAY | CUTANEOUS | Status: DC | PRN

## 2017-06-25 NOTE — Progress Notes (Signed)
  Subjective: Feeling some pressure.  Objective: BP 98/63 (BP Location: Left Arm)   Pulse 69   Temp 98.1 F (36.7 C) (Oral)   Resp 16   Ht 5' (1.524 m)   Wt 87.1 kg (192 lb)   LMP 09/26/2016   SpO2 100%   BMI 37.50 kg/m  No intake/output data recorded. No intake/output data recorded.  FHT: Category 2--moderate variability, sporadic variables UC:   irregular, every 2-6 minutes, with coupling SVE:   Dilation: 6.5 Effacement (%): 100 Station: -2 Exam by:: Cira Servant CNM  MUVs 170   Assessment:  Active labor Cat 2 FHR  Plan: Amnioinfusion now Anticipate progression of labor. Position to facilitate rotation/descent  Donnel Saxon CNM 06/25/2017, 11:38 AM

## 2017-06-25 NOTE — H&P (Signed)
[name] is a 27 y.o. female [phone] @ [redacted]w[redacted]d presenting for medical induction of labor due to cholestasis of pregnancy. Pregnancy complicated by gestational diabetes diet controlled. OB History    Gravida Para Term Preterm AB Living   5 3 2 1 1 3    SAB TAB Ectopic Multiple Live Births   1     0 3     Past Medical History:  Diagnosis Date  . Fibroid   . GERD (gastroesophageal reflux disease)   . Gestational diabetes   . Headache   . Medical history non-contributory   . Normal labor 10/11/2014   Past Surgical History:  Procedure Laterality Date  . NO PAST SURGERIES     Family History: family history includes Diabetes in her mother; Heart disease in her mother. Social History:  reports that  has never smoked. she has never used smokeless tobacco. She reports that she does not drink alcohol or use drugs.     Maternal Diabetes: Yes:  Diabetes Type:  Diet controlled Genetic Screening:  Maternal Ultrasounds/Referrals: Normal Fetal Ultrasounds or other Referrals:  None Maternal Substance Abuse:  No Significant Maternal Medications:  None Significant Maternal Lab Results:  Elevated Bile Acids Other Comments:  None  Review of Systems  Skin: Positive for itching and rash.  All other systems reviewed and are negative.  Maternal Medical History:  Fetal activity: Perceived fetal activity is normal.    Prenatal complications: Cholestasis   Prenatal Complications - Diabetes: gestational. Diabetes is managed by diet.        Last menstrual period 09/26/2016, currently breastfeeding. Maternal Exam:  Uterine Assessment: Contraction strength is mild.  Contraction frequency is irregular.   Abdomen: Patient reports no abdominal tenderness. Estimated fetal weight is 7.5.   Fetal presentation: vertex  Introitus: Normal vulva. Normal vagina.  Pelvis: adequate for delivery.   Cervix: Cervix evaluated by digital exam.     Physical Exam  Nursing note and vitals  reviewed. Constitutional: She is oriented to person, place, and time. She appears well-developed and well-nourished.  HENT:  Head: Normocephalic.  Neck: Normal range of motion.  Cardiovascular: Normal rate.  Respiratory: Effort normal. No respiratory distress.  GI: Soft.  Genitourinary: Vagina normal.  Musculoskeletal: Normal range of motion.  Neurological: She is alert and oriented to person, place, and time.  Skin: Skin is warm and dry. Rash noted. There is erythema.  Psychiatric: She has a normal mood and affect. Her behavior is normal.   VE 1-2/50/-1 FHT's Cat 1 uc-irregular Prenatal labs: ABO, Rh: A/Positive/-- (09/26 4401) Antibody: Negative (09/26 0958) Rubella: Nonimmune (10/04 0000) RPR: Nonreactive (10/04 0000)  HBsAg: Negative (10/04 0000)  HIV: Non-reactive (10/04 0000)  GBS: Negative (02/27 0000)   Assessment/Plan: Admit L/D cytotec for cervical ripening Epidural when desired Anticipate vaginal delivery  Lori A Clemmons CNM 06/25/2017, 12:12 AM

## 2017-06-25 NOTE — Anesthesia Preprocedure Evaluation (Signed)
Anesthesia Evaluation  Patient identified by MRN, date of birth, ID band Patient awake    Reviewed: Allergy & Precautions, H&P , NPO status , Patient's Chart, lab work & pertinent test results  Airway Mallampati: II   Neck ROM: full    Dental   Pulmonary neg pulmonary ROS,    breath sounds clear to auscultation       Cardiovascular negative cardio ROS   Rhythm:regular Rate:Normal     Neuro/Psych  Headaches,    GI/Hepatic GERD  ,  Endo/Other  diabetes, Gestational  Renal/GU      Musculoskeletal   Abdominal   Peds  Hematology   Anesthesia Other Findings   Reproductive/Obstetrics (+) Pregnancy                             Anesthesia Physical Anesthesia Plan  ASA: II  Anesthesia Plan: Epidural   Post-op Pain Management:    Induction: Intravenous  PONV Risk Score and Plan: 2 and Treatment may vary due to age or medical condition  Airway Management Planned: Natural Airway  Additional Equipment:   Intra-op Plan:   Post-operative Plan:   Informed Consent: I have reviewed the patients History and Physical, chart, labs and discussed the procedure including the risks, benefits and alternatives for the proposed anesthesia with the patient or authorized representative who has indicated his/her understanding and acceptance.     Plan Discussed with: Anesthesiologist  Anesthesia Plan Comments:         Anesthesia Quick Evaluation

## 2017-06-25 NOTE — Anesthesia Pain Management Evaluation Note (Signed)
  CRNA Pain Management Visit Note  Patient: [name], 27 y.o., female  "Hello I am a member of the anesthesia team at [facility]. We have an anesthesia team available at all times to provide care throughout the hospital, including epidural management and anesthesia for C-section. I don't know your plan for the delivery whether it a natural birth, water birth, IV sedation, nitrous supplementation, doula or epidural, but we want to meet your pain goals."   1.Was your pain managed to your expectations on prior hospitalizations?   Yes   2.What is your expectation for pain management during this hospitalization?     Epidural  3.How can we help you reach that goal? epidural  Record the patient's initial score and the patient's pain goal.   Pain: 0  Pain Goal: 4 [facility] wants you to be able to say your pain was always managed very well.  Kabao Leite 06/25/2017

## 2017-06-25 NOTE — Progress Notes (Signed)
  Subjective: Comfortable with epidural.  Objective: BP 109/64   Pulse 71   Temp 98.1 F (36.7 C) (Oral)   Resp 16   Ht 5' (1.524 m)   Wt 87.1 kg (192 lb)   LMP 09/26/2016   SpO2 100%   BMI 37.50 kg/m  No intake/output data recorded. No intake/output data recorded.   Vitals:   06/25/17 0825 06/25/17 0830 06/25/17 0835 06/25/17 0900  BP: 109/61 109/68 110/65 109/64  Pulse: 73 72 67 71  Resp: 16 16 16    Temp: 98.1 F (36.7 C)     TempSrc: Oral     SpO2: 100%     Weight:      Height:        FHT: Category 1--difficult at times to trace due to fetal activity UC:   regular, every 3-4 minutes SVE:   Dilation: 4 Effacement (%): 70 Station: -2 Exam by:: Cira Servant CNM  Significant downward pressure with UCs, BBOW. AROM, very light MSF noted. IUPC and FSE inserted without difficulty Last dose Cytotech 0533  Assessment:  Induction at 37 1/7 weeks due to cholestasis Early labor Light MSF GBS negative  Plan: Continue current care. Augment prn if no change in next 2 hours.  Donnel Saxon CNM 06/25/2017, 10:18 AM

## 2017-06-25 NOTE — Anesthesia Procedure Notes (Signed)
Epidural Patient location during procedure: OB Start time: 06/25/2017 7:56 AM End time: 06/25/2017 8:03 AM  Staffing Anesthesiologist: Albertha Ghee, MD Performed: anesthesiologist   Preanesthetic Checklist Completed: patient identified, site marked, pre-op evaluation, timeout performed, IV checked, risks and benefits discussed and monitors and equipment checked  Epidural Patient position: sitting Prep: DuraPrep Patient monitoring: heart rate, cardiac monitor, continuous pulse ox and blood pressure Approach: midline Location: L2-L3 Injection technique: LOR saline  Needle:  Needle type: Tuohy  Needle gauge: 17 G Needle length: 9 cm Needle insertion depth: 7 cm Catheter type: closed end flexible Catheter size: 19 Gauge Catheter at skin depth: 12 cm Test dose: negative and Other  Assessment Events: blood not aspirated, injection not painful, no injection resistance and negative IV test  Additional Notes Informed consent obtained prior to proceeding including risk of failure, 1% risk of PDPH, risk of minor discomfort and bruising.  Discussed rare but serious complications including epidural abscess, permanent nerve injury, epidural hematoma.  Discussed alternatives to epidural analgesia and patient desires to proceed.  Timeout performed pre-procedure verifying patient name, procedure, and platelet count.  Patient tolerated procedure well. Reason for block:procedure for pain

## 2017-06-25 NOTE — Progress Notes (Signed)
  Subjective: Breathing with UCs, ready for epidural.  Objective: BP 113/70 (BP Location: Left Arm)   Pulse 79   Temp 97.9 F (36.6 C) (Oral)   Resp 16   Ht 5' (1.524 m)   Wt 87.1 kg (192 lb)   LMP 09/26/2016   SpO2 99%   BMI 37.50 kg/m  No intake/output data recorded. No intake/output data recorded.   Vitals:   06/25/17 0642 06/25/17 0750 06/25/17 0805 06/25/17 0810  BP:  124/82 120/72 113/70  Pulse:  69 80 79  Resp:  16 16 16   Temp:      TempSrc:      SpO2: 94%  100% 99%  Weight:      Height:       Results for orders placed or performed during the hospital encounter of 06/25/17 (from the past 24 hour(s))  CBC     Status: Abnormal   Collection Time: 06/25/17 12:48 AM  Result Value Ref Range   WBC 8.9 4.0 - 10.5 K/uL   RBC 3.85 (L) 3.87 - 5.11 MIL/uL   Hemoglobin 11.8 (L) 12.0 - 15.0 g/dL   HCT 33.6 (L) 36.0 - 46.0 %   MCV 87.3 78.0 - 100.0 fL   MCH 30.6 26.0 - 34.0 pg   MCHC 35.1 30.0 - 36.0 g/dL   RDW 13.3 11.5 - 15.5 %   Platelets 211 150 - 400 K/uL  Type and screen [place]     Status: None   Collection Time: 06/25/17 12:48 AM  Result Value Ref Range   ABO/RH(D) A POS    Antibody Screen NEG    Sample Expiration      06/28/2017 Performed at [facility], [address], [place], [place] [address]   Glucose, capillary     Status: None   Collection Time: 06/25/17  1:19 AM  Result Value Ref Range   Glucose-Capillary 71 65 - 99 mg/dL  Glucose, capillary     Status: None   Collection Time: 06/25/17  5:29 AM  Result Value Ref Range   Glucose-Capillary 80 65 - 99 mg/dL    FHT: Category 1 UC:   regular, every 3 minutes SVE:   Dilation: 3 Effacement (%): 70 Station: -1 Exam by:: CNM Doralene Glanz 2nd dose Cytotech at 0533  Received IV Fentanyl 100 mcg 0405, 3149, with benefit Received po Vistaril 50 mg at 0404 for itching, with benefit  Assessment:  IUP at 37 1/7 weeks Induction for cholestasis GBS negative GDM  A1  Plan: Epidural prn Evaluate for AROM/pitocin after epidural placed. CBGs q 4 hours.  Donnel Saxon CNM 06/25/2017, 8:13 AM

## 2017-06-26 LAB — GLUCOSE, CAPILLARY: GLUCOSE-CAPILLARY: 129 mg/dL — AB (ref 65–99)

## 2017-06-26 LAB — CBC
HEMATOCRIT: 32 % — AB (ref 36.0–46.0)
HEMOGLOBIN: 10.7 g/dL — AB (ref 12.0–15.0)
MCH: 30.1 pg (ref 26.0–34.0)
MCHC: 33.4 g/dL (ref 30.0–36.0)
MCV: 89.9 fL (ref 78.0–100.0)
Platelets: 177 10*3/uL (ref 150–400)
RBC: 3.56 MIL/uL — ABNORMAL LOW (ref 3.87–5.11)
RDW: 13.8 % (ref 11.5–15.5)
WBC: 7.7 10*3/uL (ref 4.0–10.5)

## 2017-06-26 NOTE — Lactation Note (Signed)
This note was copied from a baby's chart. Lactation Consultation Note  Patient Name: [name] XNATF'T Date: 06/26/2017 Reason for consult: Initial assessment;Early term 37-38.6wks;Nipple pain/trauma  Visited with P4 Mom of early term baby at 48 hrs old.  Baby at 3% weight loss. Baby latched on in football hold, while Mom seated at side of bed eating her meal.  Added pillow support under baby.  Baby latched well, with nutritive sucking and swallowing.  Mom states her left breast has been tender even when she nursed her 71 yr old.  Mom using coconut oil after baby nurses.  Nipple skin looks intact. Taught Mom how to use alternate breast compression.  Baby came off breast, and watched her re-latch baby deeply on the breast.  Nipple looked rounded and not pinched.  Keep baby STS as much as possible, feeding often when baby cues.  Goal of 8-12 feedings per 24 hrs.  Talked about watching baby for sleepiness due to being an early term infant.  3rd baby was 36 weeks, and Mom is very comfortable with this.  Lactation brochure given.  Informed of IP and OP lactation support.  Encouraged Mom to call prn.  Maternal Data Formula Feeding for Exclusion: No Has patient been taught Hand Expression?: Yes Does the patient have breastfeeding experience prior to this delivery?: Yes  Feeding Feeding Type: Breast Fed  LATCH Score Latch: Grasps breast easily, tongue down, lips flanged, rhythmical sucking.  Audible Swallowing: Spontaneous and intermittent  Type of Nipple: Everted at rest and after stimulation  Comfort (Breast/Nipple): Filling, red/small blisters or bruises, mild/mod discomfort  Hold (Positioning): No assistance needed to correctly position infant at breast.  LATCH Score: 9  Interventions Interventions: Breast feeding basics reviewed;Adjust position;Support pillows;Position options;Expressed milk;Coconut oil;Hand pump  Lactation Tools Discussed/Used Tools: Pump;Coconut  oil Breast pump type: Manual WIC Program: Yes Pump Review: Setup, frequency, and cleaning;Milk Storage   Consult Status Consult Status: Follow-up Date: 06/27/17 Follow-up type: [place] 06/26/2017, 5:09 PM

## 2017-06-26 NOTE — Progress Notes (Signed)
Subjective: Postpartum Day 1: Vaginal delivery, periclitoral laceration, no repair required Patient up ad lib, reports no syncope or dizziness. Feeding:  Breast Contraceptive plan:  Declines--had increased weight gain on hormonal contraception in past  Objective: Vital signs in last 24 hours: Temp:  [97.6 F (36.4 C)-99 F (37.2 C)] 98.4 F (36.9 C) (03/17 0615) Pulse Rate:  [60-73] 63 (03/17 0615) Resp:  [16-20] 18 (03/17 0615) BP: (87-115)/(44-68) 108/63 (03/17 0615) SpO2:  [100 %] 100 % (03/16 0825)   Vitals:   06/25/17 1448 06/25/17 1556 06/25/17 2000 06/26/17 0615  BP: 111/62 115/65 112/67 108/63  Pulse: 63 60 61 63  Resp: 18 20 18 18   Temp: 97.6 F (36.4 C) 98.6 F (37 C) 99 F (37.2 C) 98.4 F (36.9 C)  TempSrc: Oral Oral Oral Oral  SpO2:      Weight:      Height:        Physical Exam:  General: alert Lochia: appropriate Uterine Fundus: firm Perineum: healing well DVT Evaluation: No evidence of DVT seen on physical exam. Negative Homan's sign.   CBC Latest Ref Rng & Units 06/26/2017 06/25/2017 01/05/2017  WBC 4.0 - 10.5 K/uL 7.7 8.9 6.9  Hemoglobin 12.0 - 15.0 g/dL 10.7(L) 11.8(L) 13.5  Hematocrit 36.0 - 46.0 % 32.0(L) 33.6(L) 40.1  Platelets 150 - 400 K/uL 177 211 201     Assessment/Plan: Status post vaginal delivery day 1. Stable Continue current care. Plan for discharge tomorrow  Declines circumcision.    [name] 06/26/2017, 8:23 AM

## 2017-06-26 NOTE — Anesthesia Postprocedure Evaluation (Signed)
Anesthesia Post Note  Patient: [name]  Procedure(s) Performed: AN AD HOC LABOR EPIDURAL     Patient location during evaluation: Mother Baby Anesthesia Type: Epidural Level of consciousness: awake and alert Pain management: pain level controlled Vital Signs Assessment: post-procedure vital signs reviewed and stable Respiratory status: spontaneous breathing, nonlabored ventilation and respiratory function stable Cardiovascular status: stable Postop Assessment: no headache, no backache and epidural receding Anesthetic complications: no    Last Vitals:  Vitals:   06/25/17 2000 06/26/17 0615  BP: 112/67 108/63  Pulse: 61 63  Resp: 18 18  Temp: 37.2 C 36.9 C  SpO2:      Last Pain:  Vitals:   06/26/17 0615  TempSrc: Oral  PainSc:    Pain Goal:                 Fransico Him Shirlean Schlein

## 2017-06-27 MED ORDER — OXYCODONE-ACETAMINOPHEN 5-325 MG PO TABS: 1.0000 | ORAL_TABLET | Freq: Four times a day (QID) | ORAL | 0 refills | Status: DC | PRN

## 2017-06-27 MED ORDER — MEASLES, MUMPS & RUBELLA VAC ~~LOC~~ INJ
0.5000 mL | INJECTION | Freq: Once | SUBCUTANEOUS | Status: AC
  Administered 2017-06-27: 0.5 mL via SUBCUTANEOUS
  Filled 2017-06-27 (×2): qty 0.5

## 2017-06-27 MED ORDER — IBUPROFEN 600 MG PO TABS: 600.0000 mg | ORAL_TABLET | Freq: Four times a day (QID) | ORAL | 0 refills | Status: DC

## 2017-06-27 MED ORDER — HYDROXYZINE HCL 50 MG PO TABS: 50.0000 mg | ORAL_TABLET | Freq: Three times a day (TID) | ORAL | 0 refills | Status: DC | PRN

## 2017-06-27 NOTE — Discharge Summary (Deleted)
OB Discharge Summary     Patient Name: [name] DOB: May 30, 1990 MRN: [id number]  Date of admission: 06/25/2017 Delivering MD: Donnel Saxon   Date of discharge: 06/27/2017  Admitting diagnosis: 55 wk induction Intrauterine pregnancy: [redacted]w[redacted]d     Secondary diagnosis:  Principal Problem:   Vaginal delivery Active Problems:   Cholestasis during pregnancy in third trimester   Gestational diabetes  Additional problems:      Discharge diagnosis: Term Pregnancy Delivered, GDM A1 and Cholestatasis                                                                                                Post partum procedures:  Augmentation: AROM and Cytotec  Complications: None  Hospital course:  Induction of Labor With Vaginal Delivery   27 y.o. yo [id number] at [redacted]w[redacted]d was admitted to the hospital 06/25/2017 for induction of labor.  Indication for induction: Cholestasis of pregnancy.  Patient had an uncomplicated labor course as follows: Membrane Rupture Time/Date: 10:07 AM ,06/25/2017   Intrapartum Procedures: Episiotomy: None [1]                                         Lacerations:     Patient had delivery of a Viable infant.  Information for the patient's newborn:  Tuere, Nwosu [[id number]]  Delivery Method: Vag-Spont   06/25/2017  Details of delivery can be found in separate delivery note.  Patient had a routine postpartum course. Patient is discharged home 06/27/17.  Physical exam  Vitals:   06/25/17 1556 06/25/17 2000 06/26/17 0615 06/26/17 1734  BP: 115/65 112/67 108/63 123/76  Pulse: 60 61 63 76  Resp: 20 18 18 18   Temp: 98.6 F (37 C) 99 F (37.2 C) 98.4 F (36.9 C) (!) 97.5 F (36.4 C)  TempSrc: Oral Oral Oral Oral  SpO2:    100%  Weight:      Height:       General: alert, cooperative and no distress Lochia: appropriate Uterine Fundus: firm Incision:  DVT Evaluation: No evidence of DVT seen on physical exam. Labs: Lab Results  Component  Value Date   WBC 7.7 06/26/2017   HGB 10.7 (L) 06/26/2017   HCT 32.0 (L) 06/26/2017   MCV 89.9 06/26/2017   PLT 177 06/26/2017   CMP Latest Ref Rng & Units 11/08/2016  Glucose 65 - 99 mg/dL 100(H)  BUN 6 - 20 mg/dL 12  Creatinine 0.44 - 1.00 mg/dL 0.46  Sodium 135 - 145 mmol/L 137  Potassium 3.5 - 5.1 mmol/L 4.1  Chloride 101 - 111 mmol/L 110  CO2 22 - 32 mmol/L 21(L)  Calcium 8.9 - 10.3 mg/dL 8.8(L)  Total Protein 6.5 - 8.1 g/dL 7.0  Total Bilirubin 0.3 - 1.2 mg/dL 0.8  Alkaline Phos 38 - 126 U/L 70  AST 15 - 41 U/L 15  ALT 14 - 54 U/L 32    Discharge instruction: per After Visit Summary and "Baby and Me Booklet".  After visit meds:  Allergies as of 06/27/2017   No Known Allergies     Medication List    STOP taking these medications   hydrocortisone 2.5 % lotion   MAKENA 275 MG/1.1ML Soaj Generic drug:  HYDROXYprogesterone Caproate   ursodiol 250 MG tablet Commonly known as:  ACTIGALL     TAKE these medications   acetaminophen 500 MG tablet Commonly known as:  TYLENOL Take 500 mg by mouth every 6 (six) hours as needed for mild pain or headache.   prenatal multivitamin Tabs tablet Take 1 tablet by mouth daily.       Diet: routine diet  Activity: Advance as tolerated. Pelvic rest for 6 weeks.   Outpatient follow up:6 weeks Follow up Appt:No future appointments. Follow up Visit:No Follow-up on file.  Postpartum contraception:   Newborn Data: Live born female  Birth Weight: 7 lb 0.5 oz (3189 g) APGAR: 8, 9  Newborn Delivery   Birth date/time:  06/25/2017 13:13:00 Delivery type:  Vaginal, Spontaneous     Baby Feeding: Breast Disposition:home with mother   06/27/2017 Larey Days, CNM

## 2017-06-27 NOTE — Progress Notes (Signed)
Post Partum Day 2 Subjective: no complaints, up ad lib, voiding, tolerating PO, + flatus and breast feeding Patient denies HA, blurry vision or RUQ pain  Objective: Blood pressure (!) 143/83, pulse 74, temperature 97.9 F (36.6 C), temperature source Oral, resp. rate 18, height 5' (1.524 m), weight 87.1 kg (192 lb), last menstrual period 09/26/2016, SpO2 100 %, unknown if currently breastfeeding. ** repeat blood pressure 08:30 am: 110's/70's**  Physical Exam:  General: alert, cooperative and no distress Lochia: appropriate Uterine Fundus: firm U-1 Perineum: No heavy bleeding, normal lochia DVT Evaluation: No evidence of DVT seen on physical exam. Negative Homan's sign. No cords or calf tenderness.  Recent Labs    06/25/17 0048 06/26/17 0521  HGB 11.8* 10.7*  HCT 33.6* 32.0*    Assessment/Plan: Discharge home and Breastfeeding   LOS: 2 days   Aayliah Rotenberry, [place] 06/27/2017, 8:28 AM

## 2017-06-27 NOTE — Discharge Instructions (Signed)
Home Care Instructions for Mom °ACTIVITY °· Gradually return to your regular activities. °· Let yourself rest. Nap while your baby sleeps. °· Avoid lifting anything that is heavier than 10 lb (4.5 kg) until your health care provider says it is okay. °· Avoid activities that take a lot of effort and energy (are strenuous) until approved by your health care provider. Walking at a slow-to-moderate pace is usually safe. °· If you had a cesarean delivery: °? Do not vacuum, climb stairs, or drive a car for 4-6 weeks. °? Have someone help you at home until you feel like you can do your usual activities yourself. °? Do exercises as told by your health care provider, if this applies. ° °VAGINAL BLEEDING °You may continue to bleed for 4-6 weeks after delivery. Over time, the amount of blood usually decreases and the color of the blood usually gets lighter. However, the flow of bright red blood may increase if you have been too active. If you need to use more than one pad in an hour because your pad gets soaked, or if you pass a large clot: °· Lie down. °· Raise your feet. °· Place a cold compress on your lower abdomen. °· Rest. °· Call your health care provider. ° °If you are breastfeeding, your period should return anytime between 8 weeks after delivery and the time that you stop breastfeeding. If you are not breastfeeding, your period should return 6-8 weeks after delivery. °PERINEAL CARE °The perineal area, or perineum, is the part of your body between your thighs. After delivery, this area needs special care. Follow these instructions as told by your health care provider. °· Take warm tub baths for 15-20 minutes. °· Use medicated pads and pain-relieving sprays and creams as told. °· Do not use tampons or douches until vaginal bleeding has stopped. °· Each time you go to the bathroom: °? Use a peri bottle. °? Change your pad. °? Use towelettes in place of toilet paper until your stitches have healed. °· Do Kegel exercises  every day. Kegel exercises help to maintain the muscles that support the vagina, bladder, and bowels. You can do these exercises while you are standing, sitting, or lying down. To do Kegel exercises: °? Tighten the muscles of your abdomen and the muscles that surround your birth canal. °? Hold for a few seconds. °? Relax. °? Repeat until you have done this 5 times in a row. °· To prevent hemorrhoids from developing or getting worse: °? Drink enough fluid to keep your urine clear or pale yellow. °? Avoid straining when having a bowel movement. °? Take over-the-counter medicines and stool softeners as told by your health care provider. ° °BREAST CARE °· Wear a tight-fitting bra. °· Avoid taking over-the-counter pain medicine for breast discomfort. °· Apply ice to the breasts to help with discomfort as needed: °? Put ice in a plastic bag. °? Place a towel between your skin and the bag. °? Leave the ice on for 20 minutes or as told by your health care provider. ° °NUTRITION °· Eat a well-balanced diet. °· Do not try to lose weight quickly by cutting back on calories. °· Take your prenatal vitamins until your postpartum checkup or until your health care provider tells you to stop. ° °POSTPARTUM DEPRESSION °You may find yourself crying for no apparent reason and unable to cope with all of the changes that come with having a newborn. This mood is called postpartum depression. Postpartum depression happens because your hormone   levels change after delivery. If you have postpartum depression, get support from your partner, friends, and family. If the depression does not go away on its own after several weeks, contact your health care provider. BREAST SELF-EXAM Do a breast self-exam each month, at the same time of the month. If you are breastfeeding, check your breasts just after a feeding, when your breasts are less full. If you are breastfeeding and your period has started, check your breasts on day 5, 6, or 7 of your  period. Report any lumps, bumps, or discharge to your health care provider. Know that breasts are normally lumpy if you are breastfeeding. This is temporary, and it is not a health risk. INTIMACY AND SEXUALITY Avoid sexual activity for at least 3-4 weeks after delivery or until the brownish-red vaginal flow is completely gone. If you want to avoid pregnancy, use some form of birth control. You can get pregnant after delivery, even if you have not had your period. SEEK MEDICAL CARE IF:  You feel unable to cope with the changes that a child brings to your life, and these feelings do not go away after several weeks.  You notice a lump, a bump, or discharge on your breast.  SEEK IMMEDIATE MEDICAL CARE IF:  Blood soaks your pad in 1 hour or less.  You have: ? Severe pain or cramping in your lower abdomen. ? A bad-smelling vaginal discharge. ? A fever that is not controlled by medicine. ? A fever, and an area of your breast is red and sore. ? Pain or redness in your calf. ? Sudden, severe chest pain. ? Shortness of breath. ? Painful or bloody urination. ? Problems with your vision.  You vomit for 12 hours or longer.  You develop a severe headache.  You have serious thoughts about hurting yourself, your child, or anyone else.  This information is not intended to replace advice given to you by your health care provider. Make sure you discuss any questions you have with your health care provider. Document Released: 03/26/2000 Document Revised: 09/04/2015 Document Reviewed: 09/30/2014 Elsevier Interactive Patient Education  2017 [employer] Cholestasis of Pregnancy Cholestasis refers to any condition that causes the flow of digestive fluid (bile) produced by the liver to slow down or stop. Cholestasis of pregnancy is most common toward the end of pregnancy (thirdtrimester), but it can occur any time during pregnancy. The condition often goes away soon after giving birth. Cholestasis may  be uncomfortable, but it is usually harmless to you. However, it can be harmful to your baby. Cholestasis may increase the risk of:  Your baby being born too early (preterm delivery).  Your baby having a slow heart rate and lack of oxygen during delivery (fetal distress).  Losing your baby before delivery (stillbirth).  What are the causes? The exact cause of this condition is not known, but it may be related to:  Pregnancy hormones. The gallbladder normally holds bile until you need it to help digest fat in your diet. Pregnancy hormones may cause the flow of bile to slow down and back up into your liver. Bile may then get into your bloodstream and cause cholestasis symptoms.  Changes in your genes (genetic mutations). Specifically, genes that affect how the liver releases bile.  What increases the risk? You are more likely to develop this condition if:  You had cholestasis during a previous pregnancy.  You have a family history of cholestasis.  You have liver problems.  You are having multiple  babies, such as twins or triplets.  What are the signs or symptoms? The most common symptom of this condition is intense itching (pruritus), especially on the palms of your hands and soles of your feet. The itching can spread to the rest of your body and is often worse at night. You will not usually have a rash. Other symptoms may include:  Feeling tired.  Pain in your upper right abdomen.  Dark-colored urine.  Light-colored stools.  Poor appetite.  Yellowish discoloration of your skin and the whites of your eyes (jaundice).  How is this diagnosed? This condition is diagnosed based on:  Your medical history.  A physical exam.  Blood tests.  If you have an inherited risk for developing this condition, you may also have genetic testing. How is this treated? The goal of treatment is to make you comfortable and keep your baby safe. Your health care provider may:  Prescribe  medicine to reduce bile acid in your bloodstream, relieve symptoms, and help keep your baby safe.  Give you vitamin K before delivery to prevent excessive bleeding.  Check your baby frequently (fetal monitoring).  Perform regular blood tests to check your bile levels and liver function until your baby is delivered.  Recommend starting (inducing) your labor and delivery by week 36 or 37 of pregnancy, or as soon as your baby's lungs have developed enough.  Follow these instructions at home:  Take over-the-counter and prescription medicines only as told by your health care provider.  Take cool baths to soothe itchy skin.  Wear comfortable, loose-fitting, cotton clothing to reduce itching.  Keep your fingernails short to prevent skin irritation from scratching.  Keep all follow-up visits and prenatal visits as told by your health care provider. This is important. Contact a health care provider if:  Your symptoms get worse, even with treatment.  You develop pain in your right side.  You have unusual swelling in your abdomen, feet, ankles, or legs.  You have a fever.  You are more thirsty than usual. Get help right away if:  You go into early labor.  You have a headache that does not go away or causes changes in vision.  You have nausea or you vomit.  You have severe pain in your abdomen or shoulders.  You have shortness of breath. Summary  Cholestasis of pregnancy is most common toward the end of pregnancy (thirdtrimester), but it can occur any time during your pregnancy.  The condition often goes away soon after your baby is born.  The most common symptom of cholestasis of pregnancy is intense itching (pruritus), especially on the palms of your hands and soles of your feet.  This condition may be treated with medicine, frequent monitoring, or starting (inducing) labor and delivery by week 36 or 37 of pregnancy. This information is not intended to replace advice given  to you by your health care provider. Make sure you discuss any questions you have with your health care provider. Document Released: 03/26/2000 Document Revised: 03/13/2016 Document Reviewed: 03/13/2016 Elsevier Interactive Patient Education  2017 [employer].

## 2017-06-27 NOTE — Lactation Note (Signed)
This note was copied from a baby's chart. Lactation Consultation Note  Patient Name: [name] Date: 06/27/2017 Reason for consult: Follow-up assessment   Baby 53 hours old.  P2. Mom encouraged to feed baby 8-12 times/24 hours and with feeding cues.  Reviewed engorgement care and monitoring voids/stools. Denies questions or concerns.   Maternal Data    Feeding Feeding Type: Breast Fed Length of feed: 60 min  LATCH Score                   Interventions    Lactation Tools Discussed/Used     Consult Status Consult Status: Complete    Carlye Grippe 06/27/2017, 10:16 AM

## 2017-06-27 NOTE — Discharge Summary (Signed)
Obstetric Discharge Summary Reason for Admission: induction of labor  For cholestasis of pregnancy Prenatal Procedures: NST Intrapartum Procedures: spontaneous vaginal delivery Postpartum Procedures: none Complications-Operative and Postpartum: none Hemoglobin  Date Value Ref Range Status  06/26/2017 10.7 (L) 12.0 - 15.0 g/dL Final  01/05/2017 13.5 11.1 - 15.9 g/dL Final   HCT  Date Value Ref Range Status  06/26/2017 32.0 (L) 36.0 - 46.0 % Final   Hematocrit  Date Value Ref Range Status  01/05/2017 40.1 34.0 - 46.6 % Final    Physical Exam:  General: alert, cooperative and no distress Lochia: appropriate Uterine Fundus: firm U-1 Perineum: No heavy bleeding, normal lochia DVT Evaluation: No evidence of DVT seen on physical exam. Negative Homan's sign. No cords or calf tenderness.  Discharge Diagnoses: Term Pregnancy-delivered  Discharge Information: Date: 06/27/2017 Activity: pelvic rest Diet: routine Medications: PNV, Ibuprofen, Percocet and vistaril Condition: stable Instructions: refer to practice specific booklet Discharge to: home [facility]. Schedule an appointment as soon as possible for a visit in 6 week(s).   Specialty:  Obstetrics and Gynecology Contact information: [address] [place] [address] [phone]          Newborn Data: Live born female  Birth Weight: 7 lb 0.5 oz (3189 g) APGAR: 8, 9  Newborn Delivery   Birth date/time:  06/25/2017 13:13:00 Delivery type:  Vaginal, Spontaneous     Home with mother.  Asuka Dusseau, [place] 06/27/2017, 8:33 AM

## 2017-11-22 ENCOUNTER — Other Ambulatory Visit: Payer: Self-pay

## 2017-11-22 ENCOUNTER — Inpatient Hospital Stay (HOSPITAL_COMMUNITY)
Admission: AD | Admit: 2017-11-22 | Discharge: 2017-11-22 | Disposition: A | Discharge status: Home / Self Care | Payer: Medicaid Other | Source: Ambulatory Visit | Attending: Obstetrics and Gynecology | Admitting: Obstetrics and Gynecology

## 2017-11-22 ENCOUNTER — Encounter (HOSPITAL_COMMUNITY): Payer: Self-pay

## 2017-11-22 DIAGNOSIS — W57XXXA Bitten or stung by nonvenomous insect and other nonvenomous arthropods, initial encounter: Secondary | ICD-10-CM | POA: Insufficient documentation

## 2017-11-22 DIAGNOSIS — N644 Mastodynia: Secondary | ICD-10-CM | POA: Insufficient documentation

## 2017-11-22 DIAGNOSIS — K219 Gastro-esophageal reflux disease without esophagitis: Secondary | ICD-10-CM | POA: Insufficient documentation

## 2017-11-22 DIAGNOSIS — T63481A Toxic effect of venom of other arthropod, accidental (unintentional), initial encounter: Secondary | ICD-10-CM | POA: Insufficient documentation

## 2017-11-22 LAB — URINALYSIS, ROUTINE W REFLEX MICROSCOPIC
Bilirubin Urine: NEGATIVE
Glucose, UA: NEGATIVE mg/dL
HGB URINE DIPSTICK: NEGATIVE
Ketones, ur: NEGATIVE mg/dL
NITRITE: NEGATIVE
Protein, ur: NEGATIVE mg/dL
SPECIFIC GRAVITY, URINE: 1.023 (ref 1.005–1.030)
pH: 5 (ref 5.0–8.0)

## 2017-11-22 LAB — POCT PREGNANCY, URINE: PREG TEST UR: NEGATIVE

## 2017-11-22 MED ORDER — DIPHENHYDRAMINE HCL 25 MG PO CAPS
50.0000 mg | ORAL_CAPSULE | Freq: Once | ORAL | Status: AC
  Administered 2017-11-22: 50 mg via ORAL
  Filled 2017-11-22: qty 2

## 2017-11-22 NOTE — MAU Note (Signed)
Pt presents to MAU with c/o right breast pain. She noticed a white bump below nipple area x 2 days ago that was red and itchy. The bump popped and had green/yellow discharge. Pt denies fever.

## 2017-11-22 NOTE — Discharge Instructions (Signed)
Insect Bite, Adult An insect bite can make your skin red, itchy, and swollen. Some insects can spread disease to people with a bite. However, most insect bites do not lead to disease, and most are not serious. Follow these instructions at home: Bite area care  Do not scratch the bite area.  Keep the bite area clean and dry.  Wash the bite area every day with soap and water as told by your doctor.  Check the bite area every day for signs of infection. Check for: ? More redness, swelling, or pain. ? Fluid or blood. ? Warmth. ? Pus. Managing pain, itching, and swelling  You may put any of these on the bite area as told by your doctor: ? A baking soda paste. ? Cortisone cream. ? Calamine lotion.  If directed, put ice on the bite area. ? Put ice in a plastic bag. ? Place a towel between your skin and the bag. ? Leave the ice on for 20 minutes, 2-3 times a day. Medicines  Take medicines or put medicines on your skin only as told by your doctor.  If you were prescribed an antibiotic medicine, use it as told by your doctor. Do not stop using the antibiotic even if your condition improves. General instructions  Keep all follow-up visits as told by your doctor. This is important. How is this prevented? To help you have a lower risk of insect bites:  When you are outside, wear clothing that covers your arms and legs.  Use insect repellent. The best insect repellents have: ? An active ingredient of DEET, picaridin, oil of lemon eucalyptus (OLE), or IR3535. ? Higher amounts of DEET or another active ingredient than other repellents have.  If your home windows do not have screens, think about putting some in.  Contact a doctor if:  You have more redness, swelling, or pain in the bite area.  You have fluid, blood, or pus coming from the bite area.  The bite area feels warm.  You have a fever. Get help right away if:  You have joint pain.  You have a rash.  You have  shortness of breath.  You feel more tired or sleepy than you normally do.  You have neck pain.  You have a headache.  You feel weaker than you normally do.  You have chest pain.  You have pain in your belly.  You feel sick to your stomach (nauseous) or you throw up (vomit). Summary  An insect bite can make your skin red, itchy, and swollen.  Do not scratch the bite area, and keep it clean and dry.  Ice can help with pain and itching from the bite. This information is not intended to replace advice given to you by your health care provider. Make sure you discuss any questions you have with your health care provider. Document Released: 03/26/2000 Document Revised: 10/30/2015 Document Reviewed: 08/14/2014 Elsevier Interactive Patient Education  2018 Elsevier Inc.  

## 2017-11-22 NOTE — MAU Provider Note (Signed)
History     CSN: [id number]  Arrival date and time: 11/22/17 1429   None     Chief Complaint  Patient presents with  . Breast Pain   Two days ago patient felt a sharp needle sensation below her right nipple like she had been stung by a bee. When she looked, she saw a small red bump that looked like a mosquito bite. Patient has been scratching at the bump and the area around it is now reddened. Yesterday the bump popped and released a small amount of yellow/green fluid on her nursing pad. She denies fever, flu-like symptoms, or trouble with breastfeeding her 25mo old.    OB History    Gravida  5   Para  4   Term  3   Preterm  1   AB  1   Living  4     SAB  1   TAB      Ectopic      Multiple  0   Live Births  4           Past Medical History:  Diagnosis Date  . Fibroid   . GERD (gastroesophageal reflux disease)   . Gestational diabetes   . Headache   . Normal labor 10/11/2014    Past Surgical History:  Procedure Laterality Date  . NO PAST SURGERIES      Family History  Problem Relation Age of Onset  . Diabetes Mother   . Heart disease Mother     Social History   Tobacco Use  . Smoking status: Never Smoker  . Smokeless tobacco: Never Used  Substance Use Topics  . Alcohol use: No  . Drug use: No    Allergies: No Known Allergies  Medications Prior to Admission  Medication Sig Dispense Refill Last Dose  . norethindrone (MICRONOR,CAMILA,ERRIN) 0.35 MG tablet Take 1 tablet by mouth daily.    11/21/2017 at Unknown time  . Prenatal Vit-Fe Fumarate-FA (PRENATAL MULTIVITAMIN) TABS tablet Take 1 tablet by mouth daily.    11/22/2017 at Unknown time  . ibuprofen (ADVIL,MOTRIN) 600 MG tablet Take 1 tablet (600 mg total) by mouth every 6 (six) hours. (Patient not taking: Reported on 11/22/2017) 30 tablet 0 Not Taking at Unknown time    Review of Systems  Constitutional: Negative for chills, fatigue and fever.  Skin: Positive for color change.  All  other systems reviewed and are negative.  Physical Exam   Vitals:   11/22/17 1447  Pulse: 93  Resp: 18  Temp: 98.2 F (36.8 C)  TempSrc: Oral    Physical Exam  Nursing note and vitals reviewed. Constitutional: She is oriented to person, place, and time. She appears well-developed and well-nourished.  HENT:  Head: Normocephalic.  Eyes: Pupils are equal, round, and reactive to light.  Cardiovascular: Normal rate, regular rhythm and normal heart sounds.  Respiratory: Effort normal and breath sounds normal.  GI: Soft. Bowel sounds are normal.  Genitourinary:  Genitourinary Comments: Right breast has small, discrete reddened area around a pinpoint scab. It measures approximately 3cmx3cm. It is not tender to palpation and there is no palpable fluctuant mass.   Musculoskeletal: Normal range of motion.  Neurological: She is alert and oriented to person, place, and time.  Skin: Skin is warm and dry. There is erythema.  Psychiatric: She has a normal mood and affect. Her behavior is normal. Judgment and thought content normal.    MAU Course  MDM Patient's lack of fever and flu-like symptoms  along with the history of the problem's onset do not suggest mastitis or clogged duct. Patient seems to be having a mild, local allergic reaction to a bug sting or bite. Gave Benadryl 50mg  and placed ice on area of concern. After 1 hour reassessed, patient states it feels much less painful and itchy. Erythema was markedly decreased. Patient to keep using icepacks and will take Benadryl OTC as needed for itching and swelling. Gave precautions, patient to call if symptoms worsen or she develops symptoms of infection.   Assessment and Plan  27 y.o. G5P4, 4 months postpartum Breastfeeding Allergic reaction to bug bite  Marikay Alar 11/22/2017, 3:57 PM

## 2018-02-06 ENCOUNTER — Emergency Department (HOSPITAL_COMMUNITY)
Admission: EM | Admit: 2018-02-06 | Discharge: 2018-02-06 | Disposition: A | Discharge status: Home / Self Care | Payer: Medicaid Other | Attending: Emergency Medicine | Admitting: Emergency Medicine

## 2018-02-06 ENCOUNTER — Encounter (HOSPITAL_COMMUNITY): Payer: Self-pay | Admitting: *Deleted

## 2018-02-06 DIAGNOSIS — H6691 Otitis media, unspecified, right ear: Secondary | ICD-10-CM | POA: Diagnosis not present

## 2018-02-06 DIAGNOSIS — H9201 Otalgia, right ear: Secondary | ICD-10-CM | POA: Diagnosis present

## 2018-02-06 DIAGNOSIS — Z79899 Other long term (current) drug therapy: Secondary | ICD-10-CM | POA: Insufficient documentation

## 2018-02-06 MED ORDER — AMOXICILLIN 500 MG PO CAPS: 500.0000 mg | ORAL_CAPSULE | Freq: Three times a day (TID) | ORAL | 0 refills | Status: DC

## 2018-02-06 NOTE — ED Provider Notes (Signed)
[facility] EMERGENCY DEPARTMENT Provider Note   CSN: [id number] Arrival date & time: 02/06/18  1521     History   Chief Complaint Chief Complaint  Patient presents with  . Otalgia    HPI [name] is a 27 y.o. female who presents to the ED with ear pain. The pain is in the right ear. Patient reports that she first thought it was due to her ear ring but she took them out and it continued to be painful inside her ear. Patient has gland swelling but denies fever or other problems.   HPI  Past Medical History:  Diagnosis Date  . Fibroid   . GERD (gastroesophageal reflux disease)   . Gestational diabetes   . Headache   . Normal labor 10/11/2014    Patient Active Problem List   Diagnosis Date Noted  . Cholestasis during pregnancy in third trimester 06/25/2017  . Vaginal delivery 06/25/2017  . Gestational diabetes 06/25/2017  . Supervision of other normal pregnancy, antepartum 01/05/2017  . History of preterm labor, current pregnancy, first trimester 11/08/2016  . H/O rapid labor 05/22/2014    Past Surgical History:  Procedure Laterality Date  . NO PAST SURGERIES       OB History    Gravida  5   Para  4   Term  3   Preterm  1   AB  1   Living  4     SAB  1   TAB      Ectopic      Multiple  0   Live Births  4            Home Medications    Prior to Admission medications   Medication Sig Start Date End Date Taking? Authorizing Provider  amoxicillin (AMOXIL) 500 MG capsule Take 1 capsule (500 mg total) by mouth 3 (three) times daily. 02/06/18   Ashley Murrain, NP  norethindrone (MICRONOR,CAMILA,ERRIN) 0.35 MG tablet Take 1 tablet by mouth daily.     [provider]  Prenatal Vit-Fe Fumarate-FA (PRENATAL MULTIVITAMIN) TABS tablet Take 1 tablet by mouth daily.     [provider]    Family History Family History  Problem Relation Age of Onset  . Diabetes Mother   . Heart disease Mother      Social History Social History   Tobacco Use  . Smoking status: Never Smoker  . Smokeless tobacco: Never Used  Substance Use Topics  . Alcohol use: No  . Drug use: No     Allergies   Patient has no known allergies.   Review of Systems Review of Systems  HENT: Positive for ear pain.   Hematological: Positive for adenopathy.  All other systems reviewed and are negative.    Physical Exam Updated Vital Signs BP 127/79 (BP Location: Right Arm)   Pulse 74   Temp 98.5 F (36.9 C) (Oral)   Resp 20   SpO2 100%   Breastfeeding? Yes   Physical Exam  Constitutional: She appears well-developed and well-nourished. No distress.  HENT:  Head: Normocephalic.  Right Ear: No mastoid tenderness. Tympanic membrane is erythematous and bulging.  Left Ear: Tympanic membrane normal.  Eyes: EOM are normal.  Neck: Neck supple.  Cardiovascular: Normal rate.  Pulmonary/Chest: Effort normal.  Musculoskeletal: Normal range of motion.  Lymphadenopathy:    She has cervical adenopathy.  Neurological: She is alert.  Skin: Skin is warm and dry.  Psychiatric: She has a normal mood  and affect.  Nursing note and vitals reviewed.    ED Treatments / Results  Labs (all labs ordered are listed, but only abnormal results are displayed) Labs Reviewed - No data to display  Radiology No results found.  Procedures Procedures (including critical care time)  Medications Ordered in ED Medications - No data to display   Initial Impression / Assessment and Plan / ED Course  I have reviewed the triage vital signs and the nursing notes. Patient presents with otalgia and exam consistent with acute otitis media. No concern for acute mastoiditis, meningitis. No antibiotic use in the last month.  Patient d/c home with Amoxicillin. Advised patient to follow-up in one week with her PCP for recheck.  I have also discussed reasons to return immediately to the ER.  Patient  expresses understanding and  agrees with plan.    Final Clinical Impressions(s) / ED Diagnoses   Final diagnoses:  Acute otitis media, right    ED Discharge Orders         Ordered    amoxicillin (AMOXIL) 500 MG capsule  3 times daily     02/06/18 [address] [place], [place] 02/06/18 1712    Daleen Bo, MD 02/07/18 1535

## 2018-02-06 NOTE — ED Triage Notes (Signed)
Pt in c/o right sided earache since last night, no distress noted

## 2018-02-06 NOTE — Discharge Instructions (Addendum)
Take 400 mg of ibuprofen every 4 to 6 hours as needed for pain. You can use tyelnol in between times if needed. Follow up with your doctor for recheck in one week. Return as needed for worsening symptoms.

## 2018-02-06 NOTE — ED Notes (Signed)
Pt stable and ambulatory for discharge, states understanding follow up.  

## 2018-05-11 ENCOUNTER — Emergency Department (HOSPITAL_COMMUNITY)
Admission: EM | Admit: 2018-05-11 | Discharge: 2018-05-11 | Disposition: A | Discharge status: Home / Self Care | Payer: Medicaid Other | Attending: Emergency Medicine | Admitting: Emergency Medicine

## 2018-05-11 DIAGNOSIS — R519 Headache, unspecified: Secondary | ICD-10-CM

## 2018-05-11 DIAGNOSIS — R51 Headache: Secondary | ICD-10-CM | POA: Insufficient documentation

## 2018-05-11 DIAGNOSIS — Z79899 Other long term (current) drug therapy: Secondary | ICD-10-CM | POA: Insufficient documentation

## 2018-05-11 MED ORDER — PROCHLORPERAZINE MALEATE 10 MG PO TABS: 10.0000 mg | ORAL_TABLET | Freq: Two times a day (BID) | ORAL | 0 refills | Status: DC | PRN

## 2018-05-11 MED ORDER — SODIUM CHLORIDE 0.9 % IV BOLUS
1000.0000 mL | Freq: Once | INTRAVENOUS | Status: AC
  Administered 2018-05-11: 1000 mL via INTRAVENOUS

## 2018-05-11 MED ORDER — DEXAMETHASONE SODIUM PHOSPHATE 10 MG/ML IJ SOLN
10.0000 mg | Freq: Once | INTRAMUSCULAR | Status: AC
  Administered 2018-05-11: 10 mg via INTRAVENOUS
  Filled 2018-05-11: qty 1

## 2018-05-11 MED ORDER — PROCHLORPERAZINE EDISYLATE 10 MG/2ML IJ SOLN
10.0000 mg | Freq: Once | INTRAMUSCULAR | Status: AC
  Administered 2018-05-11: 10 mg via INTRAVENOUS
  Filled 2018-05-11: qty 2

## 2018-05-11 MED ORDER — DIPHENHYDRAMINE HCL 50 MG/ML IJ SOLN
25.0000 mg | Freq: Once | INTRAMUSCULAR | Status: AC
  Administered 2018-05-11: 25 mg via INTRAVENOUS
  Filled 2018-05-11: qty 1

## 2018-05-11 MED ORDER — KETOROLAC TROMETHAMINE 30 MG/ML IJ SOLN
30.0000 mg | Freq: Once | INTRAMUSCULAR | Status: AC
  Administered 2018-05-11: 30 mg via INTRAVENOUS
  Filled 2018-05-11: qty 1

## 2018-05-11 NOTE — ED Notes (Signed)
Patient verbalizes understanding of discharge instructions. Opportunity for questioning and answers were provided. Armband removed by staff, pt discharged from ED. Pt ambulatory to lobby.  

## 2018-05-11 NOTE — ED Triage Notes (Signed)
Pt arrives POV for eval of posterior HA, denies N/V, endorses mild photosensitivity. Pt reports hx of migraines, states hx of HA when pregnant, denies chance of pregnancy at this time.

## 2018-05-11 NOTE — ED Provider Notes (Signed)
[facility] EMERGENCY DEPARTMENT Provider Note   CSN: [id number] Arrival date & time: 05/11/18  1554     History   Chief Complaint Chief Complaint  Patient presents with  . Headache    HPI [name] is a 28 y.o. female.  The history is provided by the patient and medical records. No language interpreter was used.  Headache  Pain location:  Occipital Quality:  Dull Radiates to:  Does not radiate Severity currently:  10/10 Severity at highest:  10/10 Onset quality:  Gradual Duration:  4 weeks Timing:  Intermittent Progression:  Waxing and waning Chronicity:  Recurrent Similar to prior headaches: yes   Context: bright light and loud noise   Relieved by:  NSAIDs Worsened by:  Nothing Ineffective treatments:  None tried Associated symptoms: photophobia   Associated symptoms: no abdominal pain, no back pain, no blurred vision, no congestion, no cough, no diarrhea, no dizziness, no facial pain, no fatigue, no fever, no focal weakness, no hearing loss, no loss of balance, no myalgias, no nausea, no neck pain, no neck stiffness, no numbness, no paresthesias, no seizures, no sore throat, no URI, no visual change, no vomiting and no weakness     Past Medical History:  Diagnosis Date  . Fibroid   . GERD (gastroesophageal reflux disease)   . Gestational diabetes   . Headache   . Normal labor 10/11/2014    Patient Active Problem List   Diagnosis Date Noted  . Cholestasis during pregnancy in third trimester 06/25/2017  . Vaginal delivery 06/25/2017  . Gestational diabetes 06/25/2017  . Supervision of other normal pregnancy, antepartum 01/05/2017  . History of preterm labor, current pregnancy, first trimester 11/08/2016  . H/O rapid labor 05/22/2014    Past Surgical History:  Procedure Laterality Date  . NO PAST SURGERIES       OB History    Gravida  5   Para  4   Term  3   Preterm  1   AB  1   Living  4     SAB  1   TAB        Ectopic      Multiple  0   Live Births  4            Home Medications    Prior to Admission medications   Medication Sig Start Date End Date Taking? Authorizing Provider  amoxicillin (AMOXIL) 500 MG capsule Take 1 capsule (500 mg total) by mouth 3 (three) times daily. 02/06/18   Ashley Murrain, NP  norethindrone (MICRONOR,CAMILA,ERRIN) 0.35 MG tablet Take 1 tablet by mouth daily.     [provider]  Prenatal Vit-Fe Fumarate-FA (PRENATAL MULTIVITAMIN) TABS tablet Take 1 tablet by mouth daily.     [provider]    Family History Family History  Problem Relation Age of Onset  . Diabetes Mother   . Heart disease Mother     Social History Social History   Tobacco Use  . Smoking status: Never Smoker  . Smokeless tobacco: Never Used  Substance Use Topics  . Alcohol use: No  . Drug use: No     Allergies   Patient has no known allergies.   Review of Systems Review of Systems  Constitutional: Negative for chills, diaphoresis, fatigue and fever.  HENT: Negative for congestion, hearing loss and sore throat.   Eyes: Positive for photophobia. Negative for blurred vision and visual disturbance.  Respiratory: Negative for cough, chest  tightness, shortness of breath, wheezing and stridor.   Cardiovascular: Negative for chest pain, palpitations and leg swelling.  Gastrointestinal: Negative for abdominal pain, constipation, diarrhea, nausea and vomiting.  Genitourinary: Negative for dysuria.  Musculoskeletal: Negative for back pain, myalgias, neck pain and neck stiffness.  Neurological: Positive for headaches. Negative for dizziness, focal weakness, seizures, syncope, facial asymmetry, speech difficulty, weakness, light-headedness, numbness, paresthesias and loss of balance.  Psychiatric/Behavioral: Negative for agitation.  All other systems reviewed and are negative.    Physical Exam Updated Vital Signs BP 130/83 (BP Location: Right Arm)   Pulse  74   Temp 98.3 F (36.8 C)   Resp 18   SpO2 98%   Physical Exam Vitals signs and nursing note reviewed.  Constitutional:      General: She is not in acute distress.    Appearance: She is well-developed. She is not ill-appearing, toxic-appearing or diaphoretic.  HENT:     Head: Normocephalic and atraumatic.     Mouth/Throat:     Mouth: Mucous membranes are moist.  Eyes:     General: No visual field deficit or scleral icterus.    Extraocular Movements: Extraocular movements intact.     Right eye: Normal extraocular motion.     Left eye: Normal extraocular motion.     Conjunctiva/sclera: Conjunctivae normal.     Pupils: Pupils are equal, round, and reactive to light. Pupils are equal.  Neck:     Musculoskeletal: Normal range of motion and neck supple. No neck rigidity.  Cardiovascular:     Rate and Rhythm: Normal rate and regular rhythm.     Heart sounds: No murmur.  Pulmonary:     Effort: Pulmonary effort is normal. No respiratory distress.     Breath sounds: Normal breath sounds. No wheezing, rhonchi or rales.  Chest:     Chest wall: No tenderness.  Abdominal:     Palpations: Abdomen is soft.     Tenderness: There is no abdominal tenderness.  Musculoskeletal:        General: No swelling or tenderness.  Lymphadenopathy:     Cervical: No cervical adenopathy.  Skin:    General: Skin is warm and dry.  Neurological:     Mental Status: She is alert.     Cranial Nerves: No cranial nerve deficit, dysarthria or facial asymmetry.     Sensory: No sensory deficit.     Motor: No weakness.     Coordination: Coordination normal.     Gait: Gait normal.  Psychiatric:        Mood and Affect: Mood normal. Mood is not anxious.      ED Treatments / Results  Labs (all labs ordered are listed, but only abnormal results are displayed) Labs Reviewed - No data to display  EKG None  Radiology No results found.  Procedures Procedures (including critical care time)  Medications  Ordered in ED Medications  sodium chloride 0.9 % bolus 1,000 mL (0 mLs Intravenous Stopped 05/11/18 1845)  ketorolac (TORADOL) 30 MG/ML injection 30 mg (30 mg Intravenous Given 05/11/18 1727)  prochlorperazine (COMPAZINE) injection 10 mg (10 mg Intravenous Given 05/11/18 1731)  diphenhydrAMINE (BENADRYL) injection 25 mg (25 mg Intravenous Given 05/11/18 1726)  dexamethasone (DECADRON) injection 10 mg (10 mg Intravenous Given 05/11/18 1729)     Initial Impression / Assessment and Plan / ED Course  I have reviewed the triage vital signs and the nursing notes.  Pertinent labs & imaging results that were available during my care of the  patient were reviewed by me and considered in my medical decision making (see chart for details).     SEMA STANGLER is a 28 y.o. female with a past medical history significant for headaches and migraines who presents with headache.  Patient reports that she is having headaches for the last few weeks on and off.  She reports that the headache is in her occiput.  She reports some photophobia and phonophobia.  She denies significant nausea vomiting constipation diarrhea, vision changes, numbness, tingling, or weakness.  She reports that she is on her menstrual cycle and is not pregnant.  She denies any other complaints including no new medication use.  She reports the pain is severe.  She denies any significant neck stiffness.  No recent neck injury, chiropractor use, or neck twisting.  On exam, no focal neurologic deficits.  Normal sensation strength and coordination.  Normal gait.  Normal pupils.  Normal extraocular movements.  No facial droop.  Normal speech.  Lungs clear chest nontender.  Abdomen nontender.  Normal neck range of motion.  No neck tenderness.  Clinic visit patient is having evolution of her chronic headaches and migraines.  She reports that she had head CT and neck CT years ago when she was pregnant and it was reassuring.  Care decision  conversation held with patient and we decided to try treating her headache as if it is a migraine initially and see if she gets to feeling better.  If headache is improved, will hold on any imaging at this time.  If headache is not improved, would consider CTA given the occipital pain to look for dissection versus CTV to look for a venous sinus thrombosis.  Low suspicion for meningitis based on story.  Anticipate discharge if headache is improved after initial headache cocktail.  Headache significant improved after cocktail.  Patient is feeling much better.  Patient was reassessed and she still had no focal neurologic deficits.  Do not feel she needs CT or MR imaging at this time.  Patient scheduled follow-up with PCP in several days.  Patient was understanding return precautions and plan of care.  Patient had no recurrence or concerns and was discharged in good condition with improved headache.   Final Clinical Impressions(s) / ED Diagnoses   Final diagnoses:  None    ED Discharge Orders    None     Clinical Impression: 1. Bad headache     Disposition: Discharge  Condition: Good  I have discussed the results, Dx and Tx plan with the pt(& family if present). He/she/they expressed understanding and agree(s) with the plan. Discharge instructions discussed at great length. Strict return precautions discussed and pt &/or family have verbalized understanding of the instructions. No further questions at time of discharge.    New Prescriptions   PROCHLORPERAZINE (COMPAZINE) 10 MG TABLET    Take 1 tablet (10 mg total) by mouth 2 (two) times daily as needed for nausea or vomiting.    Follow Up: [place] [address] [id number] [place] [phone]    Your PCP on monday as scheduled        Tasfia Vasseur, Gwenyth Allegra, MD 05/11/18 [phone]

## 2018-05-11 NOTE — Discharge Instructions (Signed)
Your history and exam today were overall reassuring.  You had no focal neurologic deficits on exam and after our shared decision-making conversation, we decided to hold on any CT or MR imaging at this time.  We instead elected to do a cocktail headache medications which significantly of your symptoms.  Given your improvement and your well appearance, we feel you are safe for outpatient follow-up with your primary doctor in several days.  Please use the Compazine to help with nausea and headache.  If you get the side effects we discussed, please use some Benadryl.  If any symptoms change or worsen or he develop any new symptoms that we discussed, please return to the nearest emergency department.

## 2018-09-26 ENCOUNTER — Other Ambulatory Visit: Payer: Self-pay

## 2018-09-26 ENCOUNTER — Encounter (HOSPITAL_COMMUNITY): Payer: Self-pay | Admitting: Emergency Medicine

## 2018-09-26 ENCOUNTER — Emergency Department (HOSPITAL_COMMUNITY): Payer: Medicaid Other

## 2018-09-26 ENCOUNTER — Emergency Department (HOSPITAL_COMMUNITY)
Admission: EM | Admit: 2018-09-26 | Discharge: 2018-09-26 | Disposition: A | Discharge status: Home / Self Care | Payer: Medicaid Other | Attending: Emergency Medicine | Admitting: Emergency Medicine

## 2018-09-26 DIAGNOSIS — Z79899 Other long term (current) drug therapy: Secondary | ICD-10-CM | POA: Diagnosis not present

## 2018-09-26 DIAGNOSIS — R0789 Other chest pain: Secondary | ICD-10-CM | POA: Insufficient documentation

## 2018-09-26 LAB — BASIC METABOLIC PANEL WITH GFR
Anion gap: 9 (ref 5–15)
BUN: 10 mg/dL (ref 6–20)
CO2: 24 mmol/L (ref 22–32)
Calcium: 9.1 mg/dL (ref 8.9–10.3)
Chloride: 105 mmol/L (ref 98–111)
Creatinine, Ser: 0.55 mg/dL (ref 0.44–1.00)
GFR calc Af Amer: >60 mL/min
GFR calc non Af Amer: >60 mL/min
Glucose, Bld: 156 mg/dL — ABNORMAL HIGH (ref 70–99)
Potassium: 4 mmol/L (ref 3.5–5.1)
Sodium: 138 mmol/L (ref 135–145)

## 2018-09-26 LAB — I-STAT BETA HCG BLOOD, ED (MC, WL, AP ONLY): I-stat hCG, quantitative: <5 m[IU]/mL

## 2018-09-26 LAB — CBC
HCT: 41.5 % (ref 36.0–46.0)
Hemoglobin: 14 g/dL (ref 12.0–15.0)
MCH: 31 pg (ref 26.0–34.0)
MCHC: 33.7 g/dL (ref 30.0–36.0)
MCV: 91.8 fL (ref 80.0–100.0)
Platelets: 228 10*3/uL (ref 150–400)
RBC: 4.52 MIL/uL (ref 3.87–5.11)
RDW: 12.5 % (ref 11.5–15.5)
WBC: 6.4 10*3/uL (ref 4.0–10.5)
nRBC: 0 % (ref 0.0–0.2)

## 2018-09-26 LAB — TROPONIN I: Troponin I: <0.03 ng/mL (ref <0.03)

## 2018-09-26 MED ORDER — METHOCARBAMOL 500 MG PO TABS
500.0000 mg | ORAL_TABLET | Freq: Once | ORAL | Status: AC
  Administered 2018-09-26: 500 mg via ORAL
  Filled 2018-09-26: qty 1

## 2018-09-26 MED ORDER — METHOCARBAMOL 500 MG PO TABS: 500.0000 mg | ORAL_TABLET | Freq: Two times a day (BID) | ORAL | 0 refills | Status: AC

## 2018-09-26 MED ORDER — SODIUM CHLORIDE 0.9% FLUSH
3.0000 mL | Freq: Once | INTRAVENOUS | Status: AC
  Administered 2018-09-26: 3 mL via INTRAVENOUS

## 2018-09-26 MED ORDER — KETOROLAC TROMETHAMINE 15 MG/ML IJ SOLN
15.0000 mg | Freq: Once | INTRAMUSCULAR | Status: AC
  Administered 2018-09-26: 15 mg via INTRAMUSCULAR
  Filled 2018-09-26: qty 1

## 2018-09-26 NOTE — ED Provider Notes (Signed)
[facility] EMERGENCY DEPARTMENT Provider Note   CSN: [id number] Arrival date & time: 09/26/18  1424    History   Chief Complaint Chief Complaint  Patient presents with  . Chest Pain    HPI [name] is a 28 y.o. female presenting today for chest pain that began around 10pm last night. Patient reports that she was cooking and mixing batter in a bowl then reached for the fridge when she felt a small pain in her left chest. Patient reports that she then ate food and pain began to increase. Patient describes a sharp tightness to left chest wall that was moderate in intensity. Patient reports she was then evaluated by EMS at home last night and received 324mg  aspirin and had EKG performed which was apparently reassuring and then was not transported to ED. Patient reports that after EMS left she took ibuprofen and performed left arm stretching and had improvement of pain. Patient reports that when she first woke up this morning approx 7 she was pain free but upon attempting to turn to get out of bed her pain returned. She reports in has been constant since onset moderate in intensity sharp tight feeling worsened with palpation and arm movement and improved with being still, patient states that with arm movement pain will radiate down her arm.  Patient denies fever/chills, cough/hemoptysis, abdominal pain, n/v/d, dysuria/hematuria, numbness/weakness/tingling or any additional concerns.  Patient denies history of blood clot, hemoptysis, recent injury/surgery or immobilization, extremity pain/swelling or history of cancer. Patient reports previous use of norethindrone, progesterone only birth control, however states she has not used this medication in over 3 months. She denies any other birth control use for the past 3 months.  Additionally GERD is listed on patient's past medical history however patient reports that it is her mother who was diagnosed with GERD and  patient has never had GERD/heartburn prior.    HPI  Past Medical History:  Diagnosis Date  . Fibroid   . GERD (gastroesophageal reflux disease)   . Gestational diabetes   . Headache   . Normal labor 10/11/2014    Patient Active Problem List   Diagnosis Date Noted  . Cholestasis during pregnancy in third trimester 06/25/2017  . Vaginal delivery 06/25/2017  . Gestational diabetes 06/25/2017  . Supervision of other normal pregnancy, antepartum 01/05/2017  . History of preterm labor, current pregnancy, first trimester 11/08/2016  . H/O rapid labor 05/22/2014    Past Surgical History:  Procedure Laterality Date  . NO PAST SURGERIES       OB History    Gravida  5   Para  4   Term  3   Preterm  1   AB  1   Living  4     SAB  1   TAB      Ectopic      Multiple  0   Live Births  4            Home Medications    Prior to Admission medications   Medication Sig Start Date End Date Taking? Authorizing Provider  aspirin 81 MG chewable tablet Chew 324 mg by mouth once as needed (for sudden onset of chest pain).   Yes [provider]  baclofen (LIORESAL) 10 MG tablet Take 10 mg by mouth daily as needed for muscle spasms.  06/16/18  Yes [provider]  ibuprofen (ADVIL) 200 MG tablet Take 200-800 mg by mouth every 8 (eight) hours as  needed (for migraines).   Yes [provider]  Multiple Vitamins-Calcium (ONE-A-DAY WOMENS PO) Take 1 tablet by mouth daily with breakfast.   Yes [provider]  norgestimate-ethinyl estradiol (Burns 28) 0.25-35 MG-MCG tablet Take 1 tablet by mouth See admin instructions. Take 1 tablet by mouth daily as directed to regulate menstrual cycle   Yes [provider]  phentermine (ADIPEX-P) 37.5 MG tablet Take 0.5 tablets by mouth every other day. 08/01/18  Yes [provider]  amoxicillin (AMOXIL) 500 MG capsule Take 1 capsule (500 mg total) by mouth 3 (three) times daily. Patient not  taking: Reported on 09/26/2018 02/06/18   Ashley Murrain, NP  methocarbamol (ROBAXIN) 500 MG tablet Take 1 tablet (500 mg total) by mouth 2 (two) times daily for 7 days. 09/26/18 10/03/18  Nuala Alpha A, PA-C  prochlorperazine (COMPAZINE) 10 MG tablet Take 1 tablet (10 mg total) by mouth 2 (two) times daily as needed for nausea or vomiting. Patient not taking: Reported on 09/26/2018 05/11/18   Tegeler, Gwenyth Allegra, MD    Family History Family History  Problem Relation Age of Onset  . Diabetes Mother   . Heart disease Mother     Social History Social History   Tobacco Use  . Smoking status: Never Smoker  . Smokeless tobacco: Never Used  Substance Use Topics  . Alcohol use: No  . Drug use: No     Allergies   Patient has no known allergies.   Review of Systems Review of Systems  Constitutional: Negative for chills and fever.  Respiratory: Negative.  Negative for cough and shortness of breath.   Cardiovascular: Positive for chest pain. Negative for palpitations and leg swelling.  Gastrointestinal: Negative.  Negative for abdominal pain, diarrhea, nausea and vomiting.  Musculoskeletal: Negative.  Negative for neck pain and neck stiffness.  Neurological: Negative.  Negative for weakness, numbness and headaches.  All other systems reviewed and are negative.  Physical Exam Updated Vital Signs BP 116/67   Pulse (!) 58   Temp 98.1 F (36.7 C) (Oral)   Resp (!) 21   LMP 09/20/2018   SpO2 99%   Physical Exam Constitutional:      General: She is not in acute distress.    Appearance: Normal appearance. She is well-developed. She is not ill-appearing or diaphoretic.  HENT:     Head: Normocephalic and atraumatic.     Right Ear: External ear normal.     Left Ear: External ear normal.     Nose: Nose normal.  Eyes:     General: Vision grossly intact. Gaze aligned appropriately.     Pupils: Pupils are equal, round, and reactive to light.  Neck:     Musculoskeletal: Normal  range of motion.     Trachea: Trachea and phonation normal. No tracheal deviation.  Cardiovascular:     Rate and Rhythm: Normal rate and regular rhythm.     Pulses:          Radial pulses are 2+ on the right side and 2+ on the left side.       Dorsalis pedis pulses are 2+ on the right side and 2+ on the left side.     Heart sounds: Normal heart sounds.  Pulmonary:     Effort: Pulmonary effort is normal. No tachypnea, accessory muscle usage or respiratory distress.     Breath sounds: Normal breath sounds. No decreased breath sounds.  Chest:     Chest wall: Tenderness present. No deformity  or crepitus.       Comments: Pain reproducible to palpation of left chest wall and left arm moment. Abdominal:     General: There is no distension.     Palpations: Abdomen is soft.     Tenderness: There is no abdominal tenderness. There is no guarding or rebound.  Musculoskeletal: Normal range of motion.     Right lower leg: Normal. She exhibits no tenderness and no swelling.     Left lower leg: Normal. She exhibits no tenderness and no swelling.  Skin:    General: Skin is warm and dry.  Neurological:     Mental Status: She is alert.     GCS: GCS eye subscore is 4. GCS verbal subscore is 5. GCS motor subscore is 6.     Comments: Speech is clear and goal oriented, follows commands Major Cranial nerves without deficit, no facial droop Normal strength in upper and lower extremities bilaterally including dorsiflexion and plantar flexion, strong and equal grip strength Sensation normal to light and sharp touch Moves extremities without ataxia, coordination intact Normal gait  Psychiatric:        Behavior: Behavior normal.    ED Treatments / Results  Labs (all labs ordered are listed, but only abnormal results are displayed) Labs Reviewed  BASIC METABOLIC PANEL - Abnormal; Notable for the following components:      Result Value   Glucose, Bld 156 (*)    All other components within normal limits   CBC  TROPONIN I  I-STAT BETA HCG BLOOD, ED ([facility], [facility], [facility] ONLY)    EKG EKG Interpretation  Date/Time:  Tuesday September 26 2018 14:33:04 EDT Ventricular Rate:  76 PR Interval:  136 QRS Duration: 94 QT Interval:  376 QTC Calculation: 423 R Axis:   57 Text Interpretation:  Normal sinus rhythm Biatrial enlargement Abnormal ECG No significant change since last tracing Confirmed by Dorie Rank [phone]) on 09/26/2018 4:06:36 PM   Radiology Dg Chest 2 View  Result Date: 09/26/2018 CLINICAL DATA:  Chest pain EXAM: CHEST - 2 VIEW COMPARISON:  08/17/2011 FINDINGS: The heart size and mediastinal contours are within normal limits. Both lungs are clear. The visualized skeletal structures are unremarkable. IMPRESSION: No active cardiopulmonary disease. Electronically Signed   By: Constance Holster M.D.   On: 09/26/2018 15:49    Procedures Procedures (including critical care time)  Medications Ordered in ED Medications  sodium chloride flush (NS) 0.9 % injection 3 mL (3 mLs Intravenous Given 09/26/18 1557)  ketorolac (TORADOL) 15 MG/ML injection 15 mg (15 mg Intramuscular Given 09/26/18 1627)  methocarbamol (ROBAXIN) tablet 500 mg (500 mg Oral Given 09/26/18 1627)     Initial Impression / Assessment and Plan / ED Course  I have reviewed the triage vital signs and the nursing notes.  Pertinent labs & imaging results that were available during my care of the patient were reviewed by me and considered in my medical decision making (see chart for details).    Pain present for greater than 12 hours consistently reproducible with light palpation of the left chest wall and with movement of the left arm.  Heart regular rate and rhythm without murmur, lungs clear to auscultation bilaterally, abdomen soft nontender without peritoneal signs, distal pulses intact and equal to all 4 extremities with good perfusion, normal neuro exam and no sensory deficits, afebrile, not tachycardic, not hypotensive, SPO2 100% on  room air.  Patient is low risk by Wells criteria and PERC negative.  CBC within normal  limits Beta-hCG negative Troponin negative BMP with glucose 156, nonacute EKG: Normal sinus rhythm Biatrial enlargement Abnormal ECG No significant change since last tracing Confirmed by Dorie Rank  CXR: IMPRESSION: No active cardiopulmonary disease. - As patient's pain is been present for greater than 12 hours and she has a negative troponin here in the emergency department do not suspect ACS as cause of her pain, additionally heart score is less than 4, no indication for delta troponin at this time.  She is low risk by Wells criteria and PERC negative do not suspect pulmonary embolism as cause of pain.  Based on history, physical examination imaging today do not suspect dissection as cause of patient's symptoms.  Do not suspect other acute cardiopulmonary etiology of patient's pain today.  Suspect patient's pain secondary to musculoskeletal strain while she was mixing batter and reaching around her kitchen, it is consistently reproducible with light palpation of the left chest wall and with movement of the left arm.  She has no history of CKD or gastric ulcers, will treat patient with 15 mg Toradol here in ED and begin patient on Robaxin for musculoskeletal strain. - Patient reassessed resting comfortably no acute distress, heart rate 65 bpm, SPO2 100% on room air.  States some improvement in pain following Robaxin and Toradol here in the emergency department.  She has been updated on results today and states understanding.  Patient has been prescribed Robaxin 500 mg twice daily, precautions given to patient regarding muscle relaxers and she states understanding.  Additionally patient advised to avoid further NSAID use for the next 2 days and drink plenty of water and she states understanding. - At this time there does not appear to be any evidence of an acute emergency medical condition and the patient appears  stable for discharge with appropriate outpatient follow up. Diagnosis was discussed with patient who verbalizes understanding of care plan and is agreeable to discharge. I have discussed return precautions with patient  who verbalizes understanding of return precautions. Patient encouraged to follow-up with their PCP. All questions answered.  Patient's case discussed with Dr. Tomi Bamberger who agrees with plan to discharge with follow-up.   Note: Portions of this report may have been transcribed using voice recognition software. Every effort was made to ensure accuracy; however, inadvertent computerized transcription errors may still be present. Final Clinical Impressions(s) / ED Diagnoses   Final diagnoses:  Musculoskeletal chest pain    ED Discharge Orders         Ordered    methocarbamol (ROBAXIN) 500 MG tablet  2 times daily     09/26/18 1653           Gari Crown 09/26/18 1703    Dorie Rank, MD 09/29/18 1031

## 2018-09-26 NOTE — Discharge Instructions (Signed)
You have been diagnosed today with Musculoskeletal Chest Pain.  At this time there does not appear to be the presence of an emergent medical condition, however there is always the potential for conditions to change. Please read and follow the below instructions.  Please return to the Emergency Department immediately for any new or worsening symptoms. Please be sure to follow up with your Primary Care Provider within one week regarding your visit today; please call their office to schedule an appointment even if you are feeling better for a follow-up visit. You have been given a medication today called Toradol this is an NSAID-containing medication.  Do not take any additional NSAIDs including ibuprofen, naproxen, Advil, Aleve for the next 2 days.  Please drink plenty of water.  Additionally you may use the Robaxin as prescribed to help with your musculoskeletal pain.  Do not drive or operate heavy machinery when taking Robaxin as will make you drowsy.  Do not drink alcohol or take other sedating medications or taking Robaxin as this will worsen side effects.  You may also use heating pads and light stretching to help with your muscle spasm. Get help right away if: Your chest pain gets worse. You have a cough that gets worse, or you cough up blood. You have severe pain in your abdomen. You faint. You have sudden, unexplained chest discomfort. You have sudden, unexplained discomfort in your arms, back, neck, or jaw. You have shortness of breath at any time. You suddenly start to sweat, or your skin gets clammy. You feel nausea or you vomit. You suddenly feel lightheaded or dizzy. You have severe weakness, or unexplained weakness or fatigue. Your heart begins to beat quickly, or it feels like it is skipping beats. You feel sick to your stomach (nauseous) or you throw up (vomit). You feel sweaty or light-headed. You have a cough with mucus from your lungs (sputum) or you cough up blood. You are  short of breath. Any new/concerning or worsening symptoms   Please read the additional information packets attached to your discharge summary.  Do not take your medicine if  develop an itchy rash, swelling in your mouth or lips, or difficulty breathing; call 911 and seek immediate emergency medical attention if this occurs.

## 2018-09-26 NOTE — ED Triage Notes (Signed)
P arrives from home with 10/10 chest pain on the left side that goes into left arm and upper back. Pt was seen by ems at home yesterday but did not transport her due to a normal ekg per pt. Pt state the pain got worse this morning.

## 2018-09-26 NOTE — ED Notes (Signed)
Gold and blue top drawn on pt

## 2018-10-23 ENCOUNTER — Other Ambulatory Visit: Payer: Self-pay

## 2018-10-23 ENCOUNTER — Inpatient Hospital Stay (HOSPITAL_COMMUNITY)
Admission: AD | Admit: 2018-10-23 | Discharge: 2018-10-23 | Disposition: A | Discharge status: Home / Self Care | Payer: Medicaid Other | Attending: Obstetrics and Gynecology | Admitting: Obstetrics and Gynecology

## 2018-10-23 ENCOUNTER — Inpatient Hospital Stay (HOSPITAL_COMMUNITY): Payer: Medicaid Other

## 2018-10-23 ENCOUNTER — Encounter (HOSPITAL_COMMUNITY): Payer: Self-pay | Admitting: *Deleted

## 2018-10-23 DIAGNOSIS — R109 Unspecified abdominal pain: Secondary | ICD-10-CM | POA: Insufficient documentation

## 2018-10-23 DIAGNOSIS — Z3A01 Less than 8 weeks gestation of pregnancy: Secondary | ICD-10-CM | POA: Diagnosis not present

## 2018-10-23 DIAGNOSIS — B373 Candidiasis of vulva and vagina: Secondary | ICD-10-CM

## 2018-10-23 DIAGNOSIS — O26891 Other specified pregnancy related conditions, first trimester: Secondary | ICD-10-CM | POA: Insufficient documentation

## 2018-10-23 DIAGNOSIS — O3680X0 Pregnancy with inconclusive fetal viability, not applicable or unspecified: Secondary | ICD-10-CM

## 2018-10-23 DIAGNOSIS — B3731 Acute candidiasis of vulva and vagina: Secondary | ICD-10-CM

## 2018-10-23 DIAGNOSIS — O98811 Other maternal infectious and parasitic diseases complicating pregnancy, first trimester: Secondary | ICD-10-CM | POA: Diagnosis not present

## 2018-10-23 DIAGNOSIS — Z7982 Long term (current) use of aspirin: Secondary | ICD-10-CM | POA: Diagnosis not present

## 2018-10-23 LAB — CBC WITH DIFFERENTIAL/PLATELET
Abs Immature Granulocytes: 0.04 10*3/uL (ref 0.00–0.07)
Basophils Absolute: 0 10*3/uL (ref 0.0–0.1)
Basophils Relative: 0 %
Eosinophils Absolute: 0.1 10*3/uL (ref 0.0–0.5)
Eosinophils Relative: 1 %
HCT: 40.4 % (ref 36.0–46.0)
Hemoglobin: 14 g/dL (ref 12.0–15.0)
Immature Granulocytes: 0 %
Lymphocytes Relative: 12 %
Lymphs Abs: 1.3 10*3/uL (ref 0.7–4.0)
MCH: 31.5 pg (ref 26.0–34.0)
MCHC: 34.7 g/dL (ref 30.0–36.0)
MCV: 90.8 fL (ref 80.0–100.0)
Monocytes Absolute: 0.3 10*3/uL (ref 0.1–1.0)
Monocytes Relative: 3 %
Neutro Abs: 9.3 10*3/uL — ABNORMAL HIGH (ref 1.7–7.7)
Neutrophils Relative %: 84 %
Platelets: 231 10*3/uL (ref 150–400)
RBC: 4.45 MIL/uL (ref 3.87–5.11)
RDW: 12.7 % (ref 11.5–15.5)
WBC: 11.1 10*3/uL — ABNORMAL HIGH (ref 4.0–10.5)
nRBC: 0 % (ref 0.0–0.2)

## 2018-10-23 LAB — URINALYSIS, ROUTINE W REFLEX MICROSCOPIC
Bilirubin Urine: NEGATIVE
Glucose, UA: NEGATIVE mg/dL
Hgb urine dipstick: NEGATIVE
Ketones, ur: NEGATIVE mg/dL
Nitrite: NEGATIVE
Protein, ur: NEGATIVE mg/dL
Specific Gravity, Urine: 1.005 (ref 1.005–1.030)
pH: 6 (ref 5.0–8.0)

## 2018-10-23 LAB — COMPREHENSIVE METABOLIC PANEL
ALT: 77 U/L — ABNORMAL HIGH (ref 0–44)
AST: 28 U/L (ref 15–41)
Albumin: 4 g/dL (ref 3.5–5.0)
Alkaline Phosphatase: 77 U/L (ref 38–126)
Anion gap: 8 (ref 5–15)
BUN: 8 mg/dL (ref 6–20)
CO2: 22 mmol/L (ref 22–32)
Calcium: 8.9 mg/dL (ref 8.9–10.3)
Chloride: 107 mmol/L (ref 98–111)
Creatinine, Ser: 0.67 mg/dL (ref 0.44–1.00)
GFR calc Af Amer: >60 mL/min (ref >60)
GFR calc non Af Amer: >60 mL/min (ref >60)
Glucose, Bld: 129 mg/dL — ABNORMAL HIGH (ref 70–99)
Potassium: 3.6 mmol/L (ref 3.5–5.1)
Sodium: 137 mmol/L (ref 135–145)
Total Bilirubin: 0.7 mg/dL (ref 0.3–1.2)
Total Protein: 7 g/dL (ref 6.5–8.1)

## 2018-10-23 LAB — POCT PREGNANCY, URINE: Preg Test, Ur: POSITIVE — AB

## 2018-10-23 LAB — HCG, QUANTITATIVE, PREGNANCY: hCG, Beta Chain, Quant, S: 655 m[IU]/mL — ABNORMAL HIGH (ref <5)

## 2018-10-23 LAB — WET PREP, GENITAL
Clue Cells Wet Prep HPF POC: NONE SEEN
Sperm: NONE SEEN
Trich, Wet Prep: NONE SEEN
Yeast Wet Prep HPF POC: NONE SEEN

## 2018-10-23 MED ORDER — OXYCODONE-ACETAMINOPHEN 5-325 MG PO TABS
2.0000 | ORAL_TABLET | Freq: Once | ORAL | Status: AC
  Administered 2018-10-23: 2 via ORAL
  Filled 2018-10-23: qty 2

## 2018-10-23 NOTE — MAU Provider Note (Signed)
Chief Complaint: Abdominal Pain and Vaginal Itching   First Provider Initiated Contact with Patient 10/23/18 1929      SUBJECTIVE HPI: [name] is a 28 y.o. [id number] at [redacted]w[redacted]d by LMP who presents to maternity admissions reporting abdominal pain intermittently x 2 months and vaginal itching and pain x 2 days.  She is taking Phentermine diet pills and ibuprofen for her abdominal pain and did not think she was pregnant so is worried about these.  She stopped taking OCPs 2 months ago.  Her periods are usually regular but her last normal menses was 08/25/18, with a lighter than usual period on 09/20/18.  Her abdominal pain is tightening, cramping pain in the center of her low abdomen and it is intermittent.  It is improved when she massages her abdomen and with ibuprofen.  It started 2 months ago and is the has been the same in intensity until 2 days ago. When the vaginal itching started, the pain became worse and now radiates to her low back.  She denies any n/v/d. She denies vaginal bleeding, urinary symptoms, h/a, dizziness, n/v, or fever/chills.     HPI  Past Medical History:  Diagnosis Date  . Fibroid   . GERD (gastroesophageal reflux disease)   . Gestational diabetes   . Headache   . Normal labor 10/11/2014   Past Surgical History:  Procedure Laterality Date  . NO PAST SURGERIES     Social History   Socioeconomic History  . Marital status: Married    Spouse name: Not on file  . Number of children: Not on file  . Years of education: Not on file  . Highest education level: Not on file  Occupational History  . Not on file  Social Needs  . Financial resource strain: Not on file  . Food insecurity    Worry: Not on file    Inability: Not on file  . Transportation needs    Medical: Not on file    Non-medical: Not on file  Tobacco Use  . Smoking status: Never Smoker  . Smokeless tobacco: Never Used  Substance and Sexual Activity  . Alcohol use: No  . Drug use: No  .  Sexual activity: Yes    Birth control/protection: Pill  Lifestyle  . Physical activity    Days per week: Not on file    Minutes per session: Not on file  . Stress: Not on file  Relationships  . Social Herbalist on phone: Not on file    Gets together: Not on file    Attends religious service: Not on file    Active member of club or organization: Not on file    Attends meetings of clubs or organizations: Not on file    Relationship status: Not on file  . Intimate partner violence    Fear of current or ex partner: Not on file    Emotionally abused: Not on file    Physically abused: Not on file    Forced sexual activity: Not on file  Other Topics Concern  . Not on file  Social History Narrative  . Not on file   No current facility-administered medications on file prior to encounter.    Current Outpatient Medications on File Prior to Encounter  Medication Sig Dispense Refill  . aspirin 81 MG chewable tablet Chew 324 mg by mouth once as needed (for sudden onset of chest pain).    Marland Kitchen ibuprofen (ADVIL) 200 MG tablet Take 200-800  mg by mouth every 8 (eight) hours as needed (for migraines).    . phentermine (ADIPEX-P) 37.5 MG tablet Take 0.5 tablets by mouth every other day.    Marland Kitchen amoxicillin (AMOXIL) 500 MG capsule Take 1 capsule (500 mg total) by mouth 3 (three) times daily. (Patient not taking: Reported on 09/26/2018) 30 capsule 0  . baclofen (LIORESAL) 10 MG tablet Take 10 mg by mouth daily as needed for muscle spasms.     . Multiple Vitamins-Calcium (ONE-A-DAY WOMENS PO) Take 1 tablet by mouth daily with breakfast.    . norgestimate-ethinyl estradiol (SPRINTEC 28) 0.25-35 MG-MCG tablet Take 1 tablet by mouth See admin instructions. Take 1 tablet by mouth daily as directed to regulate menstrual cycle    . prochlorperazine (COMPAZINE) 10 MG tablet Take 1 tablet (10 mg total) by mouth 2 (two) times daily as needed for nausea or vomiting. (Patient not taking: Reported on 09/26/2018)  14 tablet 0   No Known Allergies  ROS:  Review of Systems  Constitutional: Negative for chills, fatigue and fever.  Respiratory: Negative for shortness of breath.   Cardiovascular: Negative for chest pain.  Gastrointestinal: Positive for abdominal pain. Negative for constipation, nausea and vomiting.  Genitourinary: Positive for pelvic pain, vaginal discharge and vaginal pain. Negative for difficulty urinating, dysuria, flank pain and vaginal bleeding.  Neurological: Negative for dizziness and headaches.  Psychiatric/Behavioral: Negative.      I have reviewed patient's Past Medical Hx, Surgical Hx, Family Hx, Social Hx, medications and allergies.   Physical Exam   Patient Vitals for the past 24 hrs:  BP Temp Temp src Pulse Resp SpO2 Height Weight  10/23/18 2115 137/66 98.2 F (36.8 C) Oral 97 18 99 % - -  10/23/18 1707 (!) 141/81 99.2 F (37.3 C) Oral (!) 113 20 100 % 5' (1.524 m) 84.2 kg   Constitutional: Well-developed, well-nourished female in no acute distress.  Cardiovascular: normal rate Respiratory: normal effort GI: Abd soft, non-tender. Pos BS x 4 MS: Extremities nontender, no edema, normal ROM Neurologic: Alert and oriented x 4.  GU: Neg CVAT.  PELVIC EXAM: Cervix pink, visually closed, without lesion, moderate amount thick curd-like vaginal discharge, vaginal walls and external genitalia normal Bimanual exam: Cervix 0/long/high, firm, anterior, neg CMT, uterus tender, nonenlarged, adnexa without tenderness, enlargement, or mass   LAB RESULTS Results for orders placed or performed during the hospital encounter of 10/23/18 (from the past 24 hour(s))  Pregnancy, urine POC     Status: Abnormal   Collection Time: 10/23/18  5:12 PM  Result Value Ref Range   Preg Test, Ur POSITIVE (A) NEGATIVE  CBC with Differential/Platelet     Status: Abnormal   Collection Time: 10/23/18  5:41 PM  Result Value Ref Range   WBC 11.1 (H) 4.0 - 10.5 K/uL   RBC 4.45 3.87 - 5.11  MIL/uL   Hemoglobin 14.0 12.0 - 15.0 g/dL   HCT 40.4 36.0 - 46.0 %   MCV 90.8 80.0 - 100.0 fL   MCH 31.5 26.0 - 34.0 pg   MCHC 34.7 30.0 - 36.0 g/dL   RDW 12.7 11.5 - 15.5 %   Platelets 231 150 - 400 K/uL   nRBC 0.0 0.0 - 0.2 %   Neutrophils Relative % 84 %   Neutro Abs 9.3 (H) 1.7 - 7.7 K/uL   Lymphocytes Relative 12 %   Lymphs Abs 1.3 0.7 - 4.0 K/uL   Monocytes Relative 3 %   Monocytes Absolute 0.3 0.1 - 1.0  K/uL   Eosinophils Relative 1 %   Eosinophils Absolute 0.1 0.0 - 0.5 K/uL   Basophils Relative 0 %   Basophils Absolute 0.0 0.0 - 0.1 K/uL   Immature Granulocytes 0 %   Abs Immature Granulocytes 0.04 0.00 - 0.07 K/uL  Comprehensive metabolic panel     Status: Abnormal   Collection Time: 10/23/18  5:41 PM  Result Value Ref Range   Sodium 137 135 - 145 mmol/L   Potassium 3.6 3.5 - 5.1 mmol/L   Chloride 107 98 - 111 mmol/L   CO2 22 22 - 32 mmol/L   Glucose, Bld 129 (H) 70 - 99 mg/dL   BUN 8 6 - 20 mg/dL   Creatinine, Ser 0.67 0.44 - 1.00 mg/dL   Calcium 8.9 8.9 - 10.3 mg/dL   Total Protein 7.0 6.5 - 8.1 g/dL   Albumin 4.0 3.5 - 5.0 g/dL   AST 28 15 - 41 U/L   ALT 77 (H) 0 - 44 U/L   Alkaline Phosphatase 77 38 - 126 U/L   Total Bilirubin 0.7 0.3 - 1.2 mg/dL   GFR calc non Af Amer >60 >60 mL/min   GFR calc Af Amer >60 >60 mL/min   Anion gap 8 5 - 15  hCG, quantitative, pregnancy     Status: Abnormal   Collection Time: 10/23/18  5:41 PM  Result Value Ref Range   hCG, Beta Chain, Quant, S 655 (H) <5 mIU/mL  Urinalysis, Routine w reflex microscopic     Status: Abnormal   Collection Time: 10/23/18  5:54 PM  Result Value Ref Range   Color, Urine STRAW (A) YELLOW   APPearance CLEAR CLEAR   Specific Gravity, Urine 1.005 1.005 - 1.030   pH 6.0 5.0 - 8.0   Glucose, UA NEGATIVE NEGATIVE mg/dL   Hgb urine dipstick NEGATIVE NEGATIVE   Bilirubin Urine NEGATIVE NEGATIVE   Ketones, ur NEGATIVE NEGATIVE mg/dL   Protein, ur NEGATIVE NEGATIVE mg/dL   Nitrite NEGATIVE NEGATIVE    Leukocytes,Ua SMALL (A) NEGATIVE   RBC / HPF 0-5 0 - 5 RBC/hpf   WBC, UA 6-10 0 - 5 WBC/hpf   Bacteria, UA RARE (A) NONE SEEN   Squamous Epithelial / LPF 0-5 0 - 5  Wet prep, genital     Status: Abnormal   Collection Time: 10/23/18  7:26 PM  Result Value Ref Range   Yeast Wet Prep HPF POC NONE SEEN NONE SEEN   Trich, Wet Prep NONE SEEN NONE SEEN   Clue Cells Wet Prep HPF POC NONE SEEN NONE SEEN   WBC, Wet Prep HPF POC MANY (A) NONE SEEN   Sperm NONE SEEN        IMAGING Dg Chest 2 View  Result Date: 09/26/2018 CLINICAL DATA:  Chest pain EXAM: CHEST - 2 VIEW COMPARISON:  08/17/2011 FINDINGS: The heart size and mediastinal contours are within normal limits. Both lungs are clear. The visualized skeletal structures are unremarkable. IMPRESSION: No active cardiopulmonary disease. Electronically Signed   By: Constance Holster M.D.   On: 09/26/2018 15:49   US Ob Less Than 14 Weeks With Ob Transvaginal  Result Date: 10/23/2018 CLINICAL DATA:  Lower pelvic pain with positive pregnancy test EXAM: OBSTETRIC <14 WK [place] AND TRANSVAGINAL OB US TECHNIQUE: Both transabdominal and transvaginal ultrasound examinations were performed for complete evaluation of the gestation as well as the maternal uterus, adnexal regions, and pelvic cul-de-sac. Transvaginal technique was performed to assess early pregnancy. COMPARISON:  None. FINDINGS: Intrauterine gestational  sac: Absent Maternal uterus/adnexae: No intrauterine or extra uterine gestational sac is noted. This is likely related to the extremely low beta HCG. No free fluid is noted. The ovaries as visualized are within normal limits. IMPRESSION: No gestational sac is identified at this time. Correlation with beta HCG levels is recommended. Electronically Signed   By: Inez Catalina M.D.   On: 10/23/2018 20:12    MAU Management/MDM: Orders Placed This Encounter  Procedures  . Wet prep, genital  . US OB LESS THAN 14 WEEKS WITH OB TRANSVAGINAL  . CBC with  Differential/Platelet  . Comprehensive metabolic panel  . hCG, quantitative, pregnancy  . Urinalysis, Routine w reflex microscopic  . Pregnancy, urine POC  . Discharge patient    Meds ordered this encounter  Medications  . oxyCODONE-acetaminophen (PERCOCET/ROXICET) 5-325 MG per tablet 2 tablet    Findings today could represent a normal early pregnancy, spontaneous abortion or ectopic pregnancy which can be life-threatening.  Ectopic precautions were given to the patient with plan to return in 48 hours for repeat quant hcg to evaluate pregnancy development. Consult Dr Rosana Hoes regarding elevated ALT.  No emergency concerns but pt needs to follow up outpatient. Called [facility] first call midwife, Ranee Gosselin, who will set up follow up with [facility] for stat hcg on 10/25/18.  Pt to return to MAU with worsening symptoms.  Pt discharged with strict ectopic precautions.  ASSESSMENT 1. Pregnancy of unknown anatomic location   2. Abdominal pain in pregnancy, first trimester   3. Vagina, candidiasis     PLAN Discharge home Allergies as of 10/23/2018   No Known Allergies     Medication List    STOP taking these medications   amoxicillin 500 MG capsule Commonly known as: AMOXIL   aspirin 81 MG chewable tablet   baclofen 10 MG tablet Commonly known as: LIORESAL   ibuprofen 200 MG tablet Commonly known as: ADVIL   phentermine 37.5 MG tablet Commonly known as: ADIPEX-P   prochlorperazine 10 MG tablet Commonly known as: COMPAZINE   Sprintec 28 0.25-35 MG-MCG tablet Generic drug: norgestimate-ethinyl estradiol     TAKE these medications   ONE-A-DAY WOMENS PO Take 1 tablet by mouth daily with breakfast.      [facility] Follow up.   Specialty: Obstetrics and Gynecology Why: On Wednesday, 10/25/18. The office will call you to set up lab appointment. Return to MAU as needed for emergencies. Contact information: [address]. [address] [id number] [phone]          Fatima Blank Certified Nurse-Midwife 10/23/2018  9:26 PM

## 2018-10-23 NOTE — MAU Note (Signed)
Has been having pain in lower abd, like a tightening and a cramp. Has been having those 'muscle pains' for 3 wks, this pain just started today.  Having itching inside vagina, started on Sat., now is burning.  No d/c or odor.  Had a little bit of cream left over - tried it last night ( ? Monistat). Doesn't think she is pregnant.

## 2018-10-24 ENCOUNTER — Other Ambulatory Visit: Payer: Self-pay | Admitting: Advanced Practice Midwife

## 2018-10-24 LAB — GC/CHLAMYDIA PROBE AMP (~~LOC~~) NOT AT ARMC
Chlamydia: NEGATIVE
Neisseria Gonorrhea: NEGATIVE

## 2018-10-24 MED ORDER — TERCONAZOLE 0.4 % VA CREA: 1.0000 | TOPICAL_CREAM | Freq: Every day | VAGINAL | 0 refills | Status: AC

## 2018-10-24 NOTE — Progress Notes (Signed)
Patient called, and states that the rx for yeast infection cream was not at pharmacy. I do not see that the order was placed. Re-sending for her now.   Marcille Buffy DNP, CNM  10/24/18  12:47 PM

## 2018-10-26 ENCOUNTER — Inpatient Hospital Stay (HOSPITAL_COMMUNITY)
Admission: AD | Admit: 2018-10-26 | Discharge: 2018-10-26 | Disposition: A | Discharge status: Home / Self Care | Payer: Medicaid Other | Attending: Obstetrics & Gynecology | Admitting: Obstetrics & Gynecology

## 2018-10-26 ENCOUNTER — Other Ambulatory Visit: Payer: Self-pay

## 2018-10-26 ENCOUNTER — Encounter (HOSPITAL_COMMUNITY): Payer: Self-pay | Admitting: *Deleted

## 2018-10-26 ENCOUNTER — Inpatient Hospital Stay (HOSPITAL_COMMUNITY): Payer: Medicaid Other

## 2018-10-26 DIAGNOSIS — O26891 Other specified pregnancy related conditions, first trimester: Secondary | ICD-10-CM | POA: Diagnosis present

## 2018-10-26 DIAGNOSIS — R109 Unspecified abdominal pain: Secondary | ICD-10-CM | POA: Diagnosis not present

## 2018-10-26 DIAGNOSIS — Z3A01 Less than 8 weeks gestation of pregnancy: Secondary | ICD-10-CM

## 2018-10-26 DIAGNOSIS — R102 Pelvic and perineal pain: Secondary | ICD-10-CM | POA: Insufficient documentation

## 2018-10-26 LAB — HCG, QUANTITATIVE, PREGNANCY: hCG, Beta Chain, Quant, S: 3111 m[IU]/mL — ABNORMAL HIGH (ref <5)

## 2018-10-26 MED ORDER — ACETAMINOPHEN 325 MG PO TABS
650.0000 mg | ORAL_TABLET | Freq: Once | ORAL | Status: AC
  Administered 2018-10-26: 650 mg via ORAL
  Filled 2018-10-26: qty 2

## 2018-10-26 NOTE — Discharge Instructions (Signed)
Human Chorionic Gonadotropin Test Why am I having this test? A human chorionic gonadotropin (hCG) test is done to determine whether you are pregnant. It can also be used:  To diagnose an abnormal pregnancy.  To determine whether you have had a failed pregnancy (miscarriage) or are at risk of one. What is being tested? This test checks the level of the human chorionic gonadotropin (hCG) hormone in the blood. This hormone is produced during pregnancy by the cells that form the placenta. The placenta is the organ that grows inside your womb (uterus) to nourish a developing baby. When you are pregnant, hCG can be detected in your blood or urine 7 to 8 days before your missed period. It continues to go up for the first 8-10 weeks of pregnancy. The presence of hCG in your blood can be measured with several different types of tests. You may have:  A urine test. ? Because this hormone is eliminated from your body by your kidneys, you may have a urine test to find out whether you are pregnant. A home pregnancy test detects whether there is hCG in your urine. ? A urine test only shows whether there is hCG in your urine. It does not measure how much.  A qualitative blood test. ? You may have this type of blood test to find out if you are pregnant. ? This blood test only shows whether there is hCG in your blood. It does not measure how much.  A quantitative blood test. ? This type of blood test measures the amount of hCG in your blood. ? You may have this test to:  Diagnose an abnormal pregnancy.  Check whether you have had a miscarriage.  Determine whether you are at risk of a miscarriage. What kind of sample is taken?     Two kinds of samples may be collected to test for the hCG hormone.  Blood. It is usually collected by inserting a needle into a blood vessel.  Urine. It is usually collected by urinating into a germ-free (sterile) specimen cup. It is best to collect the sample the first  time you urinate in the morning. How do I prepare for this test? No preparation is needed for a blood test.  For the urine test:  Let your health care provider know about: ? All medicines you are taking, including vitamins, herbs, creams, and over-the-counter medicines. ? Any blood in your urine. This may interfere with the result.  Do not drink too much fluid. Drink as you normally would, or as directed by your health care provider. How are the results reported? Depending on the type of test that you have, your test results may be reported as values. Your health care provider will compare your results to normal ranges that were established after testing a large group of people (reference ranges). Reference ranges may vary among labs and hospitals. For this test, common reference ranges that show absence of pregnancy are:  Quantitative hCG blood levels: less than 5 IU/L. Other results will be reported as either positive or negative. For this test, normal results (meaning the absence of pregnancy) are:  Negative for hCG in the urine test.  Negative for hCG in the qualitative blood test. What do the results mean? Urine and qualitative blood test  A negative result could mean: ? That you are not pregnant. ? That the test was done too early in your pregnancy to detect hCG in your blood or urine. If you still have other signs   of pregnancy, the test will be repeated.  A positive result means: ? That you are most likely pregnant. Your health care provider may confirm your pregnancy with an imaging study (ultrasound) of your uterus, if needed. Quantitative blood test Results of the quantitative hCG blood test will be interpreted as follows:  Less than 5 IU/L: You are most likely not pregnant.  Greater than 25 IU/L: You are most likely pregnant.  hCG levels that are higher than expected: ? You are pregnant with twins. ? You have abnormal growths in the uterus.  hCG levels that are  rising more slowly than expected: ? You have an ectopic pregnancy (also called a tubal pregnancy).  hCG levels that are falling: ? You may be having a miscarriage. Talk with your health care provider about what your results mean. Questions to ask your health care provider Ask your health care provider, or the department that is doing the test:  When will my results be ready?  How will I get my results?  What are my treatment options?  What other tests do I need?  What are my next steps? Summary  A human chorionic gonadotropin test is done to determine whether you are pregnant.  When you are pregnant, hCG can be detected in your blood or urine 7 to 8 days before your missed period. It continues to go up for the first 8-10 weeks of pregnancy.  Your hCG level can be measured with different types of tests. You may have a urine test, a qualitative blood test, or a quantitative blood test.  Talk with your health care provider about what your results mean. This information is not intended to replace advice given to you by your health care provider. Make sure you discuss any questions you have with your health care provider. Document Released: 04/30/2004 Document Revised: 02/28/2017 Document Reviewed: 02/28/2017 Elsevier Patient Education  2020 [employer].

## 2018-10-26 NOTE — MAU Provider Note (Signed)
History     CSN: [id number]  Arrival date and time: 10/26/18 1551   First Provider Initiated Contact with Patient 10/26/18 1712      Chief Complaint  Patient presents with  . Abdominal Pain   HPI [name] is a 28 y.o. [id number] at [redacted]w[redacted]d by LMP who presents to MAU with chief complaint of pelvic pain in the setting of history of ectopic pregnancy. This is a recurring problem, onset two months ago.  She was previously seen in MAU 07/13 for ectopic pregnancy workup. She had a follow-up quant in clinic yesterday and was told her quant had risen from 655 to 1600 between 07/13 and 07/15. However, patient states her pelvic pain has never improved and "feels just like it did when I had my miscarriage".  She endorses bilateral lower abdominal pain which waxes and wanes throughout the day. She rates her pain as 10/10 upon arrival in MAU. She has not taken medication or tried other treatments for this complaint.   She denies dysuria, vaginal bleeding, abdominal tenderness. She states she vaginal itching she had during her 10/23/18 MAU visit has also resolved.   OB History    Gravida  6   Para  4   Term  3   Preterm  1   AB  1   Living  4     SAB  1   TAB      Ectopic      Multiple  0   Live Births  4           Past Medical History:  Diagnosis Date  . Fibroid   . GERD (gastroesophageal reflux disease)   . Gestational diabetes    with 4th preg  . Headache   . Normal labor 10/11/2014    Past Surgical History:  Procedure Laterality Date  . NO PAST SURGERIES      Family History  Problem Relation Age of Onset  . Diabetes Mother   . Heart disease Mother        mitral valve replaced    Social History   Tobacco Use  . Smoking status: Never Smoker  . Smokeless tobacco: Never Used  Substance Use Topics  . Alcohol use: No  . Drug use: No    Allergies: No Known Allergies  Medications Prior to Admission  Medication Sig Dispense Refill Last Dose  .  Multiple Vitamins-Calcium (ONE-A-DAY WOMENS PO) Take 1 tablet by mouth daily with breakfast.   10/26/2018 at Unknown time  . terconazole (TERAZOL 7) 0.4 % vaginal cream Place 1 applicator vaginally at bedtime. 45 g 0 10/25/2018 at Unknown time    Review of Systems  Constitutional: Negative for chills, fatigue and fever.  Gastrointestinal: Positive for abdominal pain.  Genitourinary: Negative for difficulty urinating, dysuria, flank pain, hematuria, pelvic pain, vaginal bleeding, vaginal discharge and vaginal pain.  Musculoskeletal: Negative for back pain.  All other systems reviewed and are negative.  Physical Exam   Blood pressure 127/73, pulse 75, temperature 98.7 F (37.1 C), temperature source Oral, resp. rate 18, weight 84.3 kg, last menstrual period 09/20/2018, SpO2 100 %, currently breastfeeding.  Physical Exam  Nursing note and vitals reviewed. Constitutional: She is oriented to person, place, and time. She appears well-developed and well-nourished.  Cardiovascular: Normal rate.  Respiratory: Effort normal.  GI: Soft. She exhibits no distension. There is no abdominal tenderness. There is no rebound and no guarding.  Neurological: She is alert and oriented to person, place, and  time.  Skin: Skin is warm and dry.  Psychiatric: She has a normal mood and affect. Her behavior is normal. Judgment and thought content normal.    MAU Course  Procedures  --Normal wet prep 07/13. Not re-accomplished today. Pt agreeable --Patient's pain reduced from 10/10 to 5/10 with Tylenol --No gestational sac visualized on scan from 07/13  Patient Vitals for the past 24 hrs:  BP Temp Temp src Pulse Resp SpO2 Weight  10/26/18 1859 136/77 - - 67 16 - -  10/26/18 1708 127/73 - - 75 - - -  10/26/18 1634 116/84 98.7 F (37.1 C) Oral 85 18 100 % 84.3 kg   Results for orders placed or performed during the hospital encounter of 10/26/18 (from the past 24 hour(s))  hCG, quantitative, pregnancy      Status: Abnormal   Collection Time: 10/26/18  5:36 PM  Result Value Ref Range   hCG, Beta Chain, Quant, S 3,111 (H) <5 mIU/mL   US Ob Transvaginal  Result Date: 10/26/2018 CLINICAL DATA:  Lower abdominal pain EXAM: TRANSVAGINAL OB ULTRASOUND TECHNIQUE: Transvaginal ultrasound was performed for complete evaluation of the gestation as well as the maternal uterus, adnexal regions, and pelvic cul-de-sac. COMPARISON:  10/23/2018 FINDINGS: Intrauterine gestational sac: Single Yolk sac:  Not visualized Embryo:  Not visualized Cardiac Activity: Not visualized Heart Rate:  bpm MSD: 3.97 mm   5 w   0 d CRL:     mm    w  d                  [place] EDC: Subchorionic hemorrhage:  None visualized. Maternal uterus/adnexae: No adnexal mass or free fluid. IMPRESSION: Probable early intrauterine gestational sac, but no yolk sac, fetal pole, or cardiac activity yet visualized. Recommend follow-up quantitative B-HCG levels and follow-up US in 14 days to assess viability. This recommendation follows SRU consensus guidelines: Diagnostic Criteria for Nonviable Pregnancy Early in the First Trimester. [place] Med 2013; 449:6759-16. Electronically Signed   By: Rolm Baptise M.D.   On: 10/26/2018 18:38    Assessment and Plan  --28 y.o. [id number] at [redacted]w[redacted]d by LMP --Gestational Sac visualized on [place] in MAU --Urine culture pending --Discharge home in stable condition with first trimester precautions  F/U: Repeat US as previously discussed  Darlina Rumpf, CNM 10/26/2018, 7:14 PM

## 2018-10-26 NOTE — MAU Note (Signed)
Did f/u at [facility], was told her levels went up. Her pain is still there, just won't go away.  Pain is in lower abd and towards the back as well

## 2018-10-27 LAB — CULTURE, OB URINE: Culture: 50000 — AB

## 2019-04-26 ENCOUNTER — Encounter (HOSPITAL_COMMUNITY): Payer: Self-pay

## 2019-04-26 ENCOUNTER — Other Ambulatory Visit: Payer: Self-pay

## 2019-04-26 ENCOUNTER — Emergency Department (HOSPITAL_COMMUNITY)
Admission: EM | Admit: 2019-04-26 | Discharge: 2019-04-26 | Disposition: A | Discharge status: Home / Self Care | Payer: Medicaid Other | Attending: Emergency Medicine | Admitting: Emergency Medicine

## 2019-04-26 DIAGNOSIS — Z20822 Contact with and (suspected) exposure to covid-19: Secondary | ICD-10-CM | POA: Diagnosis not present

## 2019-04-26 NOTE — ED Triage Notes (Signed)
Pt here for COVID testing, exposed to sister in law over the weekend who tested positive, pt has no complaints, no symptoms

## 2019-04-26 NOTE — ED Notes (Signed)
Patient verbalizes understanding of discharge instructions. Opportunity for questioning and answers were provided. Armband removed by staff, pt discharged from ED ambulatory w/ friend  

## 2019-04-26 NOTE — ED Provider Notes (Signed)
[place] EMERGENCY DEPARTMENT Provider Note   CSN: [id number] Arrival date & time: 04/26/19  1826     History Chief Complaint  Patient presents with  . COVID testing    [name] is a 29 y.o. female.  Pt presents to the ED today for covid testing.  She was exposed to a family member this weekend whose test came back positive today.  She is currently asymptomatic.        Past Medical History:  Diagnosis Date  . Fibroid   . GERD (gastroesophageal reflux disease)   . Gestational diabetes    with 4th preg  . Headache   . Normal labor 10/11/2014    Patient Active Problem List   Diagnosis Date Noted  . Cholestasis during pregnancy in third trimester 06/25/2017  . Vaginal delivery 06/25/2017  . Gestational diabetes 06/25/2017  . Supervision of other normal pregnancy, antepartum 01/05/2017  . History of preterm labor, current pregnancy, first trimester 11/08/2016  . H/O rapid labor 05/22/2014    Past Surgical History:  Procedure Laterality Date  . NO PAST SURGERIES       OB History    Gravida  6   Para  4   Term  3   Preterm  1   AB  1   Living  4     SAB  1   TAB      Ectopic      Multiple  0   Live Births  4           Family History  Problem Relation Age of Onset  . Diabetes Mother   . Heart disease Mother        mitral valve replaced    Social History   Tobacco Use  . Smoking status: Never Smoker  . Smokeless tobacco: Never Used  Substance Use Topics  . Alcohol use: No  . Drug use: No    Home Medications Prior to Admission medications   Medication Sig Start Date End Date Taking? Authorizing Provider  Multiple Vitamins-Calcium (ONE-A-DAY WOMENS PO) Take 1 tablet by mouth daily with breakfast.    [provider]  terconazole (TERAZOL 7) 0.4 % vaginal cream Place 1 applicator vaginally at bedtime. 10/24/18   Tresea Mall, CNM    Allergies    Patient has no known  allergies.  Review of Systems   Review of Systems  All other systems reviewed and are negative.   Physical Exam Updated Vital Signs BP 116/81   Pulse 97   Temp 99.7 F (37.6 C) (Oral)   Resp 18   LMP 09/20/2018 Comment: recent miscarriage  SpO2 99%   Breastfeeding Unknown   Physical Exam Vitals and nursing note reviewed.  Constitutional:      Appearance: Normal appearance.  HENT:     Head: Normocephalic and atraumatic.     Right Ear: External ear normal.     Left Ear: External ear normal.     Nose: Nose normal.     Mouth/Throat:     Mouth: Mucous membranes are moist.     Pharynx: Oropharynx is clear.  Eyes:     Extraocular Movements: Extraocular movements intact.     Conjunctiva/sclera: Conjunctivae normal.     Pupils: Pupils are equal, round, and reactive to light.  Cardiovascular:     Rate and Rhythm: Normal rate and regular rhythm.     Pulses: Normal pulses.     Heart sounds: Normal heart sounds.  Pulmonary:     Effort: Pulmonary effort is normal.     Breath sounds: Normal breath sounds.  Abdominal:     General: Abdomen is flat. Bowel sounds are normal.     Palpations: Abdomen is soft.  Musculoskeletal:        General: Normal range of motion.     Cervical back: Normal range of motion and neck supple.  Skin:    General: Skin is warm.     Capillary Refill: Capillary refill takes less than 2 seconds.  Neurological:     General: No focal deficit present.     Mental Status: She is alert and oriented to person, place, and time.  Psychiatric:        Mood and Affect: Mood normal.        Behavior: Behavior normal.        Thought Content: Thought content normal.        Judgment: Judgment normal.     ED Results / Procedures / Treatments   Labs (all labs ordered are listed, but only abnormal results are displayed) Labs Reviewed  SARS CORONAVIRUS 2 (TAT 6-24 HRS)    EKG None  Radiology No results found.  Procedures Procedures (including critical care  time)  Medications Ordered in ED Medications - No data to display  ED Course  I have reviewed the triage vital signs and the nursing notes.  Pertinent labs & imaging results that were available during my care of the patient were reviewed by me and considered in my medical decision making (see chart for details).    MDM Rules/Calculators/A&P                     Pt will be swabbed for Covid.  She is asymptomatic now and knows to return if her sx worsen.  [name] was evaluated in Emergency Department on 04/26/2019 for the symptoms described in the history of present illness. She was evaluated in the context of the global COVID-19 pandemic, which necessitated consideration that the patient might be at risk for infection with the SARS-CoV-2 virus that causes COVID-19. Institutional protocols and algorithms that pertain to the evaluation of patients at risk for COVID-19 are in a state of rapid change based on information released by regulatory bodies including the CDC and federal and state organizations. These policies and algorithms were followed during the patient's care in the ED. Final Clinical Impression(s) / ED Diagnoses Final diagnoses:  Exposure to COVID-19 virus  Encounter for laboratory testing for COVID-19 virus    Rx / DC Orders ED Discharge Orders    None       Isla Pence, MD 04/26/19 2007

## 2019-04-27 LAB — SARS CORONAVIRUS 2 (TAT 6-24 HRS): SARS Coronavirus 2: NEGATIVE

## 2019-06-26 ENCOUNTER — Emergency Department (HOSPITAL_COMMUNITY)
Admission: EM | Admit: 2019-06-26 | Discharge: 2019-06-26 | Disposition: A | Discharge status: Home / Self Care | Payer: Medicaid Other | Attending: Emergency Medicine | Admitting: Emergency Medicine

## 2019-06-26 ENCOUNTER — Other Ambulatory Visit: Payer: Self-pay

## 2019-06-26 ENCOUNTER — Encounter (HOSPITAL_COMMUNITY): Payer: Self-pay | Admitting: Emergency Medicine

## 2019-06-26 DIAGNOSIS — M7918 Myalgia, other site: Secondary | ICD-10-CM | POA: Insufficient documentation

## 2019-06-26 DIAGNOSIS — M79604 Pain in right leg: Secondary | ICD-10-CM | POA: Diagnosis present

## 2019-06-26 DIAGNOSIS — M79605 Pain in left leg: Secondary | ICD-10-CM | POA: Diagnosis not present

## 2019-06-26 DIAGNOSIS — M791 Myalgia, unspecified site: Secondary | ICD-10-CM

## 2019-06-26 LAB — COMPREHENSIVE METABOLIC PANEL
ALT: 31 U/L (ref 0–44)
AST: 17 U/L (ref 15–41)
Albumin: 4.3 g/dL (ref 3.5–5.0)
Alkaline Phosphatase: 65 U/L (ref 38–126)
Anion gap: 9 (ref 5–15)
BUN: 10 mg/dL (ref 6–20)
CO2: 22 mmol/L (ref 22–32)
Calcium: 9.2 mg/dL (ref 8.9–10.3)
Chloride: 107 mmol/L (ref 98–111)
Creatinine, Ser: 0.51 mg/dL (ref 0.44–1.00)
GFR calc Af Amer: >60 mL/min (ref >60)
GFR calc non Af Amer: >60 mL/min (ref >60)
Glucose, Bld: 134 mg/dL — ABNORMAL HIGH (ref 70–99)
Potassium: 4.2 mmol/L (ref 3.5–5.1)
Sodium: 138 mmol/L (ref 135–145)
Total Bilirubin: 0.9 mg/dL (ref 0.3–1.2)
Total Protein: 7.3 g/dL (ref 6.5–8.1)

## 2019-06-26 LAB — CBC WITH DIFFERENTIAL/PLATELET
Abs Immature Granulocytes: 0.03 10*3/uL (ref 0.00–0.07)
Basophils Absolute: 0 10*3/uL (ref 0.0–0.1)
Basophils Relative: 0 %
Eosinophils Absolute: 0.1 10*3/uL (ref 0.0–0.5)
Eosinophils Relative: 1 %
HCT: 43.9 % (ref 36.0–46.0)
Hemoglobin: 14.9 g/dL (ref 12.0–15.0)
Immature Granulocytes: 0 %
Lymphocytes Relative: 23 %
Lymphs Abs: 2 10*3/uL (ref 0.7–4.0)
MCH: 31.7 pg (ref 26.0–34.0)
MCHC: 33.9 g/dL (ref 30.0–36.0)
MCV: 93.4 fL (ref 80.0–100.0)
Monocytes Absolute: 0.3 10*3/uL (ref 0.1–1.0)
Monocytes Relative: 4 %
Neutro Abs: 6.1 10*3/uL (ref 1.7–7.7)
Neutrophils Relative %: 72 %
Platelets: 228 10*3/uL (ref 150–400)
RBC: 4.7 MIL/uL (ref 3.87–5.11)
RDW: 12.4 % (ref 11.5–15.5)
WBC: 8.5 10*3/uL (ref 4.0–10.5)
nRBC: 0 % (ref 0.0–0.2)

## 2019-06-26 LAB — CK: Total CK: 81 U/L (ref 38–234)

## 2019-06-26 LAB — I-STAT BETA HCG BLOOD, ED (MC, WL, AP ONLY): I-stat hCG, quantitative: <5 m[IU]/mL (ref <5)

## 2019-06-26 MED ORDER — KETOROLAC TROMETHAMINE 15 MG/ML IJ SOLN
15.0000 mg | Freq: Once | INTRAMUSCULAR | Status: AC
  Administered 2019-06-26: 15 mg via INTRAMUSCULAR
  Filled 2019-06-26: qty 1

## 2019-06-26 NOTE — ED Provider Notes (Signed)
[place] EMERGENCY DEPARTMENT Provider Note   CSN: [id number] Arrival date & time: 06/26/19  1549     History Chief Complaint  Patient presents with  . Leg Pain    [name] is a 29 y.o. female with a past medical history of GERD, fibroids presenting to the ED for myalgias to bilateral lower extremities for the past 4 to 5 days.  Reports burning and sharp pain in various parts of her leg that has been intermittent without specific aggravating or alleviating factor.  She cannot recall any incident that may have triggered this pain for her.  She has tried ibuprofen with some improvement in her symptoms.  Denies any nausea, vomiting, abdominal pain, chest pain, injuries or falls, fever, leg swelling.  HPI     Past Medical History:  Diagnosis Date  . Fibroid   . GERD (gastroesophageal reflux disease)   . Gestational diabetes    with 4th preg  . Headache   . Normal labor 10/11/2014    Patient Active Problem List   Diagnosis Date Noted  . Cholestasis during pregnancy in third trimester 06/25/2017  . Vaginal delivery 06/25/2017  . Gestational diabetes 06/25/2017  . Supervision of other normal pregnancy, antepartum 01/05/2017  . History of preterm labor, current pregnancy, first trimester 11/08/2016  . H/O rapid labor 05/22/2014    Past Surgical History:  Procedure Laterality Date  . NO PAST SURGERIES       OB History    Gravida  6   Para  4   Term  3   Preterm  1   AB  1   Living  4     SAB  1   TAB      Ectopic      Multiple  0   Live Births  4           Family History  Problem Relation Age of Onset  . Diabetes Mother   . Heart disease Mother        mitral valve replaced    Social History   Tobacco Use  . Smoking status: Never Smoker  . Smokeless tobacco: Never Used  Substance Use Topics  . Alcohol use: No  . Drug use: No    Home Medications Prior to Admission medications   Medication Sig Start  Date End Date Taking? Authorizing Provider  Multiple Vitamins-Calcium (ONE-A-DAY WOMENS PO) Take 1 tablet by mouth daily with breakfast.    [provider]  terconazole (TERAZOL 7) 0.4 % vaginal cream Place 1 applicator vaginally at bedtime. 10/24/18   Tresea Mall, CNM    Allergies    Patient has no known allergies.  Review of Systems   Review of Systems  Constitutional: Negative for appetite change, chills and fever.  HENT: Negative for ear pain, rhinorrhea, sneezing and sore throat.   Eyes: Negative for photophobia and visual disturbance.  Respiratory: Negative for cough, chest tightness, shortness of breath and wheezing.   Cardiovascular: Negative for chest pain and palpitations.  Gastrointestinal: Negative for abdominal pain, blood in stool, constipation, diarrhea, nausea and vomiting.  Genitourinary: Negative for dysuria, hematuria and urgency.  Musculoskeletal: Positive for myalgias.  Skin: Negative for rash.  Neurological: Negative for dizziness, weakness and light-headedness.    Physical Exam Updated Vital Signs BP 135/73 (BP Location: Right Arm)   Pulse 68   Temp 98.1 F (36.7 C) (Oral)   Resp 16   LMP 09/20/2018   SpO2 100%  Physical Exam Vitals and nursing note reviewed.  Constitutional:      General: She is not in acute distress.    Appearance: She is well-developed.  HENT:     Head: Normocephalic and atraumatic.     Nose: Nose normal.  Eyes:     General: No scleral icterus.       Right eye: No discharge.        Left eye: No discharge.     Conjunctiva/sclera: Conjunctivae normal.  Cardiovascular:     Rate and Rhythm: Normal rate and regular rhythm.     Heart sounds: Normal heart sounds. No murmur. No friction rub. No gallop.   Pulmonary:     Effort: Pulmonary effort is normal. No respiratory distress.     Breath sounds: Normal breath sounds.  Abdominal:     General: Bowel sounds are normal. There is no distension.     Palpations: Abdomen  is soft.     Tenderness: There is no abdominal tenderness. There is no guarding.  Musculoskeletal:        General: Normal range of motion.     Cervical back: Normal range of motion and neck supple.     Comments: No erythema, calf tenderness, edema or other abnormalities of the skin noted.  Full range of motion of bilateral ankles and knees.  Skin:    General: Skin is warm and dry.     Findings: No rash.  Neurological:     Mental Status: She is alert.     Motor: No abnormal muscle tone.     Coordination: Coordination normal.     ED Results / Procedures / Treatments   Labs (all labs ordered are listed, but only abnormal results are displayed) Labs Reviewed  COMPREHENSIVE METABOLIC PANEL  CBC WITH DIFFERENTIAL/PLATELET  CK  I-STAT BETA HCG BLOOD, ED ([facility], [facility], [facility] ONLY)    EKG None  Radiology No results found.  Procedures Procedures (including critical care time)  Medications Ordered in ED Medications - No data to display  ED Course  I have reviewed the triage vital signs and the nursing notes.  Pertinent labs & imaging results that were available during my care of the patient were reviewed by me and considered in my medical decision making (see chart for details).    MDM Rules/Calculators/A&P                      29 year old female presents to ED for myalgias to lower extremities for the past 4 days.  Reports intermittent sharp and burning pain in various parts of bilateral legs for the past 4 days.  No specific aggravating or alleviating factor.  On exam there is no external abnormalities, changes to range of motion or change in sensation of the lower extremities.  There is no calf tenderness noted.  She is not tender when I palpate throughout the legs.  She remains ambulatory.  No edema noted.  She denies any fevers, injuries or falls, back pain.  Will obtain lab work including CBC, CMP, CK to evaluate for rhabdo or other signs of dehydration as well as an hCG to evaluate  for pregnancy.  Of note, in the system states that patient is currently [redacted] weeks pregnant but she denies pregnancy at this time, stated that she was pregnant last year but had to terminate the pregnancy. Care handed off to oncoming provider pending remainder of work-up.  If her work-up is reassuring, she can be discharged home with  PCP follow-up and continued over-the-counter pain medications.  Final Clinical Impression(s) / ED Diagnoses Final diagnoses:  Myalgia    Rx / DC Orders ED Discharge Orders    None     Portions of this note were generated with Dragon dictation software. Dictation errors may occur despite best attempts at proofreading.    Delia Heady, PA-C 06/26/19 1840    Wyvonnia Dusky, MD 06/27/19 1414

## 2019-06-26 NOTE — ED Provider Notes (Signed)
  Physical Exam  BP 135/73 (BP Location: Right Arm)   Pulse 68   Temp 98.1 F (36.7 C) (Oral)   Resp 16   LMP 09/20/2018   SpO2 100%   Physical Exam  ED Course/Procedures     Procedures  MDM  Patient care assumed from Hina H PA at shift change, please see her note for full HPI.  Briefly, patient here with a history of myalgias along with bilateral lower extremity pain for the past 4 days.  No fevers, currently on Nexplanon implant but no OCPs.  No prior history of blood clots.  No trauma.  Maxie Better is for pending labs along with reassessment.  Supervision of her labs by me, CBC without any leukocytosis, hemoglobin is within normal limits without signs of anemia.  CMP without any electrolyte malady, glucose level slightly elevated at 134, does report drinking juice along with the eating "lots of bread before coming in ".  LFTs are unremarkable.  hCG is negative.  CK is normal, lower suspicion for any rhabdo.  She does have a Nexplanon implant in place on her left forearm, reports has been experiencing tingling to her bilateral lower extremities, lower suspicion for any DVT.  He does report pain along her spider veins.  Patient reassessed by me, vitals are within normal limits, exam is unremarkable.  No erythema, edema noted to bilateral legs, no signs of heart failure.  Patient received a shot of Toradol in the ED and will follow up with PCP.  Turn precautions discussed at length.    Portions of this note were generated with Lobbyist. Dictation errors may occur despite best attempts at proofreading.         Janeece Fitting, PA-C 06/26/19 2054    Noemi Chapel, MD 06/28/19 2253

## 2019-06-26 NOTE — ED Triage Notes (Signed)
Pt arrives to ED from home with complaints of her legs burning and tingling intermittly for the last five days. Patient states that different spots in her legs will burn for about 20 seconds on and off. Patient also stated she was having some blurred vision five days ago as well that has improved.

## 2019-06-26 NOTE — ED Notes (Signed)
Patient verbalizes understanding of discharge instructions. Opportunity for questioning and answers were provided. Armband removed by staff, pt discharged from ED ambulatory.   

## 2019-06-26 NOTE — Discharge Instructions (Addendum)
Your laboratory results are within normal limits today.  You need to schedule an appointment with your primary care physician in order to obtain further evaluation.  You may also want to schedule an appointment with your OB/GYN to discuss questions about Nexplanon.

## 2019-10-05 IMAGING — US TRANSVAGINAL OB ULTRASOUND
1 series · 14 of 14 positions shown · non-contrast
Comparison: 10/23/2018

CLINICAL DATA: Lower abdominal pain

EXAM:
TRANSVAGINAL OB ULTRASOUND
TECHNIQUE: Transvaginal ultrasound was performed for complete evaluation of the
gestation as well as the maternal uterus, adnexal regions, and
pelvic cul-de-sac.

[Series 1: transvaginal ob ultrasound · 14 of 14 slices shown]
[im 1/14]
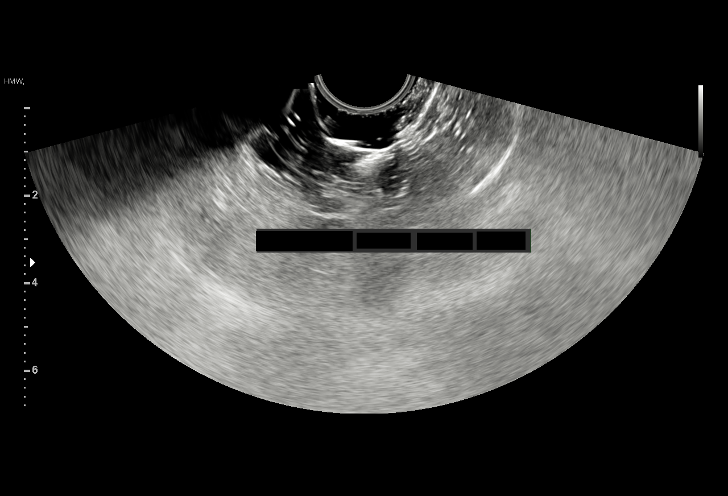
[im 2/14]
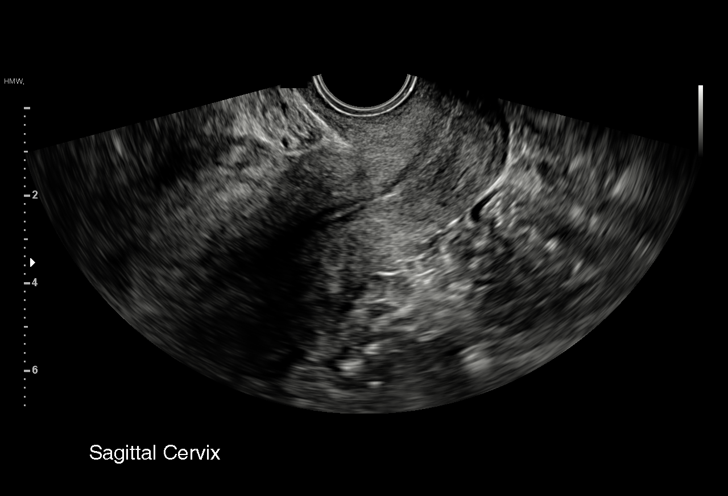
[im 3/14]
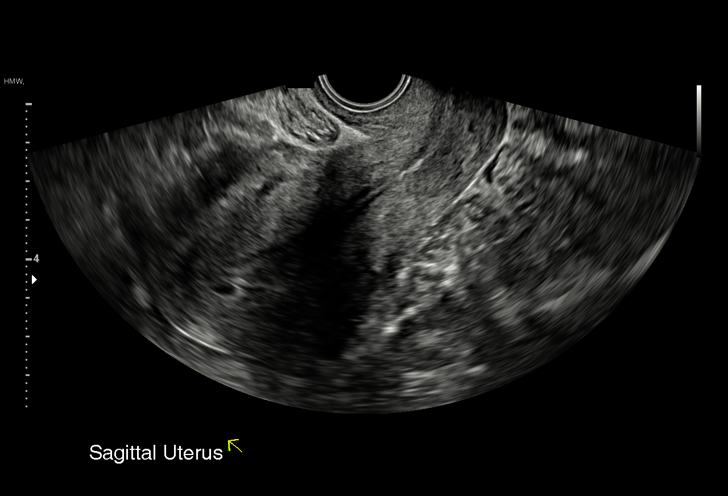
[im 4/14]
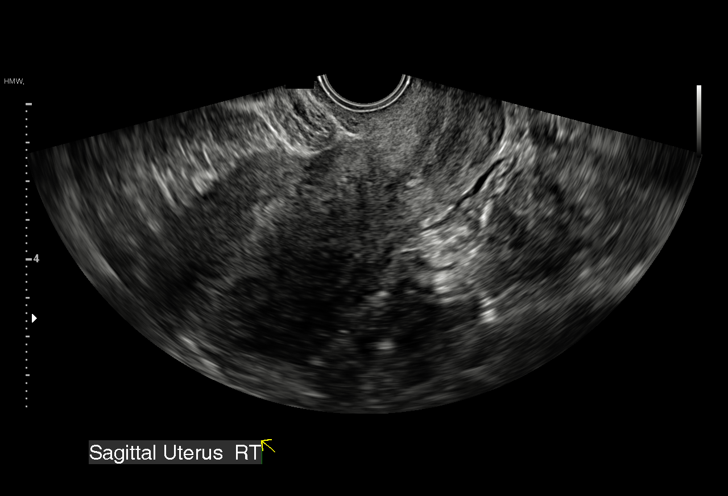
[im 5/14]
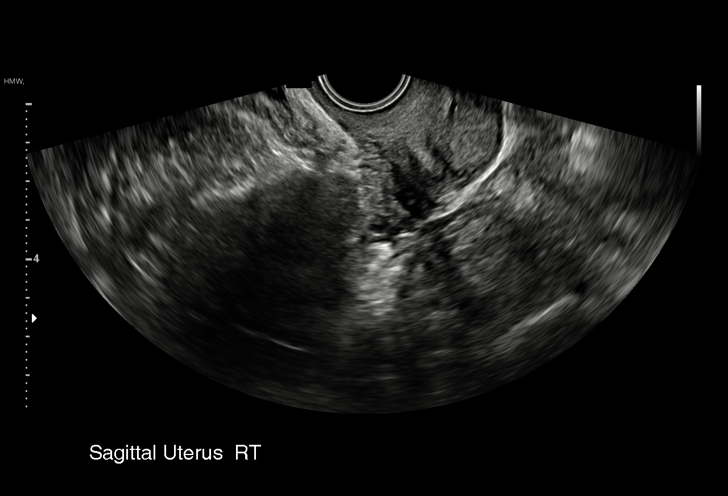
[im 6/14]
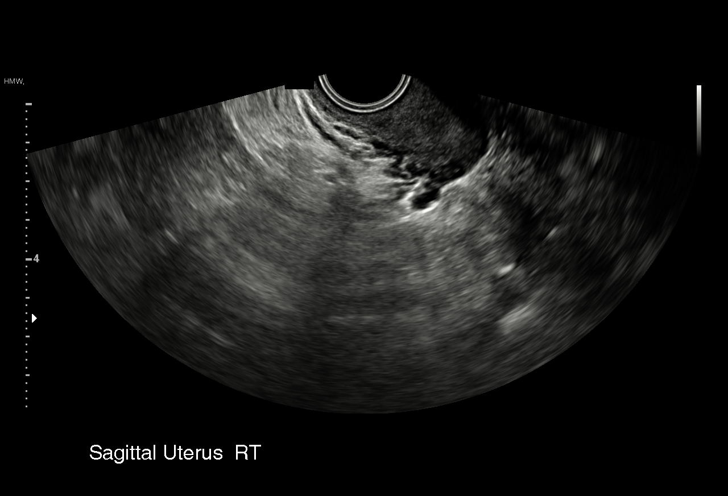
[im 7/14]
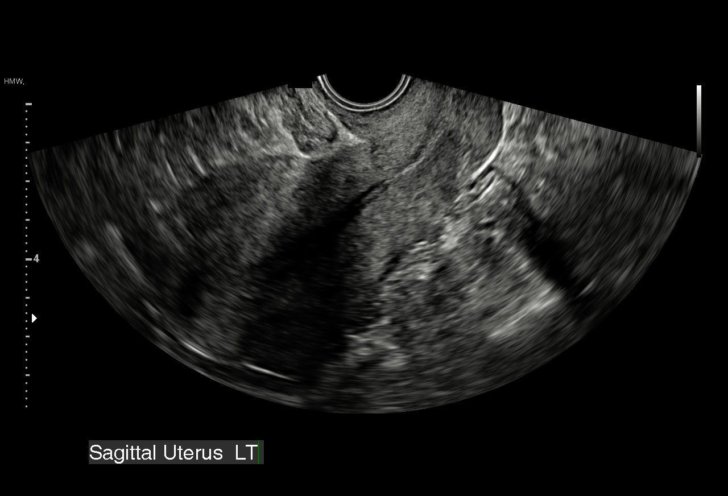
[im 8/14]
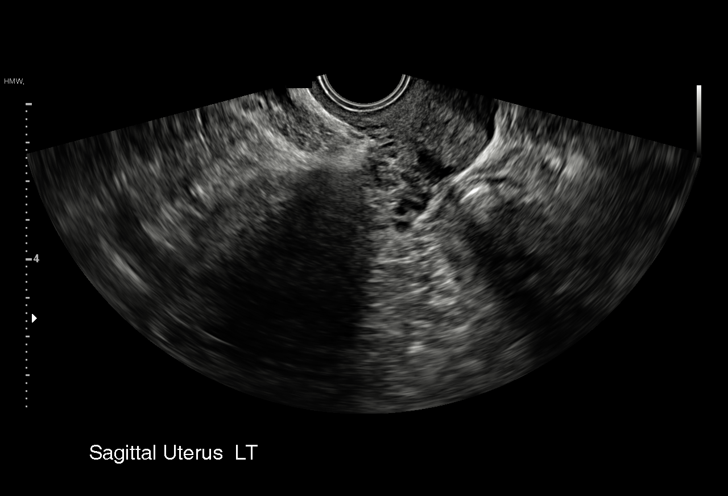
[im 9/14]
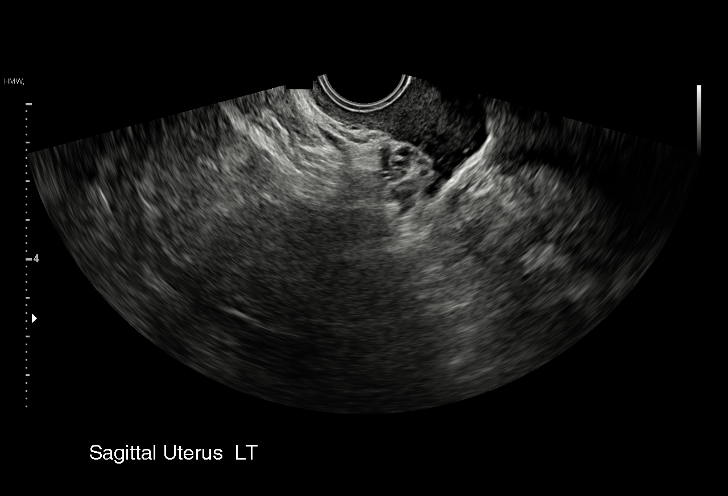
[im 10/14]
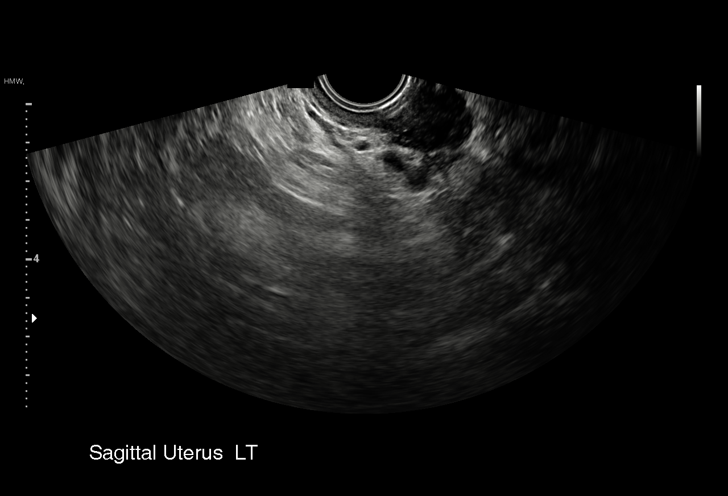
[im 11/14]
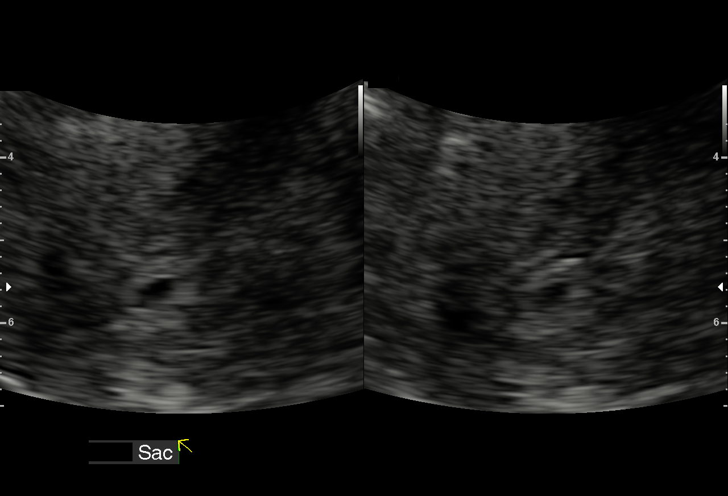
[im 12/14]
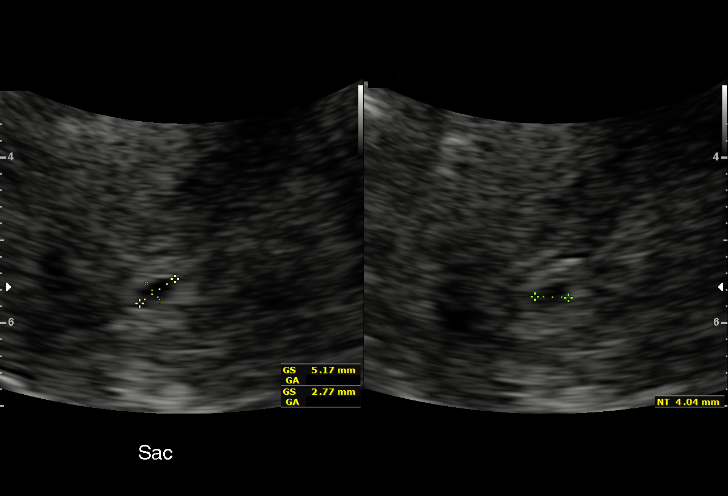
[im 13/14]
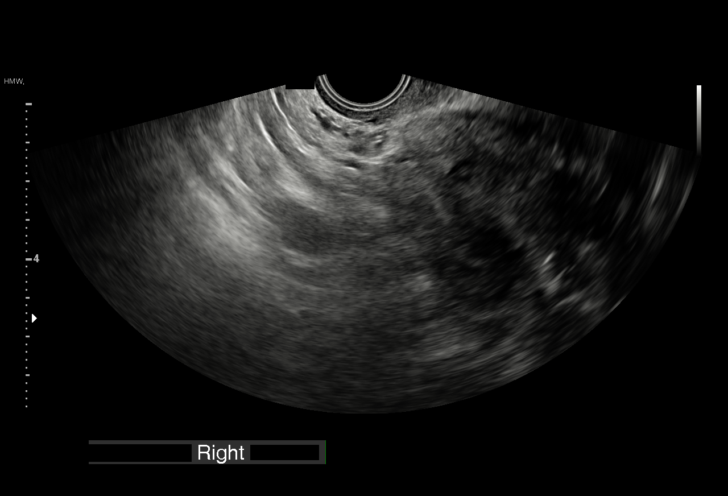
[im 14/14]
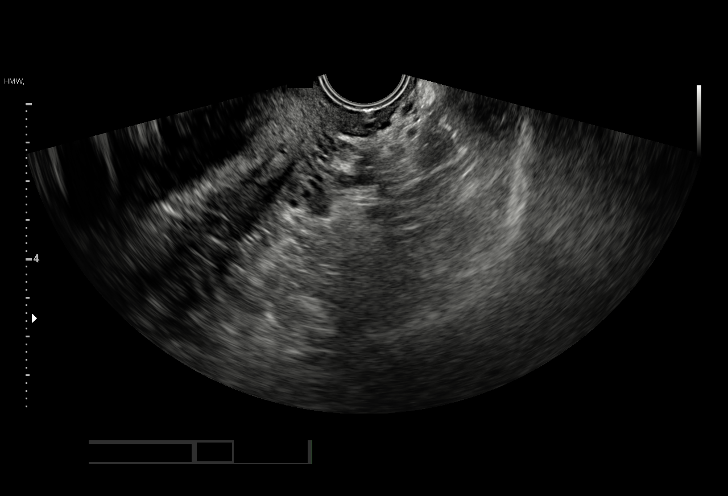

[14 of 14 positions shown; findings below may reference images not displayed]

FINDINGS: Intrauterine gestational sac: Single

Yolk sac:  Not visualized

Embryo:  Not visualized

Cardiac Activity: Not visualized

Heart Rate:  bpm

MSD: 3.97 mm   5 w   0 d

CRL:     mm    w  d                  US EDC:

Subchorionic hemorrhage:  None visualized.

Maternal uterus/adnexae: No adnexal mass or free fluid.
IMPRESSION: Probable early intrauterine gestational sac, but no yolk sac, fetal
pole, or cardiac activity yet visualized. Recommend follow-up
quantitative B-HCG levels and follow-up US in 14 days to assess
viability. This recommendation follows SRU consensus guidelines:
Diagnostic Criteria for Nonviable Pregnancy Early in the First
Trimester. N Engl J Med 7643; [DATE].

## 2024-01-17 ENCOUNTER — Emergency Department (HOSPITAL_COMMUNITY)

## 2024-01-17 ENCOUNTER — Other Ambulatory Visit: Payer: Self-pay

## 2024-01-17 ENCOUNTER — Ambulatory Visit (HOSPITAL_COMMUNITY)
Admission: EM | Admit: 2024-01-17 | Discharge: 2024-01-17 | Disposition: A | Discharge status: Home / Self Care | Attending: Family Medicine | Admitting: Family Medicine

## 2024-01-17 ENCOUNTER — Encounter (HOSPITAL_COMMUNITY): Payer: Self-pay

## 2024-01-17 ENCOUNTER — Observation Stay (HOSPITAL_COMMUNITY)
Admission: EM | Admit: 2024-01-17 | Discharge: 2024-01-19 | Disposition: A | Discharge status: Home / Self Care | Source: Ambulatory Visit | Attending: General Surgery | Admitting: General Surgery

## 2024-01-17 ENCOUNTER — Encounter (HOSPITAL_COMMUNITY): Payer: Self-pay | Admitting: Emergency Medicine

## 2024-01-17 DIAGNOSIS — K810 Acute cholecystitis: Secondary | ICD-10-CM | POA: Diagnosis present

## 2024-01-17 DIAGNOSIS — E66811 Obesity, class 1: Secondary | ICD-10-CM | POA: Diagnosis not present

## 2024-01-17 DIAGNOSIS — K219 Gastro-esophageal reflux disease without esophagitis: Secondary | ICD-10-CM | POA: Diagnosis not present

## 2024-01-17 DIAGNOSIS — Z8632 Personal history of gestational diabetes: Secondary | ICD-10-CM | POA: Diagnosis not present

## 2024-01-17 DIAGNOSIS — K8000 Calculus of gallbladder with acute cholecystitis without obstruction: Principal | ICD-10-CM

## 2024-01-17 DIAGNOSIS — K8012 Calculus of gallbladder with acute and chronic cholecystitis without obstruction: Secondary | ICD-10-CM | POA: Diagnosis not present

## 2024-01-17 DIAGNOSIS — R1011 Right upper quadrant pain: Secondary | ICD-10-CM | POA: Diagnosis present

## 2024-01-17 DIAGNOSIS — Z6833 Body mass index (BMI) 33.0-33.9, adult: Secondary | ICD-10-CM | POA: Insufficient documentation

## 2024-01-17 DIAGNOSIS — D259 Leiomyoma of uterus, unspecified: Secondary | ICD-10-CM | POA: Diagnosis not present

## 2024-01-17 LAB — HIV ANTIBODY (ROUTINE TESTING W REFLEX): HIV Screen 4th Generation wRfx: NONREACTIVE

## 2024-01-17 LAB — CBC WITH DIFFERENTIAL/PLATELET
Abs Immature Granulocytes: 0.03 K/uL (ref 0.00–0.07)
Basophils Absolute: 0 K/uL (ref 0.0–0.1)
Basophils Relative: 0 %
Eosinophils Absolute: 0.4 K/uL (ref 0.0–0.5)
Eosinophils Relative: 5 %
HCT: 40.1 % (ref 36.0–46.0)
Hemoglobin: 13.5 g/dL (ref 12.0–15.0)
Immature Granulocytes: 0 %
Lymphocytes Relative: 29 %
Lymphs Abs: 2.3 K/uL (ref 0.7–4.0)
MCH: 31.8 pg (ref 26.0–34.0)
MCHC: 33.7 g/dL (ref 30.0–36.0)
MCV: 94.6 fL (ref 80.0–100.0)
Monocytes Absolute: 0.4 K/uL (ref 0.1–1.0)
Monocytes Relative: 5 %
Neutro Abs: 4.9 K/uL (ref 1.7–7.7)
Neutrophils Relative %: 61 %
Platelets: 204 K/uL (ref 150–400)
RBC: 4.24 MIL/uL (ref 3.87–5.11)
RDW: 12.2 % (ref 11.5–15.5)
WBC: 8 K/uL (ref 4.0–10.5)
nRBC: 0 % (ref 0.0–0.2)

## 2024-01-17 LAB — CBC
HCT: 36.9 % (ref 36.0–46.0)
Hemoglobin: 12.6 g/dL (ref 12.0–15.0)
MCH: 31.7 pg (ref 26.0–34.0)
MCHC: 34.1 g/dL (ref 30.0–36.0)
MCV: 92.7 fL (ref 80.0–100.0)
Platelets: 185 K/uL (ref 150–400)
RBC: 3.98 MIL/uL (ref 3.87–5.11)
RDW: 12.2 % (ref 11.5–15.5)
WBC: 8.5 K/uL (ref 4.0–10.5)
nRBC: 0 % (ref 0.0–0.2)

## 2024-01-17 LAB — COMPREHENSIVE METABOLIC PANEL WITH GFR
ALT: 67 U/L — ABNORMAL HIGH (ref 0–44)
AST: 60 U/L — ABNORMAL HIGH (ref 15–41)
Albumin: 4.3 g/dL (ref 3.5–5.0)
Alkaline Phosphatase: 82 U/L (ref 38–126)
Anion gap: 10 (ref 5–15)
BUN: 12 mg/dL (ref 6–20)
CO2: 24 mmol/L (ref 22–32)
Calcium: 9.6 mg/dL (ref 8.9–10.3)
Chloride: 104 mmol/L (ref 98–111)
Creatinine, Ser: 0.5 mg/dL (ref 0.44–1.00)
GFR, Estimated: >60 mL/min (ref >60)
Glucose, Bld: 85 mg/dL (ref 70–99)
Potassium: 4.2 mmol/L (ref 3.5–5.1)
Sodium: 138 mmol/L (ref 135–145)
Total Bilirubin: 0.5 mg/dL (ref 0.0–1.2)
Total Protein: 7.3 g/dL (ref 6.5–8.1)

## 2024-01-17 LAB — URINALYSIS, ROUTINE W REFLEX MICROSCOPIC
Bilirubin Urine: NEGATIVE
Glucose, UA: NEGATIVE mg/dL
Hgb urine dipstick: NEGATIVE
Ketones, ur: NEGATIVE mg/dL
Nitrite: NEGATIVE
Protein, ur: NEGATIVE mg/dL
Specific Gravity, Urine: 1.014 (ref 1.005–1.030)
pH: 7 (ref 5.0–8.0)

## 2024-01-17 LAB — LIPASE, BLOOD: Lipase: 23 U/L (ref 11–51)

## 2024-01-17 LAB — HCG, SERUM, QUALITATIVE: Preg, Serum: NEGATIVE

## 2024-01-17 LAB — CREATININE, SERUM
Creatinine, Ser: 0.41 mg/dL — ABNORMAL LOW (ref 0.44–1.00)
GFR, Estimated: >60 mL/min (ref >60)

## 2024-01-17 MED ORDER — FENTANYL CITRATE PF 50 MCG/ML IJ SOSY
50.0000 ug | PREFILLED_SYRINGE | Freq: Once | INTRAMUSCULAR | Status: AC
  Administered 2024-01-17: 50 ug via INTRAVENOUS
  Filled 2024-01-17: qty 1

## 2024-01-17 MED ORDER — METOPROLOL TARTRATE 5 MG/5ML IV SOLN: 5.0000 mg | Freq: Four times a day (QID) | INTRAVENOUS | Status: DC | PRN

## 2024-01-17 MED ORDER — DOCUSATE SODIUM 100 MG PO CAPS
100.0000 mg | ORAL_CAPSULE | Freq: Two times a day (BID) | ORAL | Status: DC
  Administered 2024-01-17 – 2024-01-19 (×3): 100 mg via ORAL
  Filled 2024-01-17 (×3): qty 1

## 2024-01-17 MED ORDER — ACETAMINOPHEN 650 MG RE SUPP: 650.0000 mg | Freq: Four times a day (QID) | RECTAL | Status: DC | PRN

## 2024-01-17 MED ORDER — POTASSIUM CHLORIDE IN NACL 20-0.9 MEQ/L-% IV SOLN
INTRAVENOUS | Status: AC
Start: 2024-01-17 — End: 2024-01-18
  Filled 2024-01-17 (×2): qty 1000

## 2024-01-17 MED ORDER — SODIUM CHLORIDE 0.9 % IV SOLN
2.0000 g | Freq: Once | INTRAVENOUS | Status: AC
  Administered 2024-01-17: 2 g via INTRAVENOUS
  Filled 2024-01-17: qty 20

## 2024-01-17 MED ORDER — ONDANSETRON 4 MG PO TBDP: 4.0000 mg | ORAL_TABLET | Freq: Four times a day (QID) | ORAL | Status: DC | PRN

## 2024-01-17 MED ORDER — DIPHENHYDRAMINE HCL 50 MG/ML IJ SOLN: 25.0000 mg | Freq: Four times a day (QID) | INTRAMUSCULAR | Status: DC | PRN

## 2024-01-17 MED ORDER — ONDANSETRON HCL 4 MG/2ML IJ SOLN
4.0000 mg | Freq: Four times a day (QID) | INTRAMUSCULAR | Status: DC | PRN
  Administered 2024-01-18 – 2024-01-19 (×2): 4 mg via INTRAVENOUS
  Filled 2024-01-17 (×2): qty 2

## 2024-01-17 MED ORDER — ENOXAPARIN SODIUM 40 MG/0.4ML IJ SOSY
40.0000 mg | PREFILLED_SYRINGE | Freq: Every day | INTRAMUSCULAR | Status: DC
  Administered 2024-01-17 – 2024-01-19 (×2): 40 mg via SUBCUTANEOUS
  Filled 2024-01-17 (×2): qty 0.4

## 2024-01-17 MED ORDER — ONDANSETRON HCL 4 MG/2ML IJ SOLN
4.0000 mg | Freq: Once | INTRAMUSCULAR | Status: AC
  Administered 2024-01-17: 4 mg via INTRAVENOUS
  Filled 2024-01-17: qty 2

## 2024-01-17 MED ORDER — SIMETHICONE 80 MG PO CHEW
40.0000 mg | CHEWABLE_TABLET | Freq: Four times a day (QID) | ORAL | Status: DC | PRN
  Administered 2024-01-18: 40 mg via ORAL
  Filled 2024-01-17: qty 1

## 2024-01-17 MED ORDER — INFLUENZA VIRUS VACC SPLIT PF (FLUZONE) 0.5 ML IM SUSY
0.5000 mL | PREFILLED_SYRINGE | INTRAMUSCULAR | Status: DC
Start: 2024-01-18 — End: 2024-01-19

## 2024-01-17 MED ORDER — DIPHENHYDRAMINE HCL 25 MG PO CAPS: 25.0000 mg | ORAL_CAPSULE | Freq: Four times a day (QID) | ORAL | Status: DC | PRN

## 2024-01-17 MED ORDER — HYDROCODONE-ACETAMINOPHEN 5-325 MG PO TABS
1.0000 | ORAL_TABLET | ORAL | Status: DC | PRN
  Administered 2024-01-18: 2 via ORAL
  Filled 2024-01-17: qty 2

## 2024-01-17 MED ORDER — ACETAMINOPHEN 325 MG PO TABS: 650.0000 mg | ORAL_TABLET | Freq: Four times a day (QID) | ORAL | Status: DC | PRN

## 2024-01-17 MED ORDER — HYDROMORPHONE HCL 1 MG/ML IJ SOLN
0.5000 mg | INTRAMUSCULAR | Status: DC | PRN
  Administered 2024-01-17 – 2024-01-19 (×8): 0.5 mg via INTRAVENOUS
  Filled 2024-01-17: qty 0.5
  Filled 2024-01-17: qty 1
  Filled 2024-01-17 (×6): qty 0.5

## 2024-01-17 MED ORDER — HYDROMORPHONE HCL 1 MG/ML IJ SOLN
0.5000 mg | Freq: Once | INTRAMUSCULAR | Status: AC
  Administered 2024-01-17: 0.5 mg via INTRAVENOUS
  Filled 2024-01-17: qty 1

## 2024-01-17 MED ORDER — SODIUM CHLORIDE 0.9 % IV SOLN
2.0000 g | Freq: Every day | INTRAVENOUS | Status: DC
  Administered 2024-01-18: 2 g via INTRAVENOUS
  Filled 2024-01-17 (×2): qty 20

## 2024-01-17 NOTE — ED Provider Notes (Signed)
 [facility]    CSN: [id number] Arrival date & time: 01/17/24  1010      History   Chief Complaint No chief complaint on file.   HPI [name] is a 33 y.o. female.   HPI Here for pain in her RUQ abdomen  2 weeks ago she began having some sharp pain in her right anterior upper chest.  It would worsen with movement and with deep breath.  She is uncertain if she had a fever.  No cough and no rash  Then about 2 days ago she started having some pain in her right lower chest and then in her right upper abdomen.  It radiates around to her right flank and is worsened by eating anything.  At times it is ranked 10 out of 10.  She has had some nausea intermittently but no vomiting.  She does have a history of gallstones noted incidentally on ultrasound.  She does have a history of some liver enzyme elevations also.  Last menstrual cycle September 9, and she does have the Nexplanon. Past Medical History:  Diagnosis Date   Fibroid    GERD (gastroesophageal reflux disease)    Gestational diabetes    with 4th preg   Headache    Normal labor 10/11/2014    Patient Active Problem List   Diagnosis Date Noted   Cholestasis during pregnancy in third trimester 06/25/2017   Vaginal delivery 06/25/2017   Gestational diabetes 06/25/2017   Supervision of other normal pregnancy, antepartum 01/05/2017   History of preterm labor, current pregnancy, first trimester 11/08/2016   H/O rapid labor 05/22/2014    Past Surgical History:  Procedure Laterality Date   NO PAST SURGERIES      OB History     Gravida  6   Para  4   Term  3   Preterm  1   AB  1   Living  4      SAB  1   IAB      Ectopic      Multiple  0   Live Births  4            Home Medications    Prior to Admission medications   Medication Sig Start Date End Date Taking? Authorizing Provider  ibuprofen  (ADVIL ) 200 MG tablet Take 200 mg by mouth every 6 (six) hours as needed for  moderate pain.     [provider]  Multiple Vitamins-Calcium (ONE-A-DAY WOMENS PO) Take 1 tablet by mouth daily with breakfast.    [provider]  phentermine (ADIPEX-P) 37.5 MG tablet Take 37.5 mg by mouth every morning.    [provider]  terconazole  (TERAZOL 7 ) 0.4 % vaginal cream Place 1 applicator vaginally at bedtime. 10/24/18   Edmundo Powell BIRCH, CNM    Family History Family History  Problem Relation Age of Onset   Diabetes Mother    Heart disease Mother        mitral valve replaced    Social History Social History   Tobacco Use   Smoking status: Never   Smokeless tobacco: Never  Vaping Use   Vaping status: Never Used  Substance Use Topics   Alcohol use: No   Drug use: No     Allergies   Oxycodone    Review of Systems Review of Systems   Physical Exam Triage Vital Signs ED Triage Vitals  Encounter Vitals Group     BP 01/17/24 1117 116/80  Girls Systolic BP Percentile --      Girls Diastolic BP Percentile --      Boys Systolic BP Percentile --      Boys Diastolic BP Percentile --      Pulse Rate 01/17/24 1117 64     Resp 01/17/24 1117 18     Temp 01/17/24 1117 99 F (37.2 C)     Temp Source 01/17/24 1117 Oral     SpO2 01/17/24 1117 97 %     Weight --      Height --      Head Circumference --      Peak Flow --      Pain Score 01/17/24 1112 5     Pain Loc --      Pain Education --      Exclude from Growth Chart --    No data found.  Updated Vital Signs BP 116/80 (BP Location: Right Arm)   Pulse 64   Temp 99 F (37.2 C) (Oral)   Resp 18   LMP 12/20/2023   SpO2 97%   Visual Acuity Right Eye Distance:   Left Eye Distance:   Bilateral Distance:    Right Eye Near:   Left Eye Near:    Bilateral Near:     Physical Exam Vitals reviewed.  Constitutional:      General: She is not in acute distress.    Appearance: She is not toxic-appearing.  HENT:     Nose: Nose normal.     Mouth/Throat:     Mouth: Mucous  membranes are moist.     Pharynx: No oropharyngeal exudate or posterior oropharyngeal erythema.  Eyes:     Extraocular Movements: Extraocular movements intact.     Conjunctiva/sclera: Conjunctivae normal.     Pupils: Pupils are equal, round, and reactive to light.  Cardiovascular:     Rate and Rhythm: Normal rate and regular rhythm.     Heart sounds: No murmur heard. Pulmonary:     Effort: Pulmonary effort is normal. No respiratory distress.     Breath sounds: No stridor. No wheezing, rhonchi or rales.  Chest:     Chest wall: Tenderness (There is tenderness of the right upper anterior chest at about rib 2.) present.  Abdominal:     Palpations: Abdomen is soft.     Tenderness: There is abdominal tenderness (There is tenderness in the right upper quadrant.).  Musculoskeletal:     Cervical back: Neck supple.  Lymphadenopathy:     Cervical: No cervical adenopathy.  Skin:    Capillary Refill: Capillary refill takes less than 2 seconds.     Coloration: Skin is not jaundiced or pale.  Neurological:     General: No focal deficit present.     Mental Status: She is alert and oriented to person, place, and time.  Psychiatric:        Behavior: Behavior normal.      UC Treatments / Results  Labs (all labs ordered are listed, but only abnormal results are displayed) Labs Reviewed - No data to display  EKG   Radiology No results found.  Procedures Procedures (including critical care time)  Medications Ordered in UC Medications - No data to display  Initial Impression / Assessment and Plan / UC Course  I have reviewed the triage vital signs and the nursing notes.  Pertinent labs & imaging results that were available during my care of the patient were reviewed by me and considered in my medical decision  making (see chart for details).     I have asked her and her husband to proceed to the emergency room for further evaluation and treatment.  She is agreeable and will go by  private vehicle. Final Clinical Impressions(s) / UC Diagnoses   Final diagnoses:  RUQ abdominal pain     Discharge Instructions      Please go to the emergency room for further evaluation and treatment    ED Prescriptions   None    PDMP not reviewed this encounter.   Vonna Sharlet POUR, MD 01/17/24 1153

## 2024-01-17 NOTE — ED Triage Notes (Signed)
 Symptoms started right chest through to back and started 2 weeks ago.  Now pain is in right right epigastric area and is intermittent and hurts through to back.  Chest pain is present only to palpation.  Patient has been taking ibuprofen .  Intermittent nausea, noticed when eating, cannot eat very much at all and pain worsens with eating.  Denies diarrhea.  Last BM was yesterday.  Reports this was a normal BM and she is frequently constipated.  .    Patient has been told in the past she has had elevated liver enzymes Has been told she has 2 gallstones.

## 2024-01-17 NOTE — H&P (Signed)
 H&P Note  [name] 02-Apr-1991  [id number].    Requesting MD: [place]in River, MD Chief Complaint/Reason for Consult: Acute cholecystitis   HPI:  Patient is a 33 year old female with known cholelithiasis who presented to the ED today with RUQ abdominal pain. Pain has been significant over the last 2 days and radiates around right flank. Exacerbated by eating. Intermittent and at times severe. Associated nausea but no emesis. No prior abdominal surgery. She has PMH of uterine fibroids, GERD, gestational diabetes and Obesity class I. She is not on blood thinners. Reports nausea with oxycodone .  She has had some nausea associated with the pain.  Imaging showed a large stone impacted at the neck of the gallbladder with gallbladder wall thickening.  2 of her 4 liver functions were just slightly elevated  ROS: Negative other than HPI  Family History  Problem Relation Age of Onset   Diabetes Mother    Heart disease Mother        mitral valve replaced    Past Medical History:  Diagnosis Date   Fibroid    GERD (gastroesophageal reflux disease)    Gestational diabetes    with 4th preg   Headache    Normal labor 10/11/2014    Past Surgical History:  Procedure Laterality Date   NO PAST SURGERIES      Social History:  reports that she has never smoked. She has never used smokeless tobacco. She reports that she does not drink alcohol and does not use drugs.  Allergies:  Allergies  Allergen Reactions   Oxycodone  Nausea Only and Other (See Comments)    (Not in a hospital admission)   Blood pressure (!) 134/93, pulse 75, temperature 98.6 F (37 C), temperature source Oral, resp. rate 16, last menstrual period 12/20/2023, SpO2 96%, unknown if currently breastfeeding. Physical Exam:  General: pleasant, WD, obese female who is laying in bed in NAD HEENT: head is normocephalic, atraumatic.  Sclera are anicteric.  Ears and nose without any masses or lesions.   Mouth is pink and moist Heart: regular, rate, and rhythm.  Normal s1,s2. No obvious murmurs, gallops, or rubs noted.  Palpable radial and pedal pulses bilaterally Lungs: CTAB, no wheezes, rhonchi, or rales noted.  Respiratory effort nonlabored Abd: There is moderate right upper quadrant tenderness soft, TTP in RUQ, ND, no masses, hernias, or organomegaly MS: all 4 extremities are symmetrical with no cyanosis, clubbing, or edema. Skin: warm and dry with no masses, lesions, or rashes Neuro: Cranial nerves 2-12 grossly intact, sensation is normal throughout Psych: A&Ox3 with an appropriate affect.   Results for orders placed or performed during the hospital encounter of 01/17/24 (from the past 48 hours)  Comprehensive metabolic panel     Status: Abnormal   Collection Time: 01/17/24 12:08 PM  Result Value Ref Range   Sodium 138 135 - 145 mmol/L   Potassium 4.2 3.5 - 5.1 mmol/L   Chloride 104 98 - 111 mmol/L   CO2 24 22 - 32 mmol/L   Glucose, Bld 85 70 - 99 mg/dL    Comment: Glucose reference range applies only to samples taken after fasting for at least 8 hours.   BUN 12 6 - 20 mg/dL   Creatinine, Ser 9.49 0.44 - 1.00 mg/dL   Calcium 9.6 8.9 - 89.6 mg/dL   Total Protein 7.3 6.5 - 8.1 g/dL   Albumin 4.3 3.5 - 5.0 g/dL   AST 60 (H) 15 - 41 U/L  ALT 67 (H) 0 - 44 U/L   Alkaline Phosphatase 82 38 - 126 U/L   Total Bilirubin 0.5 0.0 - 1.2 mg/dL   GFR, Estimated >39 >39 mL/min    Comment: (NOTE) Calculated using the CKD-EPI Creatinine Equation (2021)    Anion gap 10 5 - 15    Comment: Performed at [facility], [address], [place], [place] [address]  Lipase, blood     Status: None   Collection Time: 01/17/24 12:08 PM  Result Value Ref Range   Lipase 23 11 - 51 U/L    Comment: Performed at [facility], [address], [place], [place] [address]  CBC with Diff     Status: None   Collection Time: 01/17/24 12:08 PM  Result Value Ref Range   WBC  8.0 4.0 - 10.5 K/uL   RBC 4.24 3.87 - 5.11 MIL/uL   Hemoglobin 13.5 12.0 - 15.0 g/dL   HCT 59.8 63.9 - 53.9 %   MCV 94.6 80.0 - 100.0 fL   MCH 31.8 26.0 - 34.0 pg   MCHC 33.7 30.0 - 36.0 g/dL   RDW 87.7 88.4 - 84.4 %   Platelets 204 150 - 400 K/uL   nRBC 0.0 0.0 - 0.2 %   Neutrophils Relative % 61 %   Neutro Abs 4.9 1.7 - 7.7 K/uL   Lymphocytes Relative 29 %   Lymphs Abs 2.3 0.7 - 4.0 K/uL   Monocytes Relative 5 %   Monocytes Absolute 0.4 0.1 - 1.0 K/uL   Eosinophils Relative 5 %   Eosinophils Absolute 0.4 0.0 - 0.5 K/uL   Basophils Relative 0 %   Basophils Absolute 0.0 0.0 - 0.1 K/uL   Immature Granulocytes 0 %   Abs Immature Granulocytes 0.03 0.00 - 0.07 K/uL    Comment: Performed at [facility], [address] [place], [place] [address]  Urinalysis, Routine w reflex microscopic -Urine, Clean Catch     Status: Abnormal   Collection Time: 01/17/24 12:08 PM  Result Value Ref Range   Color, Urine YELLOW YELLOW   APPearance CLEAR CLEAR   Specific Gravity, Urine 1.014 1.005 - 1.030   pH 7.0 5.0 - 8.0   Glucose, UA NEGATIVE NEGATIVE mg/dL   Hgb urine dipstick NEGATIVE NEGATIVE   Bilirubin Urine NEGATIVE NEGATIVE   Ketones, ur NEGATIVE NEGATIVE mg/dL   Protein, ur NEGATIVE NEGATIVE mg/dL   Nitrite NEGATIVE NEGATIVE   Leukocytes,Ua TRACE (A) NEGATIVE   RBC / HPF 0-5 0 - 5 RBC/hpf   WBC, UA 0-5 0 - 5 WBC/hpf   Bacteria, UA RARE (A) NONE SEEN   Squamous Epithelial / HPF 0-5 0 - 5 /HPF   Mucus PRESENT     Comment: Performed at [facility], [address], [place], [place] [address]  hCG, serum, qualitative     Status: None   Collection Time: 01/17/24 12:08 PM  Result Value Ref Range   Preg, Serum NEGATIVE NEGATIVE    Comment:        THE SENSITIVITY OF THIS METHODOLOGY IS >10 mIU/mL. Performed at [facility], [address] [place], [place] [address]    US  Abdomen Limited RUQ (LIVER/GB) Result Date:  01/17/2024 CLINICAL DATA:  Right upper quadrant abdominal pain EXAM: ULTRASOUND ABDOMEN LIMITED RIGHT UPPER QUADRANT COMPARISON:  CT abdomen and pelvis 11/19/2008 FINDINGS: Gallbladder: Cholelithiasis with large stone in the gallbladder measuring 2.4 cm. Moderate gallbladder wall thickening measuring 4.8 mm.  No pericholecystic edema. Murphy's sign is negative. Common bile duct: Diameter: 4 mm, normal Liver: No focal lesion identified. Within normal limits in parenchymal echogenicity. Portal vein is patent on color Doppler imaging with normal direction of blood flow towards the liver. Other: None. IMPRESSION: 1. Cholelithiasis with moderate gallbladder wall thickening. This could indicate acute cholecystitis in the appropriate clinical setting although Murphy's sign is negative. 2. No bile duct dilatation.  Normal appearance of the liver. Electronically Signed   By: Elsie Gravely M.D.   On: 01/17/2024 16:05      Assessment/Plan Acute cholelithiasis  - RUQ US  with large gallstone and gallbladder wall thickening  - no leukocytosis, afebrile, HD stable  - patient with persistent pain on exam  - I have explained the procedure, risks, and aftercare of Laparoscopic cholecystectomy with possible IOC.  Risks include but are not limited to anesthesia (MI, CVA, death, prolonged intubation and aspiration), bleeding, infection, wound problems, hernia, bile leak, injury to common bile duct/liver/intestine, possible need for subtotal cholecystectomy or open cholecystectomy, increased risk of DVT/PE and diarrhea post op. She seems to understand and agrees to proceed.   Admit to observation and plan laparoscopic cholecystectomy tomorrow AM. Patient may be able to go home post-operatively pending OR findings and post-op course.   FEN: ok to have diet as tolerated tonight, NPO after MN VTE: LMWH ID: [email or web address]   GERD Uterine fibroids Hx of gestational diabetes Obesity class I  I reviewed ED provider notes, last  24 h vitals and pain scores, last 48 h intake and output, last 24 h labs and trends, and last 24 h imaging results.  This care required high  level of medical decision making.    [facility] 01/17/2024, 4:30 PM Please see Amion for pager number during day hours 7:00am-4:30pm

## 2024-01-17 NOTE — ED Provider Notes (Signed)
 [place] EMERGENCY DEPARTMENT AT [facility] Provider Note   CSN: [id number] Arrival date & time: 01/17/24  1204     Patient presents with: Abdominal Pain   [name] is a 33 y.o. female.    Abdominal Pain Patient with right sided upper abdominal pain.  Has had for around 2 days.  History of biliary colic.  No cholecystectomy due to pregnancy.  Now with hand pain.  Worse with eating.  Some nausea.  Ate crackers while in the waiting room    Past Medical History:  Diagnosis Date   Fibroid    GERD (gastroesophageal reflux disease)    Gestational diabetes    with 4th preg   Headache    Normal labor 10/11/2014    Prior to Admission medications   Medication Sig Start Date End Date Taking? Authorizing Provider  Cholecalciferol (VITAMIN D3 ULTRA POTENCY) 1.25 MG (50000 UT) TABS Take 50,000 Units by mouth every Monday.   Yes [provider]  etonogestrel (NEXPLANON) 68 MG IMPL implant 1 each by Subdermal route once. 08/04/20  Yes [provider]  ibuprofen  (ADVIL ) 200 MG tablet Take 200-800 mg by mouth every 6 (six) hours as needed (for pain).   Yes [provider]  Multiple Vitamins-Calcium (ONE-A-DAY WOMENS PO) Take 1 tablet by mouth daily with breakfast.   Yes [provider]  phentermine (ADIPEX-P) 37.5 MG tablet Take 37.5 mg by mouth every morning.   Yes [provider]  terconazole  (TERAZOL 7 ) 0.4 % vaginal cream Place 1 applicator vaginally at bedtime. Patient not taking: Reported on 01/17/2024 10/24/18   Edmundo Moats D, CNM    Allergies: Oxycodone     Review of Systems  Gastrointestinal:  Positive for abdominal pain.    Updated Vital Signs BP 109/67 (BP Location: Left Arm)   Pulse (!) 57   Temp 97.6 F (36.4 C)   Resp 16   Ht 5' (1.524 m)   Wt 78.2 kg   LMP 12/20/2023   SpO2 100%   BMI 33.67 kg/m   Physical Exam Vitals and nursing note reviewed.  Cardiovascular:     Rate and Rhythm:  Normal rate.  Pulmonary:     Breath sounds: Normal breath sounds.  Abdominal:     Tenderness: There is abdominal tenderness.     Hernia: No hernia is present.     Comments: Right upper quadrant tenderness doubt renal or guarding.  No hernia palpated  Neurological:     Mental Status: She is alert.     (all labs ordered are listed, but only abnormal results are displayed) Labs Reviewed  COMPREHENSIVE METABOLIC PANEL WITH GFR - Abnormal; Notable for the following components:      Result Value   AST 60 (*)    ALT 67 (*)    All other components within normal limits  URINALYSIS, ROUTINE W REFLEX MICROSCOPIC - Abnormal; Notable for the following components:   Leukocytes,Ua TRACE (*)    Bacteria, UA RARE (*)    All other components within normal limits  CREATININE, SERUM - Abnormal; Notable for the following components:   Creatinine, Ser 0.41 (*)    All other components within normal limits  MRSA NEXT GEN BY PCR, NASAL  LIPASE, BLOOD  CBC WITH DIFFERENTIAL/PLATELET  HCG, SERUM, QUALITATIVE  HIV ANTIBODY (ROUTINE TESTING W REFLEX)  CBC  COMPREHENSIVE METABOLIC PANEL WITH GFR  CBC    EKG: None  Radiology: US  Abdomen Limited RUQ (LIVER/GB) Result Date: 01/17/2024 CLINICAL DATA:  Right upper quadrant abdominal pain EXAM: ULTRASOUND ABDOMEN LIMITED RIGHT UPPER QUADRANT COMPARISON:  CT abdomen and pelvis 11/19/2008 FINDINGS: Gallbladder: Cholelithiasis with large stone in the gallbladder measuring 2.4 cm. Moderate gallbladder wall thickening measuring 4.8 mm. No pericholecystic edema. Murphy's sign is negative. Common bile duct: Diameter: 4 mm, normal Liver: No focal lesion identified. Within normal limits in parenchymal echogenicity. Portal vein is patent on color Doppler imaging with normal direction of blood flow towards the liver. Other: None. IMPRESSION: 1. Cholelithiasis with moderate gallbladder wall thickening. This could indicate acute cholecystitis in the appropriate clinical  setting although Murphy's sign is negative. 2. No bile duct dilatation.  Normal appearance of the liver. Electronically Signed   By: Elsie Gravely M.D.   On: 01/17/2024 16:05     Procedures   Medications Ordered in the ED  enoxaparin (LOVENOX) injection 40 mg (40 mg Subcutaneous Given 01/17/24 1938)  cefTRIAXone  (ROCEPHIN ) 2 g in sodium chloride  0.9 % 100 mL IVPB (has no administration in time range)  0.9 % NaCl with KCl 20 mEq/ L  infusion ( Intravenous New Bag/Given 01/17/24 2026)  acetaminophen  (TYLENOL ) tablet 650 mg (has no administration in time range)    Or  acetaminophen  (TYLENOL ) suppository 650 mg (has no administration in time range)  HYDROcodone-acetaminophen  (NORCO/VICODIN) 5-325 MG per tablet 1-2 tablet (has no administration in time range)  HYDROmorphone (DILAUDID) injection 0.5 mg (0.5 mg Intravenous Given 01/17/24 1952)  diphenhydrAMINE  (BENADRYL ) capsule 25 mg (has no administration in time range)    Or  diphenhydrAMINE  (BENADRYL ) injection 25 mg (has no administration in time range)  docusate sodium  (COLACE) capsule 100 mg (100 mg Oral Given 01/17/24 2027)  ondansetron  (ZOFRAN -ODT) disintegrating tablet 4 mg (has no administration in time range)    Or  ondansetron  (ZOFRAN ) injection 4 mg (has no administration in time range)  simethicone  (MYLICON) chewable tablet 40 mg (has no administration in time range)  metoprolol tartrate (LOPRESSOR) injection 5 mg (has no administration in time range)  influenza vac split trivalent PF (FLUZONE) injection 0.5 mL (has no administration in time range)  fentaNYL  (SUBLIMAZE ) injection 50 mcg (50 mcg Intravenous Given 01/17/24 1556)  HYDROmorphone (DILAUDID) injection 0.5 mg (0.5 mg Intravenous Given 01/17/24 1636)  ondansetron  (ZOFRAN ) injection 4 mg (4 mg Intravenous Given 01/17/24 1636)  cefTRIAXone  (ROCEPHIN ) 2 g in sodium chloride  0.9 % 100 mL IVPB (0 g Intravenous Stopped 01/17/24 1739)                                    Medical  Decision Making Amount and/or Complexity of Data Reviewed Labs: ordered. Radiology: ordered.  Risk Prescription drug management. Decision regarding hospitalization.   Patient with right upper quad abdominal pain.  History of cholelithiasis.  I think this is likely the cause.  LFTs are mildly elevated.  Will get ultrasound to evaluate.  Ultrasound shows potential cholecystitis.  No sonographic Murphy's benign however.  Seen by general surgery and admitted.     Final diagnoses:  Calculus of gallbladder with acute cholecystitis without obstruction    ED Discharge Orders     None          Patsey Lot, MD 01/17/24 2313

## 2024-01-17 NOTE — ED Triage Notes (Addendum)
 Pt reports with RUQ pain ad nausea x 2 days. Pt reports being dx with gallstones 1 yr ago.

## 2024-01-17 NOTE — ED Notes (Signed)
 Patient is being discharged from the Urgent Care and sent to the Emergency Department via POV/Husband with patient . Per Dr Vonna, patient is in need of higher level of care due to severity of abdominal pain and limited resources. Patient is aware and verbalizes understanding of plan of care.  Vitals:   01/17/24 1117  BP: 116/80  Pulse: 64  Resp: 18  Temp: 99 F (37.2 C)  SpO2: 97%

## 2024-01-17 NOTE — Discharge Instructions (Signed)
 Please go to the emergency room for further evaluation and treatment.

## 2024-01-18 ENCOUNTER — Encounter (HOSPITAL_COMMUNITY)
Admission: EM | Disposition: A | Discharge status: Home / Self Care | Payer: Self-pay | Source: Ambulatory Visit | Attending: Emergency Medicine

## 2024-01-18 ENCOUNTER — Observation Stay (HOSPITAL_COMMUNITY): Admitting: Anesthesiology

## 2024-01-18 ENCOUNTER — Other Ambulatory Visit: Payer: Self-pay

## 2024-01-18 ENCOUNTER — Encounter (HOSPITAL_COMMUNITY): Payer: Self-pay

## 2024-01-18 DIAGNOSIS — K8010 Calculus of gallbladder with chronic cholecystitis without obstruction: Secondary | ICD-10-CM | POA: Diagnosis not present

## 2024-01-18 HISTORY — PX: CHOLECYSTECTOMY: SHX55

## 2024-01-18 LAB — COMPREHENSIVE METABOLIC PANEL WITH GFR
ALT: 71 U/L — ABNORMAL HIGH (ref 0–44)
AST: 37 U/L (ref 15–41)
Albumin: 4.2 g/dL (ref 3.5–5.0)
Alkaline Phosphatase: 88 U/L (ref 38–126)
Anion gap: 12 (ref 5–15)
BUN: 10 mg/dL (ref 6–20)
CO2: 20 mmol/L — ABNORMAL LOW (ref 22–32)
Calcium: 9.4 mg/dL (ref 8.9–10.3)
Chloride: 104 mmol/L (ref 98–111)
Creatinine, Ser: 0.48 mg/dL (ref 0.44–1.00)
GFR, Estimated: >60 mL/min (ref >60)
Glucose, Bld: 131 mg/dL — ABNORMAL HIGH (ref 70–99)
Potassium: 3.8 mmol/L (ref 3.5–5.1)
Sodium: 136 mmol/L (ref 135–145)
Total Bilirubin: 0.5 mg/dL (ref 0.0–1.2)
Total Protein: 7.2 g/dL (ref 6.5–8.1)

## 2024-01-18 LAB — CBC
HCT: 41.2 % (ref 36.0–46.0)
Hemoglobin: 13.7 g/dL (ref 12.0–15.0)
MCH: 31.2 pg (ref 26.0–34.0)
MCHC: 33.3 g/dL (ref 30.0–36.0)
MCV: 93.8 fL (ref 80.0–100.0)
Platelets: 216 K/uL (ref 150–400)
RBC: 4.39 MIL/uL (ref 3.87–5.11)
RDW: 12.1 % (ref 11.5–15.5)
WBC: 11 K/uL — ABNORMAL HIGH (ref 4.0–10.5)
nRBC: 0 % (ref 0.0–0.2)

## 2024-01-18 LAB — MRSA NEXT GEN BY PCR, NASAL: MRSA by PCR Next Gen: NOT DETECTED

## 2024-01-18 SURGERY — LAPAROSCOPIC CHOLECYSTECTOMY
Anesthesia: General | Site: Abdomen

## 2024-01-18 MED ORDER — DEXAMETHASONE SODIUM PHOSPHATE 10 MG/ML IJ SOLN
INTRAMUSCULAR | Status: AC
  Filled 2024-01-18: qty 1

## 2024-01-18 MED ORDER — ONDANSETRON HCL 4 MG/2ML IJ SOLN
INTRAMUSCULAR | Status: AC
  Filled 2024-01-18: qty 2

## 2024-01-18 MED ORDER — MIDAZOLAM HCL 2 MG/2ML IJ SOLN
INTRAMUSCULAR | Status: DC | PRN
  Administered 2024-01-18: 2 mg via INTRAVENOUS

## 2024-01-18 MED ORDER — AMISULPRIDE (ANTIEMETIC) 5 MG/2ML IV SOLN: 10.0000 mg | Freq: Once | INTRAVENOUS | Status: DC | PRN

## 2024-01-18 MED ORDER — MIDAZOLAM HCL 2 MG/2ML IJ SOLN
INTRAMUSCULAR | Status: AC
  Filled 2024-01-18: qty 2

## 2024-01-18 MED ORDER — PROPOFOL 500 MG/50ML IV EMUL
INTRAVENOUS | Status: AC
  Filled 2024-01-18: qty 100

## 2024-01-18 MED ORDER — LIDOCAINE HCL (PF) 2 % IJ SOLN
INTRAMUSCULAR | Status: DC | PRN
  Administered 2024-01-18: 100 mg via INTRADERMAL

## 2024-01-18 MED ORDER — STERILE WATER FOR IRRIGATION IR SOLN
Status: DC | PRN
  Administered 2024-01-18: 1000 mL

## 2024-01-18 MED ORDER — FENTANYL CITRATE PF 50 MCG/ML IJ SOSY
25.0000 ug | PREFILLED_SYRINGE | INTRAMUSCULAR | Status: DC | PRN
  Administered 2024-01-18 (×3): 50 ug via INTRAVENOUS

## 2024-01-18 MED ORDER — DEXAMETHASONE SODIUM PHOSPHATE 10 MG/ML IJ SOLN
INTRAMUSCULAR | Status: DC | PRN
  Administered 2024-01-18: 10 mg via INTRAVENOUS

## 2024-01-18 MED ORDER — CHLORHEXIDINE GLUCONATE 0.12 % MT SOLN
15.0000 mL | Freq: Once | OROMUCOSAL | Status: AC
  Administered 2024-01-18: 15 mL via OROMUCOSAL

## 2024-01-18 MED ORDER — SPY AGENT GREEN - (INDOCYANINE FOR INJECTION)
INTRAMUSCULAR | Status: DC | PRN
  Administered 2024-01-18: 2.5 mg via INTRAVENOUS

## 2024-01-18 MED ORDER — LIDOCAINE HCL (PF) 2 % IJ SOLN
INTRAMUSCULAR | Status: AC
  Filled 2024-01-18: qty 5

## 2024-01-18 MED ORDER — LACTATED RINGERS IR SOLN
Status: DC | PRN
Start: 2024-01-18 — End: 2024-01-18
  Administered 2024-01-18: 1000 mL

## 2024-01-18 MED ORDER — PROPOFOL 500 MG/50ML IV EMUL
INTRAVENOUS | Status: DC | PRN
  Administered 2024-01-18: 50 ug/kg/min via INTRAVENOUS

## 2024-01-18 MED ORDER — BUPIVACAINE-EPINEPHRINE (PF) 0.25% -1:200000 IJ SOLN
INTRAMUSCULAR | Status: AC
  Filled 2024-01-18: qty 30

## 2024-01-18 MED ORDER — SODIUM CHLORIDE 0.9 % IV SOLN: INTRAVENOUS | Status: AC

## 2024-01-18 MED ORDER — PROPOFOL 10 MG/ML IV BOLUS
INTRAVENOUS | Status: AC
  Filled 2024-01-18: qty 20

## 2024-01-18 MED ORDER — ROCURONIUM BROMIDE 10 MG/ML (PF) SYRINGE
PREFILLED_SYRINGE | INTRAVENOUS | Status: DC | PRN
  Administered 2024-01-18: 50 mg via INTRAVENOUS

## 2024-01-18 MED ORDER — PROPOFOL 10 MG/ML IV BOLUS
INTRAVENOUS | Status: DC | PRN
  Administered 2024-01-18: 30 mg via INTRAVENOUS
  Administered 2024-01-18: 150 mg via INTRAVENOUS
  Administered 2024-01-18: 20 mg via INTRAVENOUS

## 2024-01-18 MED ORDER — BUPIVACAINE-EPINEPHRINE 0.25% -1:200000 IJ SOLN
INTRAMUSCULAR | Status: DC | PRN
  Administered 2024-01-18: 26 mL

## 2024-01-18 MED ORDER — SUGAMMADEX SODIUM 200 MG/2ML IV SOLN
INTRAVENOUS | Status: AC
  Filled 2024-01-18: qty 2

## 2024-01-18 MED ORDER — ONDANSETRON HCL 4 MG/2ML IJ SOLN
INTRAMUSCULAR | Status: DC | PRN
  Administered 2024-01-18: 4 mg via INTRAVENOUS

## 2024-01-18 MED ORDER — LACTATED RINGERS IV SOLN: INTRAVENOUS | Status: DC | PRN

## 2024-01-18 MED ORDER — ACETAMINOPHEN 10 MG/ML IV SOLN: 1000.0000 mg | Freq: Once | INTRAVENOUS | Status: DC | PRN

## 2024-01-18 MED ORDER — FENTANYL CITRATE (PF) 250 MCG/5ML IJ SOLN
INTRAMUSCULAR | Status: DC | PRN
  Administered 2024-01-18: 50 ug via INTRAVENOUS
  Administered 2024-01-18: 100 ug via INTRAVENOUS
  Administered 2024-01-18 (×2): 50 ug via INTRAVENOUS

## 2024-01-18 MED ORDER — SUGAMMADEX SODIUM 200 MG/2ML IV SOLN
INTRAVENOUS | Status: DC | PRN
  Administered 2024-01-18: 200 mg via INTRAVENOUS

## 2024-01-18 MED ORDER — FENTANYL CITRATE (PF) 250 MCG/5ML IJ SOLN
INTRAMUSCULAR | Status: AC
  Filled 2024-01-18: qty 5

## 2024-01-18 MED ORDER — PROPOFOL 500 MG/50ML IV EMUL
INTRAVENOUS | Status: AC
  Filled 2024-01-18: qty 50

## 2024-01-18 MED ORDER — ROCURONIUM BROMIDE 10 MG/ML (PF) SYRINGE
PREFILLED_SYRINGE | INTRAVENOUS | Status: AC
  Filled 2024-01-18: qty 10

## 2024-01-18 MED ORDER — FENTANYL CITRATE PF 50 MCG/ML IJ SOSY
PREFILLED_SYRINGE | INTRAMUSCULAR | Status: AC
  Filled 2024-01-18: qty 1

## 2024-01-18 MED ORDER — 0.9 % SODIUM CHLORIDE (POUR BTL) OPTIME
TOPICAL | Status: DC | PRN
Start: 2024-01-18 — End: 2024-01-18
  Administered 2024-01-18: 1000 mL

## 2024-01-18 SURGICAL SUPPLY — 29 items
BAG COUNTER SPONGE SURGICOUNT (BAG) IMPLANT
CABLE HIGH FREQUENCY MONO STRZ (ELECTRODE) ×2 IMPLANT
CATH REDDICK CHOLANGI 4FR 50CM (CATHETERS) ×2 IMPLANT
CHLORAPREP W/TINT 26 (MISCELLANEOUS) ×2 IMPLANT
CLIP APPLIE 5 13 M/L LIGAMAX5 (MISCELLANEOUS) ×2 IMPLANT
COVER MAYO STAND XLG (MISCELLANEOUS) ×2 IMPLANT
COVER SURGICAL LIGHT HANDLE (MISCELLANEOUS) ×2 IMPLANT
DERMABOND ADVANCED .7 DNX12 (GAUZE/BANDAGES/DRESSINGS) ×2 IMPLANT
DRAPE C-ARM 42X120 X-RAY (DRAPES) ×2 IMPLANT
ELECT REM PT RETURN 15FT ADLT (MISCELLANEOUS) ×2 IMPLANT
GLOVE BIO SURGEON STRL SZ7.5 (GLOVE) ×2 IMPLANT
GOWN STRL REUS W/ TWL LRG LVL3 (GOWN DISPOSABLE) IMPLANT
HEMOSTAT SNOW SURGICEL 2X4 (HEMOSTASIS) IMPLANT
IRRIGATION SUCT STRKRFLW 2 WTP (MISCELLANEOUS) ×2 IMPLANT
IV CATH 14GX2 1/4 (CATHETERS) ×2 IMPLANT
KIT BASIN OR (CUSTOM PROCEDURE TRAY) ×2 IMPLANT
KIT TURNOVER KIT A (KITS) ×2 IMPLANT
PENCIL SMOKE EVACUATOR (MISCELLANEOUS) IMPLANT
SCISSORS LAP 5X35 DISP (ENDOMECHANICALS) ×2 IMPLANT
SET TUBE SMOKE EVAC HIGH FLOW (TUBING) ×2 IMPLANT
SLEEVE Z-THREAD 5X100MM (TROCAR) ×4 IMPLANT
SPIKE FLUID TRANSFER (MISCELLANEOUS) ×2 IMPLANT
SUT MNCRL AB 4-0 PS2 18 (SUTURE) ×2 IMPLANT
SUT VICRYL 0 UR6 27IN ABS (SUTURE) ×4 IMPLANT
SYSTEM BAG RETRIEVAL 10MM (BASKET) ×2 IMPLANT
TOWEL OR 17X26 10 PK STRL BLUE (TOWEL DISPOSABLE) ×2 IMPLANT
TRAY LAPAROSCOPIC (CUSTOM PROCEDURE TRAY) ×2 IMPLANT
TROCAR BALLN 12MMX100 BLUNT (TROCAR) ×2 IMPLANT
TROCAR Z-THREAD OPTICAL 5X100M (TROCAR) ×2 IMPLANT

## 2024-01-18 NOTE — Discharge Instructions (Signed)

## 2024-01-18 NOTE — Anesthesia Postprocedure Evaluation (Signed)
 Anesthesia Post Note  Patient: [name]  Procedure(s) Performed: LAPAROSCOPIC CHOLECYSTECTOMY (Abdomen)     Patient location during evaluation: PACU Anesthesia Type: General Level of consciousness: awake Pain management: pain level controlled Vital Signs Assessment: post-procedure vital signs reviewed and stable Respiratory status: spontaneous breathing, nonlabored ventilation and respiratory function stable Cardiovascular status: blood pressure returned to baseline and stable Postop Assessment: no apparent nausea or vomiting Anesthetic complications: no   No notable events documented.  Last Vitals:  Vitals:   01/18/24 1445 01/18/24 1500  BP: 133/84 138/88  Pulse: 64 63  Resp: 13 15  Temp:    SpO2: 95% 100%    Last Pain:  Vitals:   01/18/24 1509  TempSrc:   PainSc: 5                  Delon Aisha Arch

## 2024-01-18 NOTE — Anesthesia Procedure Notes (Signed)
 Procedure Name: Intubation Date/Time: 01/18/2024 1:10 PM  Performed by: Augusta Daved SAILOR, CRNAPre-anesthesia Checklist: Patient identified, Emergency Drugs available, Suction available and Patient being monitored Patient Re-evaluated:Patient Re-evaluated prior to induction Oxygen Delivery Method: Circle System Utilized Preoxygenation: Pre-oxygenation with 100% oxygen Induction Type: IV induction Ventilation: Mask ventilation without difficulty Laryngoscope Size: Glidescope and 3 Grade View: Grade I Tube type: Oral Number of attempts: 1 Airway Equipment and Method: Stylet and Oral airway Placement Confirmation: ETT inserted through vocal cords under direct vision, positive ETCO2 and breath sounds checked- equal and bilateral Secured at: 21 (at the lip) cm Tube secured with: Tape Dental Injury: Teeth and Oropharynx as per pre-operative assessment  Comments: DL with Cleotilde 2 attempted, unable to view VC. Pt. BMV and glidescope used successfully.

## 2024-01-18 NOTE — Transfer of Care (Signed)
 Immediate Anesthesia Transfer of Care Note  Patient: [name]  Procedure(s) Performed: LAPAROSCOPIC CHOLECYSTECTOMY (Abdomen)  Patient Location: PACU  Anesthesia Type:General  Level of Consciousness: drowsy and patient cooperative  Airway & Oxygen Therapy: Patient Spontanous Breathing and Patient connected to face mask oxygen  Post-op Assessment: Report given to RN and Post -op Vital signs reviewed and stable  Post vital signs: Reviewed and stable  Last Vitals:  Vitals Value Taken Time  BP 136/70 01/18/24 14:33  Temp    Pulse 80 01/18/24 14:36  Resp 16 01/18/24 14:36  SpO2 97 % 01/18/24 14:36  Vitals shown include unfiled device data.  Last Pain:  Vitals:   01/18/24 1117  TempSrc:   PainSc: 7       Patients Stated Pain Goal: 5 (01/18/24 1117)  Complications: No notable events documented.

## 2024-01-18 NOTE — Op Note (Signed)
 01/17/2024 - 01/18/2024  2:22 PM  PATIENT:  [name]  33 y.o. female  PRE-OPERATIVE DIAGNOSIS:  Cholecystitis with cholelithiasis  POST-OPERATIVE DIAGNOSIS:  Cholecystitis with cholelithiasis  PROCEDURE:  Procedure(s) with comments: LAPAROSCOPIC CHOLECYSTECTOMY WITH ICG DYE  SURGEON:  Surgeons and Role:    * Curvin Deward MOULD, MD - Primary  PHYSICIAN ASSISTANT:   ASSISTANTS: none   ANESTHESIA:   local and general  EBL:  25 mL   BLOOD ADMINISTERED:none  DRAINS: none   LOCAL MEDICATIONS USED:  MARCAINE      SPECIMEN:  Source of Specimen:  gallbladder  DISPOSITION OF SPECIMEN:  PATHOLOGY  COUNTS:  YES  TOURNIQUET:  * No tourniquets in log *  DICTATION: .Dragon Dictation    Procedure: After informed consent was obtained the patient was brought to the operating room and placed in the supine position on the operating room table. After adequate induction of general anesthesia the patient's abdomen was prepped with ChloraPrep allowed to dry and draped in usual sterile manner. An appropriate timeout was performed. The area below the umbilicus was infiltrated with quarter percent  Marcaine . A small incision was made with a 15 blade knife. The incision was carried down through the subcutaneous tissue bluntly with a hemostat and [employer] retractors. The linea alba was identified. The linea alba was incised with a 15 blade knife and each side was grasped with Coker clamps. The preperitoneal space was then probed with a hemostat until the peritoneum was opened and access was gained to the abdominal cavity. A 0 Vicryl pursestring stitch was placed in the fascia surrounding the opening. A Hassan cannula was then placed through the opening and anchored in place with the previously placed Vicryl purse string stitch. The abdomen was insufflated with carbon dioxide without difficulty. A laparoscope was inserted through the [facility] cannula in the right upper quadrant was inspected.  Next the epigastric region was infiltrated with % Marcaine . A small incision was made with a 15 blade knife. A 5 mm port was placed bluntly through this incision into the abdominal cavity under direct vision. Next 2 sites were chosen laterally on the right side of the abdomen for placement of 5 mm ports. Each of these areas was infiltrated with quarter percent Marcaine . Small stab incisions were made with a 15 blade knife. 5 mm ports were then placed bluntly through these incisions into the abdominal cavity under direct vision without difficulty. A blunt grasper was placed through the lateralmost 5 mm port and used to grasp the dome of the gallbladder and elevate it anteriorly and superiorly.  There were significant fatty adhesions to the body of the gallbladder that were taken down by some blunt and sharp dissection with the laparoscopic dissector.  Another blunt grasper was placed through the other 5 mm port and used to retract the body and neck of the gallbladder. A dissector was placed through the epigastric port and using the electrocautery the peritoneal reflection at the gallbladder neck was opened. Blunt dissection was then carried out in this area until the gallbladder neck-cystic duct junction was readily identified and a good critical window was created.  The ICG camera was used and we could see the common bile duct easily.  Our dissection was well away from this area.  Her cystic duct was very small. 2 clips were placed proximally on the cystic duct and 1 distally and the duct was divided between the 2 sets of clips. Posterior to this the cystic artery was identified and  again dissected bluntly in a circumferential manner until a good window  was created. 2 clips were placed proximally and one distally on the artery and the artery was divided between the 2 sets of clips. Next a laparoscopic hook cautery device was used to separate the gallbladder from the liver bed. Prior to completely detaching the  gallbladder from the liver bed the liver bed was inspected and several small bleeding points were coagulated with the electrocautery until the area was completely hemostatic. The gallbladder was then detached the rest of it from the liver bed without difficulty. A laparoscopic bag was inserted through the hassan port. The laparoscope was moved to the epigastric port. The gallbladder was placed within the bag and the bag was sealed.  The bag with the gallbladder was then removed with the [facility] cannula through the infraumbilical port without difficulty. The fascial defect was then closed with the previously placed Vicryl pursestring stitch as well as with another figure-of-eight 0 Vicryl stitch. The liver bed was inspected again and found to be hemostatic. The abdomen was irrigated with copious amounts of saline until the effluent was clear. The ports were then removed under direct vision without difficulty and were found to be hemostatic. The gas was allowed to escape. No other abnormalities were noted on general inspection of the abdomen. The skin incisions were all closed with interrupted 4-0 Monocryl subcuticular stitches. Dermabond dressings were applied. The patient tolerated the procedure well. At the end of the case all needle sponge and instrument counts were correct. The patient was then awakened and taken to recovery in stable condition  PLAN OF CARE: Admit for overnight observation  PATIENT DISPOSITION:  PACU - hemodynamically stable.   Delay start of Pharmacological VTE agent (>24hrs) due to surgical blood loss or risk of bleeding: no

## 2024-01-18 NOTE — Anesthesia Preprocedure Evaluation (Addendum)
 Anesthesia Evaluation  Patient identified by MRN, date of birth, ID band Patient awake    Reviewed: Allergy & Precautions, NPO status , Patient's Chart, lab work & pertinent test results  History of Anesthesia Complications Negative for: history of anesthetic complications  Airway Mallampati: II  TM Distance: >3 FB Neck ROM: Full    Dental  (+) Dental Advisory Given   Pulmonary neg pulmonary ROS   Pulmonary exam normal breath sounds clear to auscultation       Cardiovascular negative cardio ROS  Rhythm:Regular Rate:Normal     Neuro/Psych  Headaches, neg Seizures    GI/Hepatic Neg liver ROS,GERD  ,,Cholecystitis    Endo/Other  diabetes    Renal/GU negative Renal ROS     Musculoskeletal   Abdominal  (+) + obese  Peds  Hematology negative hematology ROS (+) Lab Results      Component                Value               Date                      WBC                      11.0 (H)            01/18/2024                HGB                      13.7                01/18/2024                HCT                      41.2                01/18/2024                MCV                      93.8                01/18/2024                PLT                      216                 01/18/2024              Anesthesia Other Findings Last phentermine: 01/15/2024  Reproductive/Obstetrics Fibroid                               Anesthesia Physical Anesthesia Plan  ASA: 2  Anesthesia Plan: General   Post-op Pain Management:    Induction: Intravenous  PONV Risk Score and Plan: 3 and Ondansetron , Dexamethasone  and Treatment may vary due to age or medical condition  Airway Management Planned: Oral ETT  Additional Equipment:   Intra-op Plan:   Post-operative Plan: Extubation in OR  Informed Consent: I have reviewed the patients History and Physical, chart, labs and discussed the procedure including  the risks, benefits and alternatives for the proposed anesthesia with the  patient or authorized representative who has indicated his/her understanding and acceptance.     Dental advisory given  Plan Discussed with: CRNA and Anesthesiologist  Anesthesia Plan Comments: (Risks of general anesthesia discussed including, but not limited to, sore throat, hoarse voice, chipped/damaged teeth, injury to vocal cords, nausea and vomiting, allergic reactions, lung infection, heart attack, stroke, and death. All questions answered. )         Anesthesia Quick Evaluation

## 2024-01-18 NOTE — Progress Notes (Signed)
   01/18/24 1040  TOC Brief Assessment  Insurance and Status Reviewed  Patient has primary care physician Yes  Home environment has been reviewed home w/ family  Prior level of function: independent  Prior/Current Home Services No current home services  Social Drivers of Health Review SDOH reviewed no interventions necessary  Readmission risk has been reviewed Yes  Transition of care needs no transition of care needs at this time

## 2024-01-18 NOTE — Progress Notes (Signed)
 Patient states, the pain in the same as before, it goes to the back on right side. Patient was able to ambulate to the bathroom and back to bed with standby assistance. Patient family at bedside. Plan of care ongoing.

## 2024-01-18 NOTE — Interval H&P Note (Signed)
 History and Physical Interval Note:  01/18/2024 12:34 PM  [name]  has presented today for surgery, with the diagnosis of Cholecystitis.  The various methods of treatment have been discussed with the patient and family. After consideration of risks, benefits and other options for treatment, the patient has consented to  Procedure(s) with comments: LAPAROSCOPIC CHOLECYSTECTOMY WITH INTRAOPERATIVE CHOLANGIOGRAM (N/A) - Possible cholangiogram as a surgical intervention.  The patient's history has been reviewed, patient examined, no change in status, stable for surgery.  I have reviewed the patient's chart and labs.  Questions were answered to the patient's satisfaction.     Deward Null III

## 2024-01-18 NOTE — Plan of Care (Signed)
   Problem: Clinical Measurements: Goal: Will remain free from infection Outcome: Progressing Goal: Diagnostic test results will improve Outcome: Progressing

## 2024-01-19 ENCOUNTER — Encounter (HOSPITAL_COMMUNITY): Payer: Self-pay | Admitting: General Surgery

## 2024-01-19 LAB — CBC
HCT: 37.2 % (ref 36.0–46.0)
Hemoglobin: 12.4 g/dL (ref 12.0–15.0)
MCH: 31.9 pg (ref 26.0–34.0)
MCHC: 33.3 g/dL (ref 30.0–36.0)
MCV: 95.6 fL (ref 80.0–100.0)
Platelets: 203 K/uL (ref 150–400)
RBC: 3.89 MIL/uL (ref 3.87–5.11)
RDW: 12.6 % (ref 11.5–15.5)
WBC: 13.1 K/uL — ABNORMAL HIGH (ref 4.0–10.5)
nRBC: 0 % (ref 0.0–0.2)

## 2024-01-19 LAB — COMPREHENSIVE METABOLIC PANEL WITH GFR
ALT: 126 U/L — ABNORMAL HIGH (ref 0–44)
AST: 53 U/L — ABNORMAL HIGH (ref 15–41)
Albumin: 3.9 g/dL (ref 3.5–5.0)
Alkaline Phosphatase: 87 U/L (ref 38–126)
Anion gap: 8 (ref 5–15)
BUN: 6 mg/dL (ref 6–20)
CO2: 24 mmol/L (ref 22–32)
Calcium: 9.3 mg/dL (ref 8.9–10.3)
Chloride: 104 mmol/L (ref 98–111)
Creatinine, Ser: 0.56 mg/dL (ref 0.44–1.00)
GFR, Estimated: >60 mL/min (ref >60)
Glucose, Bld: 100 mg/dL — ABNORMAL HIGH (ref 70–99)
Potassium: 4 mmol/L (ref 3.5–5.1)
Sodium: 136 mmol/L (ref 135–145)
Total Bilirubin: 0.3 mg/dL (ref 0.0–1.2)
Total Protein: 6.5 g/dL (ref 6.5–8.1)

## 2024-01-19 LAB — SURGICAL PATHOLOGY

## 2024-01-19 MED ORDER — TRAMADOL HCL 50 MG PO TABS: 50.0000 mg | ORAL_TABLET | Freq: Four times a day (QID) | ORAL | Status: DC | PRN

## 2024-01-19 MED ORDER — IBUPROFEN 800 MG PO TABS: 800.0000 mg | ORAL_TABLET | Freq: Three times a day (TID) | ORAL | 0 refills | Status: AC | PRN

## 2024-01-19 MED ORDER — HYDROMORPHONE HCL 1 MG/ML IJ SOLN: 0.5000 mg | INTRAMUSCULAR | Status: DC | PRN

## 2024-01-19 MED ORDER — HYDROCODONE-ACETAMINOPHEN 5-325 MG PO TABS
1.0000 | ORAL_TABLET | ORAL | Status: DC | PRN
  Administered 2024-01-19: 2 via ORAL
  Filled 2024-01-19: qty 2

## 2024-01-19 MED ORDER — ONDANSETRON 4 MG PO TBDP: 4.0000 mg | ORAL_TABLET | Freq: Four times a day (QID) | ORAL | 0 refills | Status: AC | PRN

## 2024-01-19 MED ORDER — ACETAMINOPHEN 500 MG PO TABS
1000.0000 mg | ORAL_TABLET | Freq: Four times a day (QID) | ORAL | Status: DC
  Administered 2024-01-19: 1000 mg via ORAL
  Filled 2024-01-19: qty 2

## 2024-01-19 MED ORDER — HYDROCODONE-ACETAMINOPHEN 5-325 MG PO TABS: 1.0000 | ORAL_TABLET | Freq: Four times a day (QID) | ORAL | 0 refills | Status: AC | PRN

## 2024-01-19 MED ORDER — ACETAMINOPHEN 325 MG PO TABS: 650.0000 mg | ORAL_TABLET | Freq: Four times a day (QID) | ORAL | Status: AC | PRN

## 2024-01-19 MED ORDER — HYDROCODONE-ACETAMINOPHEN 5-325 MG PO TABS: 1.0000 | ORAL_TABLET | ORAL | Status: DC | PRN

## 2024-01-19 MED ORDER — ACETAMINOPHEN 325 MG PO TABS: 650.0000 mg | ORAL_TABLET | Freq: Three times a day (TID) | ORAL | Status: DC

## 2024-01-19 MED ORDER — METHOCARBAMOL 500 MG PO TABS: 500.0000 mg | ORAL_TABLET | Freq: Three times a day (TID) | ORAL | 0 refills | Status: AC | PRN

## 2024-01-19 MED ORDER — METHOCARBAMOL 500 MG PO TABS
500.0000 mg | ORAL_TABLET | Freq: Three times a day (TID) | ORAL | Status: DC
  Administered 2024-01-19 (×2): 500 mg via ORAL
  Filled 2024-01-19 (×2): qty 1

## 2024-01-19 MED ORDER — IBUPROFEN 400 MG PO TABS
800.0000 mg | ORAL_TABLET | Freq: Three times a day (TID) | ORAL | Status: DC
  Administered 2024-01-19 (×2): 800 mg via ORAL
  Filled 2024-01-19 (×2): qty 2

## 2024-01-19 NOTE — Progress Notes (Signed)
 Progress Note  1 Day Post-Op  Subjective: Pt reports poor pain control overnight, did not tolerated norco well secondary to nausea. Keeping food down and passing small amount of flatus. Mostly drinking water. Encouraged ambulation this AM.   Objective: Vital signs in last 24 hours: Temp:  [97.5 F (36.4 C)-98.8 F (37.1 C)] 97.8 F (36.6 C) (10/09 0846) Pulse Rate:  [55-96] 72 (10/09 0846) Resp:  [13-20] 16 (10/09 0846) BP: (114-146)/(69-89) 115/69 (10/09 0846) SpO2:  [95 %-100 %] 100 % (10/09 0846) Weight:  [78.2 kg] 78.2 kg (10/08 1117) Last BM Date : 01/17/24  Intake/Output from previous day: 10/08 0701 - 10/09 0700 In: 2010 [P.O.:910; I.V.:1000; IV Piggyback:100] Out: 2225 [Urine:2200; Blood:25] Intake/Output this shift: No intake/output data recorded.  PE: General: pleasant, WD, overweight female who is laying in bed in NAD HEENT: sclera anicteric Heart: regular, rate, and rhythm.   Lungs: Respiratory effort nonlabored Abd: soft, appropriately ttp, incisions C/D/I, mild distention  Psych: A&Ox3 with an appropriate affect.    Lab Results:  Recent Labs    01/17/24 1710 01/18/24 0513  WBC 8.5 11.0*  HGB 12.6 13.7  HCT 36.9 41.2  PLT 185 216   BMET Recent Labs    01/17/24 1208 01/17/24 1710 01/18/24 0513  NA 138  --  136  K 4.2  --  3.8  CL 104  --  104  CO2 24  --  20*  GLUCOSE 85  --  131*  BUN 12  --  10  CREATININE 0.50 0.41* 0.48  CALCIUM 9.6  --  9.4   PT/INR No results for input(s): LABPROT, INR in the last 72 hours. CMP     Component Value Date/Time   NA 136 01/18/2024 0513   K 3.8 01/18/2024 0513   CL 104 01/18/2024 0513   CO2 20 (L) 01/18/2024 0513   GLUCOSE 131 (H) 01/18/2024 0513   BUN 10 01/18/2024 0513   CREATININE 0.48 01/18/2024 0513   CALCIUM 9.4 01/18/2024 0513   PROT 7.2 01/18/2024 0513   ALBUMIN 4.2 01/18/2024 0513   AST 37 01/18/2024 0513   ALT 71 (H) 01/18/2024 0513   ALKPHOS 88 01/18/2024 0513   BILITOT 0.5  01/18/2024 0513   GFRNONAA >60 01/18/2024 0513   GFRAA >60 06/26/2019 1833   Lipase     Component Value Date/Time   LIPASE 23 01/17/2024 1208       Studies/Results: US  Abdomen Limited RUQ (LIVER/GB) Result Date: 01/17/2024 CLINICAL DATA:  Right upper quadrant abdominal pain EXAM: ULTRASOUND ABDOMEN LIMITED RIGHT UPPER QUADRANT COMPARISON:  CT abdomen and pelvis 11/19/2008 FINDINGS: Gallbladder: Cholelithiasis with large stone in the gallbladder measuring 2.4 cm. Moderate gallbladder wall thickening measuring 4.8 mm. No pericholecystic edema. Murphy's sign is negative. Common bile duct: Diameter: 4 mm, normal Liver: No focal lesion identified. Within normal limits in parenchymal echogenicity. Portal vein is patent on color Doppler imaging with normal direction of blood flow towards the liver. Other: None. IMPRESSION: 1. Cholelithiasis with moderate gallbladder wall thickening. This could indicate acute cholecystitis in the appropriate clinical setting although Murphy's sign is negative. 2. No bile duct dilatation.  Normal appearance of the liver. Electronically Signed   By: Elsie Gravely M.D.   On: 01/17/2024 16:05    Anti-infectives: Anti-infectives (From admission, onward)    Start     Dose/Rate Route Frequency Ordered Stop   01/18/24 1000  cefTRIAXone  (ROCEPHIN ) 2 g in sodium chloride  0.9 % 100 mL IVPB  Status:  Discontinued  2 g 200 mL/hr over 30 Minutes Intravenous Daily 01/17/24 1644 01/18/24 1608   01/17/24 1630  cefTRIAXone  (ROCEPHIN ) 2 g in sodium chloride  0.9 % 100 mL IVPB        2 g 200 mL/hr over 30 Minutes Intravenous  Once 01/17/24 1629 01/17/24 1739        Assessment/Plan Acute cholecystitis  POD1 s/p laparoscopic cholecystectomy   - pt reports poor pain control overnight - pain in throat, R shoulder, RUQ and umbilicus - ambulating and passing a small amount of flatus - nausea associated with pain meds - check labs this AM - adjusted pain control  -  will recheck this afternoon for possible DC but may need another day   FEN: reg diet, IVF@40  cc/h VTE: LMWH ID: Rocephin  10/7>10/8    LOS: 0 days      Burnard JONELLE Louder, [facility] 01/19/2024, 8:59 AM Please see Amion for pager number during day hours 7:00am-4:30pm

## 2024-01-19 NOTE — Progress Notes (Signed)
 Patient is stable she denies worsening abdominal soreness/discomfort or N/V. Discharge instruction reviewed, she denied questions or concerns at this time. Incisions are clean dry and intact. IV site, removal clean dry and intact

## 2024-01-19 NOTE — Discharge Summary (Signed)
 [facility] Discharge Summary   Patient ID: [name] MRN: [id number] DOB/AGE: 09/19/1990 33 y.o.  Admit date: 01/17/2024 Discharge date: 01/19/2024  Admitting Diagnosis: Acute cholecystitis   Discharge Diagnosis Acute cholecystitis   Consultants None   Imaging: US  Abdomen Limited RUQ (LIVER/GB) Result Date: 01/17/2024 CLINICAL DATA:  Right upper quadrant abdominal pain EXAM: ULTRASOUND ABDOMEN LIMITED RIGHT UPPER QUADRANT COMPARISON:  CT abdomen and pelvis 11/19/2008 FINDINGS: Gallbladder: Cholelithiasis with large stone in the gallbladder measuring 2.4 cm. Moderate gallbladder wall thickening measuring 4.8 mm. No pericholecystic edema. Murphy's sign is negative. Common bile duct: Diameter: 4 mm, normal Liver: No focal lesion identified. Within normal limits in parenchymal echogenicity. Portal vein is patent on color Doppler imaging with normal direction of blood flow towards the liver. Other: None. IMPRESSION: 1. Cholelithiasis with moderate gallbladder wall thickening. This could indicate acute cholecystitis in the appropriate clinical setting although Murphy's sign is negative. 2. No bile duct dilatation.  Normal appearance of the liver. Electronically Signed   By: Elsie Gravely M.D.   On: 01/17/2024 16:05    Procedures Dr. Deward Null (01/18/24) - Laparoscopic Cholecystectomy with ICG  Hospital Course:  Patient is a 33 year old female who presented to the ED with RUQ abdominal pain.  Workup showed acute cholecystitis.  Patient was admitted and underwent procedure listed above.  Tolerated procedure well and was transferred to the floor.  Diet was advanced as tolerated.  On POD1, the patient was voiding well, tolerating diet, ambulating well, pain well controlled, vital signs stable, incisions c/d/i and felt stable for discharge home.  Patient will follow up in our office in 3-4 weeks and knows to call with questions or concerns.   I or a member of my team  have reviewed this patient in the Controlled Substance Database.   Allergies as of 01/19/2024       Reactions   Oxycodone  Nausea Only        Medication List     TAKE these medications    acetaminophen  325 MG tablet Commonly known as: TYLENOL  Take 2 tablets (650 mg total) by mouth every 6 (six) hours as needed for mild pain (pain score 1-3) or fever.   HYDROcodone-acetaminophen  5-325 MG tablet Commonly known as: NORCO/VICODIN Take 1-2 tablets by mouth every 6 (six) hours as needed for moderate pain (pain score 4-6) or severe pain (pain score 7-10).   ibuprofen  800 MG tablet Commonly known as: ADVIL  Take 1 tablet (800 mg total) by mouth every 8 (eight) hours as needed for moderate pain (pain score 4-6). TAKE WITH FOOD What changed:  medication strength how much to take when to take this reasons to take this additional instructions   methocarbamol  500 MG tablet Commonly known as: ROBAXIN  Take 1 tablet (500 mg total) by mouth every 8 (eight) hours as needed for muscle spasms (post-operative pain).   Nexplanon 68 MG Impl implant Generic drug: etonogestrel 1 each by Subdermal route once.   ondansetron  4 MG disintegrating tablet Commonly known as: ZOFRAN -ODT Take 1 tablet (4 mg total) by mouth every 6 (six) hours as needed for nausea.   ONE-A-DAY WOMENS PO Take 1 tablet by mouth daily with breakfast.   phentermine 37.5 MG tablet Commonly known as: ADIPEX-P Take 37.5 mg by mouth every morning.   terconazole  0.4 % vaginal cream Commonly known as: TERAZOL 7  Place 1 applicator vaginally at bedtime.   Vitamin D3 Ultra Potency 1250 MCG Tabs Generic drug: Cholecalciferol Take 50,000 Units by mouth  every Monday.          Follow-up Information     Maczis, Puja Gosai, PA-C. Go on 02/14/2024.   Specialty: General Surgery Why: 9:15 AM. Please arrive 30 min prior to appointment time to complete check in paperwork. Contact information: [address] [place] [address] [facility] [place] [address] [phone]                 Signed: Burnard JONELLE Louder , [facility] 01/19/2024, 2:52 PM Please see Amion for pager number during day hours 7:00am-4:30pm

## 2024-01-19 NOTE — Progress Notes (Signed)
 Patient encouraged to ambulate. She request RN/NT to follow up for ambulation. Pt has ambulated to the restroom x 2.
